# Patient Record
Sex: Female | Born: 1965 | Race: Black or African American | Hispanic: No | Marital: Married | Smoking: Never smoker
Health system: Southern US, Community
[De-identification: ages and names within clinical notes are randomized; demographics above are authoritative.]

## PROBLEM LIST (undated history)

## (undated) DIAGNOSIS — Z9889 Other specified postprocedural states: Secondary | ICD-10-CM

## (undated) DIAGNOSIS — R112 Nausea with vomiting, unspecified: Secondary | ICD-10-CM

## (undated) DIAGNOSIS — M1990 Unspecified osteoarthritis, unspecified site: Secondary | ICD-10-CM

## (undated) DIAGNOSIS — R519 Headache, unspecified: Secondary | ICD-10-CM

## (undated) DIAGNOSIS — F419 Anxiety disorder, unspecified: Secondary | ICD-10-CM

## (undated) DIAGNOSIS — R7303 Prediabetes: Secondary | ICD-10-CM

## (undated) DIAGNOSIS — D759 Disease of blood and blood-forming organs, unspecified: Secondary | ICD-10-CM

## (undated) DIAGNOSIS — I82409 Acute embolism and thrombosis of unspecified deep veins of unspecified lower extremity: Secondary | ICD-10-CM

## (undated) DIAGNOSIS — I2699 Other pulmonary embolism without acute cor pulmonale: Secondary | ICD-10-CM

## (undated) HISTORY — PX: OTHER SURGICAL HISTORY: SHX169

## (undated) HISTORY — PX: IVC FILTER INSERTION: CATH118245

## (undated) HISTORY — PX: CHOLECYSTECTOMY: SHX55

## (undated) HISTORY — PX: WRIST SURGERY: SHX841

---

## 1997-03-24 ENCOUNTER — Inpatient Hospital Stay (HOSPITAL_COMMUNITY): Admission: AD | Admit: 1997-03-24 | Discharge: 1997-03-24 | Payer: Self-pay | Admitting: Obstetrics & Gynecology

## 1997-03-26 ENCOUNTER — Inpatient Hospital Stay (HOSPITAL_COMMUNITY): Admission: AD | Admit: 1997-03-26 | Discharge: 1997-03-28 | Payer: Self-pay | Admitting: Obstetrics and Gynecology

## 1998-12-17 ENCOUNTER — Ambulatory Visit (HOSPITAL_COMMUNITY): Admission: RE | Admit: 1998-12-17 | Discharge: 1998-12-17 | Payer: Self-pay | Admitting: *Deleted

## 1998-12-17 ENCOUNTER — Encounter: Payer: Self-pay | Admitting: *Deleted

## 1999-08-19 ENCOUNTER — Ambulatory Visit (HOSPITAL_COMMUNITY): Admission: RE | Admit: 1999-08-19 | Discharge: 1999-08-19 | Payer: Self-pay | Admitting: *Deleted

## 1999-08-19 ENCOUNTER — Encounter: Payer: Self-pay | Admitting: *Deleted

## 2000-12-12 ENCOUNTER — Other Ambulatory Visit: Admission: RE | Admit: 2000-12-12 | Discharge: 2000-12-12 | Payer: Self-pay | Admitting: Obstetrics and Gynecology

## 2001-12-12 ENCOUNTER — Other Ambulatory Visit: Admission: RE | Admit: 2001-12-12 | Discharge: 2001-12-12 | Payer: Self-pay | Admitting: Obstetrics and Gynecology

## 2002-12-26 ENCOUNTER — Other Ambulatory Visit: Admission: RE | Admit: 2002-12-26 | Discharge: 2002-12-26 | Payer: Self-pay | Admitting: Obstetrics and Gynecology

## 2003-05-17 ENCOUNTER — Ambulatory Visit (HOSPITAL_COMMUNITY): Admission: RE | Admit: 2003-05-17 | Discharge: 2003-05-17 | Payer: Self-pay | Admitting: Obstetrics and Gynecology

## 2003-09-10 ENCOUNTER — Encounter: Admission: RE | Admit: 2003-09-10 | Discharge: 2003-09-10 | Payer: Self-pay | Admitting: Obstetrics and Gynecology

## 2003-10-04 ENCOUNTER — Ambulatory Visit (HOSPITAL_COMMUNITY): Admission: RE | Admit: 2003-10-04 | Discharge: 2003-10-04 | Payer: Self-pay | Admitting: Obstetrics and Gynecology

## 2003-10-15 ENCOUNTER — Inpatient Hospital Stay (HOSPITAL_COMMUNITY): Admission: AD | Admit: 2003-10-15 | Discharge: 2003-10-17 | Payer: Self-pay | Admitting: *Deleted

## 2004-01-07 ENCOUNTER — Other Ambulatory Visit: Admission: RE | Admit: 2004-01-07 | Discharge: 2004-01-07 | Payer: Self-pay | Admitting: Obstetrics and Gynecology

## 2005-02-19 ENCOUNTER — Other Ambulatory Visit: Admission: RE | Admit: 2005-02-19 | Discharge: 2005-02-19 | Payer: Self-pay | Admitting: Obstetrics and Gynecology

## 2006-02-01 HISTORY — PX: CHOLECYSTECTOMY: SHX55

## 2010-01-02 ENCOUNTER — Ambulatory Visit: Payer: Self-pay | Admitting: Obstetrics and Gynecology

## 2010-06-16 NOTE — Assessment & Plan Note (Signed)
Lori Klein, Lori Klein            ACCOUNT NO.:  [id number]   MEDICAL RECORD NO.:  [id number]          PATIENT TYPE:  POB   LOCATION:  [facility] at [place]         FACILITY:  [facility]   PHYSICIAN:  Caren Griffins, CNM       DATE OF BIRTH:  1966/01/21   DATE OF SERVICE:  01/02/2010                                  CLINIC NOTE   REASON FOR VISIT:  Annual GYN exam and Pap.   HISTORY:  This is a 45 year old Caucasian female G2, P2-0-0-2, who is  here for her first visit, wanting a GYN annual exam.  Of note, she plans  to get a complete physical at [facility] later this month.  She has no  problems or concerns and states her last Pap smear was normal done in  2009, and all her Paps have been normal.  She has never had a baseline  mammogram and she is not interested in changing her contraceptive method  which is condoms and rhythm and she is aware of the 80% use  effectiveness rate.  She does not regularly do breast self-exam.  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   ALLERGIES:  None.   MEDICATIONS:  None.   HEALTH CARE MAINTENANCE:  Usual childhood immunizations.  She gets  regular dental exams and care.   MENSTRUAL HISTORY:  LMP December 13, 2009.  Menses 13 x 28 x 6, medium  flow, no dysmenorrhea or metrorrhagia.   CONTRACEPTIVE HISTORY:  She has been on OCPs in the past and does not  want anything other than condoms and rhythm which we discussed, she is  aware of the alternatives.   OB HISTORY:  NSVD x2, ages are 67 and 71, both are healthy.   GYN HISTORY:  Normal Pap.  No STI history.  No endometriosis, fibroids  or ovarian cysts.   PAST MEDICAL HISTORY:  Completely negative.   SURGERIES:  In 2009, she had cholecystectomy, never had a blood  transfusions.   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  Lives with husband and 2 children, in a mutually monogamous  relationship.   Nonsmoker.  Occasional alcohol.  No history of illicit  drug use or abuse.   FAMILY HISTORY:  Mother and maternal grandparents have high blood  pressure.   REVIEW OF SYSTEMS:  Completely negative.  She specifically denies any  dyspareunia, vaginal irritation or discharge, urinary symptoms, or  abdominal pain.   PHYSICAL EXAMINATION:  GENERAL:  WN/WD, in NAD.  VITAL SIGNS:  Pulse 66, BP 111/71, weight 169, height 5 feet 7 inches.  NECK:  Thyroid smooth, nonenlarged.  HEENT:  Normocephalic.  Good dentition.  LUNGS:  CTA bilateral.  HEART:  RRR without murmur noted.  ABDOMEN:  Soft, flat, nontender.  PELVIC:  NEFG.  BUS negative.  Vagina pink, rugated.  Minimal  physiologic discharge.  Cervix clean, multiparous os.  Pap done.  Bimanual, uterus NSSP.  No adnexal tenderness or masses.   ASSESSMENT:  Normal gynecologic exam.   PLAN:  We will schedule a baseline mammogram and call her with results  of the Pap.  Discussed healthy lifestyle, exercise, and suggested a  multivitamin with calcium and vitamin D.  Plan is to return for annual  exams yearly.           ______________________________  Caren Griffins, CNM     DP/MEDQ  D:  01/02/2010  T:  01/02/2010  Job:  [id number]

## 2010-06-19 NOTE — H&P (Signed)
NAME:  Lori Klein, Lori Klein                      ACCOUNT NO.:  [id number]   MEDICAL RECORD NO.:  [id number]                   PATIENT TYPE:  INP   LOCATION:  9198                                 FACILITY:  [facility]   PHYSICIAN:  Janine Limbo, M.D.            DATE OF BIRTH:  1965/08/25   DATE OF ADMISSION:  10/15/2003  DATE OF DISCHARGE:                                HISTORY & PHYSICAL   HISTORY OF PRESENT ILLNESS:  Lori Klein is a 45 year old gravida 2, para 1-  0-0-1 at 39-3/7 weeks, EDD October 20, 2003 by LMP dates, confirmed with  early ultrasound on March 22, 2003.  This patient presents to the office  of [facility] in early active labor.  Her exam is 5-cm dilated, 90% effaced at a  minus 2 station with membranes intact.  She is contracting regularly, every  5 minutes.  The fetal heart rate is in the 150s.  Patient is stable and she  is to be directly admitted to [facility] in labor.  She does not  report any PIH symptoms; no headache, visual changes or epigastric pain.  Her pregnancy has been followed by the C.N.M. service at [facility] and is  remarkable for:  #1 - Advanced maternal age, patient declined amniocentesis  and quad screen; her 18-week ultrasound was within normal.  #2 - Sickle cell  trait.  The patient had had a monthly urine screen and these have all been  within normal limits.  #3 - The patient does have a history of mitral valve  prolapse with mild regurgitation and she does take antibiotics  prophylactically.  #4 - She is group B strep-negative.  This patient entered  prenatal care on March 08, 2003 at approximately 7 weeks' gestation, [facility]  confirmed by early ultrasound at 10 weeks 3 days.  The patient declined any  genetic testing due to advanced maternal age.  She declined amniocentesis.  She had an ultrasound for anatomy at [facility] at 18  weeks which was within normal limits.  Her pregnancy otherwise was  complicated with increased  1-hour glucose challenge; the patient did have 3-  hour GTT performed with 1 elevated value.  She had a fasting blood sugar and  2-hour p.c. on September 06, 2003 at 33-5/7 weeks; fasting blood sugar was 88;  two-hour p.c. was 187.  At that point, the patient underwent nutritional  counseling with [facility] for elevated blood  sugars.  Per notes by [facility] on September 20, 2003 at 35-6/7  weeks' gestation regarding patient's glucose management, she showed 3-hour  GTT with 1 abnormal value, now taking and following diet since September 10, 2003.  The patient's fasting blood sugars have all been within the 80s to  90s, two-hour blood sugars have been mostly less than 120, although past  supper, her blood sugars have been 135 to 150s, therefore glucose  intolerance.  The patient will continue diet and testing, but she may  maintain chair with C.N.M.s.  At 36-5/7 weeks, on September 27, 2003, note  states that all the patient's fasting blood sugars have been less than 90;  three of her 2-hour blood sugars were greater than 120 and those were 131,  122 and 135.  She had an ultrasound for interval growth and size greater  than dates; this was performed on October 04, 2003 at 37 weeks and 5 days  at [facility].  Baby was noted to be in a cephalic  presentation, normal AFI of 15.6 cm, measurements found estimated fetal  weight at 3371, in the 50th to 75th percentile.  On October 04, 2003, at 37-  5/7 weeks, fasting blood sugars 85-95 and 2-hour p.c. blood sugar 84-147,  most between 120-130.  At 38 weeks, when the patient presented to the  office, her fasting blood sugars were between 74 and 76, and her 2-hour  blood sugars between 69 and 142.  She has been slightly size-greater-than-  dates throughout her pregnancy.  She has been normotensive with no  proteinuria.  Monthly Chem-9 for urine screen has been within normal limits.  She has had no  urinary tract infections with this pregnancy.   PERTINENT LABORATORY WORK:  Prenatal lab work on March 22, 2003:  Hemoglobin and hematocrit 13.6 and 39.3, platelets 352,000.  Blood type and  Rh -- B-positive, antibody screen negative.  Sickle cell trait positive.  VDRL nonreactive.  Rubella immune.  Hepatitis B surface antigen negative.  Pap smear normal, November 2004.  GC and Chlamydia were negative with this  pregnancy.  At 28 weeks, 1-hour glucose challenge elevated and described  above.  At 36 weeks, culture of the vaginal tract is negative for group B  strep.   OBSTETRICAL HISTORY:  In 1999, patient had a vacuum-assisted vaginal  delivery; she gave birth to an 8-pound 1-ounce female infant at 82 weeks.  The infant did spend 2 days in the NICU, but does not have any residual  problems.   MEDICAL HISTORY:  Medical history is significant for mitral valve prolapse  with positive echo and mild regurgitation per the patient's report.  Patient  also is noted to have sickle cell trait.   FAMILY HISTORY:  Maternal grandmother with hypertension, patient's sister  with juvenile-onset diabetes, maternal aunt with breast cancer.   GENETIC HISTORY:  Mitral valve prolapse and sickle cell trait in the  patient, otherwise, unremarkable.   SOCIAL HISTORY:  Lori Klein is a married African American female.  She is  45 years old and works in the [employer].  Her husband,  Ray Glacken, is involved and supportive; he is a Designer, industrial/product.  They  do not subscribe to a religious affiliation.   ALLERGIES:  The patient has no known drug allergies.   HABITS:  She denies the use of tobacco, alcohol or illicit drugs.   REVIEW OF SYSTEMS:  Review of systems are as described above.  The patient  is typical of one with a uterine pregnancy at term, in early active labor.  PHYSICAL EXAM:  VITAL SIGNS:  Vital signs stable, afebrile.  The patient's  blood pressure is 110/70.  She  has no proteinuria.  HEENT:  Unremarkable.  HEART:  Regular rate and rhythm.  LUNGS:  Clear.  ABDOMEN:  Abdomen is gravid in its contour.  Uterine fundus is noted to  extend 39 cm above the level  of the pubic symphysis.  Leopold's maneuver  finds the infant to be a longitudinal lie, cephalic presentation and the  estimated fetal weight is 8 pounds.  Doppler:  Fetal heart tones are in the  150s.  PELVIC:  Digital exam of the cervix finds it to be 5-cm dilated, 90% effaced  with the cephalic presenting part at a -2 station and membranes intact.  EXTREMITIES:  Extremities show no pathologic edema.  DTR are 1+ with no  clonus.   ASSESSMENT:  1.  Intrauterine pregnancy at term, in early active labor.  2.  Mitral valve prolapse with mild regurgitation.  3.  Glucose intolerance.     Rica Koyanagi, C.N.M.               Janine Limbo, M.D.    SDM/MEDQ  D:  10/15/2003  T:  10/15/2003  Job:  [id number]

## 2014-06-18 ENCOUNTER — Ambulatory Visit: Payer: Self-pay | Admitting: Obstetrics & Gynecology

## 2015-02-02 HISTORY — PX: OTHER SURGICAL HISTORY: SHX169

## 2015-04-15 ENCOUNTER — Emergency Department (HOSPITAL_COMMUNITY)
Admission: EM | Admit: 2015-04-15 | Discharge: 2015-04-15 | Disposition: A | Discharge status: Home / Self Care | Payer: Federal, State, Local not specified - PPO | Attending: Emergency Medicine | Admitting: Emergency Medicine

## 2015-04-15 ENCOUNTER — Encounter (HOSPITAL_COMMUNITY): Payer: Self-pay

## 2015-04-15 DIAGNOSIS — Y9389 Activity, other specified: Secondary | ICD-10-CM | POA: Insufficient documentation

## 2015-04-15 DIAGNOSIS — M62838 Other muscle spasm: Secondary | ICD-10-CM

## 2015-04-15 DIAGNOSIS — S161XXA Strain of muscle, fascia and tendon at neck level, initial encounter: Secondary | ICD-10-CM | POA: Diagnosis not present

## 2015-04-15 DIAGNOSIS — Y92410 Unspecified street and highway as the place of occurrence of the external cause: Secondary | ICD-10-CM | POA: Diagnosis not present

## 2015-04-15 DIAGNOSIS — T148XXA Other injury of unspecified body region, initial encounter: Secondary | ICD-10-CM

## 2015-04-15 DIAGNOSIS — M542 Cervicalgia: Secondary | ICD-10-CM

## 2015-04-15 DIAGNOSIS — Z79899 Other long term (current) drug therapy: Secondary | ICD-10-CM | POA: Diagnosis not present

## 2015-04-15 DIAGNOSIS — Y998 Other external cause status: Secondary | ICD-10-CM | POA: Diagnosis not present

## 2015-04-15 DIAGNOSIS — S199XXA Unspecified injury of neck, initial encounter: Secondary | ICD-10-CM | POA: Diagnosis present

## 2015-04-15 MED ORDER — NAPROXEN 500 MG PO TABS: 500.0000 mg | ORAL_TABLET | Freq: Two times a day (BID) | ORAL | Status: DC | PRN

## 2015-04-15 MED ORDER — CYCLOBENZAPRINE HCL 10 MG PO TABS: 10.0000 mg | ORAL_TABLET | Freq: Three times a day (TID) | ORAL | Status: DC | PRN
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
## 2015-09-26 ENCOUNTER — Encounter (HOSPITAL_COMMUNITY): Payer: Self-pay | Admitting: Emergency Medicine

## 2015-09-26 ENCOUNTER — Emergency Department (HOSPITAL_COMMUNITY)
Admit: 2015-09-26 | Discharge: 2015-09-26 | Disposition: A | Discharge status: Home / Self Care | Payer: Federal, State, Local not specified - PPO

## 2015-09-26 ENCOUNTER — Emergency Department (HOSPITAL_COMMUNITY): Payer: Federal, State, Local not specified - PPO

## 2015-09-26 ENCOUNTER — Inpatient Hospital Stay (HOSPITAL_COMMUNITY)
Admission: EM | Admit: 2015-09-26 | Discharge: 2015-09-27 | DRG: 299 | Disposition: A | Discharge status: Home / Self Care | Payer: Federal, State, Local not specified - PPO | Attending: Internal Medicine | Admitting: Internal Medicine

## 2015-09-26 DIAGNOSIS — Z79899 Other long term (current) drug therapy: Secondary | ICD-10-CM

## 2015-09-26 DIAGNOSIS — M7989 Other specified soft tissue disorders: Secondary | ICD-10-CM | POA: Diagnosis not present

## 2015-09-26 DIAGNOSIS — I82441 Acute embolism and thrombosis of right tibial vein: Secondary | ICD-10-CM | POA: Diagnosis present

## 2015-09-26 DIAGNOSIS — I82401 Acute embolism and thrombosis of unspecified deep veins of right lower extremity: Secondary | ICD-10-CM | POA: Diagnosis present

## 2015-09-26 DIAGNOSIS — I82431 Acute embolism and thrombosis of right popliteal vein: Principal | ICD-10-CM | POA: Diagnosis present

## 2015-09-26 DIAGNOSIS — I2699 Other pulmonary embolism without acute cor pulmonale: Secondary | ICD-10-CM

## 2015-09-26 DIAGNOSIS — Z793 Long term (current) use of hormonal contraceptives: Secondary | ICD-10-CM

## 2015-09-26 DIAGNOSIS — I82491 Acute embolism and thrombosis of other specified deep vein of right lower extremity: Secondary | ICD-10-CM | POA: Diagnosis present

## 2015-09-26 LAB — CBC WITH DIFFERENTIAL/PLATELET
Basophils Absolute: 0 10*3/uL (ref 0.0–0.1)
Basophils Relative: 0 %
Eosinophils Absolute: 0.2 10*3/uL (ref 0.0–0.7)
Eosinophils Relative: 2 %
HCT: 37.3 % (ref 36.0–46.0)
Hemoglobin: 13.1 g/dL (ref 12.0–15.0)
Lymphocytes Relative: 23 %
Lymphs Abs: 2.4 10*3/uL (ref 0.7–4.0)
MCH: 29.1 pg (ref 26.0–34.0)
MCHC: 35.1 g/dL (ref 30.0–36.0)
MCV: 82.9 fL (ref 78.0–100.0)
Monocytes Absolute: 0.6 10*3/uL (ref 0.1–1.0)
Monocytes Relative: 6 %
Neutro Abs: 7.1 10*3/uL (ref 1.7–7.7)
Neutrophils Relative %: 69 %
Platelets: 366 10*3/uL (ref 150–400)
RBC: 4.5 MIL/uL (ref 3.87–5.11)
RDW: 12.4 % (ref 11.5–15.5)
WBC: 10.3 10*3/uL (ref 4.0–10.5)

## 2015-09-26 LAB — URINALYSIS, ROUTINE W REFLEX MICROSCOPIC
Bilirubin Urine: NEGATIVE
Glucose, UA: NEGATIVE mg/dL
Ketones, ur: NEGATIVE mg/dL
Leukocytes, UA: NEGATIVE
Nitrite: NEGATIVE
Protein, ur: NEGATIVE mg/dL
Specific Gravity, Urine: 1.01 (ref 1.005–1.030)
pH: 6 (ref 5.0–8.0)

## 2015-09-26 LAB — COMPREHENSIVE METABOLIC PANEL
ALT: 19 U/L (ref 14–54)
AST: 23 U/L (ref 15–41)
Albumin: 3.6 g/dL (ref 3.5–5.0)
Alkaline Phosphatase: 38 U/L (ref 38–126)
Anion gap: 4 — ABNORMAL LOW (ref 5–15)
BUN: 14 mg/dL (ref 6–20)
CO2: 27 mmol/L (ref 22–32)
Calcium: 9.1 mg/dL (ref 8.9–10.3)
Chloride: 108 mmol/L (ref 101–111)
Creatinine, Ser: 0.97 mg/dL (ref 0.44–1.00)
GFR calc Af Amer: >60 mL/min (ref >60)
GFR calc non Af Amer: >60 mL/min (ref >60)
Glucose, Bld: 105 mg/dL — ABNORMAL HIGH (ref 65–99)
Potassium: 3.9 mmol/L (ref 3.5–5.1)
Sodium: 139 mmol/L (ref 135–145)
Total Bilirubin: 0.4 mg/dL (ref 0.3–1.2)
Total Protein: 7.2 g/dL (ref 6.5–8.1)

## 2015-09-26 LAB — PROTIME-INR
INR: 1.16
PROTHROMBIN TIME: 14.9 s (ref 11.4–15.2)

## 2015-09-26 LAB — URINE MICROSCOPIC-ADD ON

## 2015-09-26 LAB — PREGNANCY, URINE: Preg Test, Ur: NEGATIVE

## 2015-09-26 LAB — TROPONIN I

## 2015-09-26 LAB — APTT: aPTT: 30 seconds (ref 24–36)

## 2015-09-26 MED ORDER — IOPAMIDOL (ISOVUE-370) INJECTION 76%
100.0000 mL | Freq: Once | INTRAVENOUS | Status: AC | PRN
  Administered 2015-09-26: 100 mL via INTRAVENOUS

## 2015-09-26 MED ORDER — HEPARIN (PORCINE) IN NACL 100-0.45 UNIT/ML-% IJ SOLN
1200.0000 [IU]/h | INTRAMUSCULAR | Status: DC
  Administered 2015-09-26: 1200 [IU]/h via INTRAVENOUS
  Filled 2015-09-26: qty 250

## 2015-09-26 MED ORDER — HEPARIN BOLUS VIA INFUSION
2000.0000 [IU] | Freq: Once | INTRAVENOUS | Status: AC
  Administered 2015-09-26: 2000 [IU] via INTRAVENOUS
  Filled 2015-09-26: qty 2000

## 2015-09-26 NOTE — Progress Notes (Addendum)
Pt states pcp is Yevonne Paxlison [facility] EP[facility] updated

## 2015-09-26 NOTE — ED Provider Notes (Signed)
[facility]-EMERGENCY DEPT Provider Note   CSN: [id number] Arrival date & time: 09/26/15  1556     History   Chief Complaint Chief Complaint  Patient presents with  . Leg Swelling    HPI Lori Klein is a 50 y.o. female.  HPI Patient presents to the emergency department with right lower leg swelling.  She was seen by her primary care doctor who sent her to the emergency department for further evaluation of possible DVT.  The patient states that she does take an oral contraceptive pill.  She states she does not have any chronic medical problems.  Patient states that the swelling is been progressing over the last 3 weeks.  She does have mid calf tenderness.  The patient states that she did not take any medications prior to arrivalThe patient denies chest pain, shortness of breath, headache,blurred vision, neck pain, fever, cough, weakness, numbness, dizziness, anorexia, abdominal pain, nausea, vomiting, diarrhea, rash, back pain, dysuria, hematemesis, bloody stool, near syncope, or syncope. History reviewed. No pertinent past medical history.  There are no active problems to display for this patient.   Past Surgical History:  Procedure Laterality Date  . CHOLECYSTECTOMY    . ulnar surgery      OB History    No data available       Home Medications    Prior to Admission medications   Medication Sig Start Date End Date Taking? Authorizing Provider  Biotin 10 MG CAPS Take 10 mg by mouth daily.   Yes Historical Provider, MD  Multiple Vitamin (MULTIVITAMIN WITH MINERALS) TABS tablet Take 1 tablet by mouth daily.   Yes Historical Provider, MD  [facility] IvRMELLA 1/35 tablet Take 1 tablet by mouth daily. 04/14/15  Yes Historical Provider, MD  pyridOXINE (VITAMIN B-6) 100 MG tablet Take 100 mg by mouth daily.   Yes Historical Provider, MD  vitamin B-12 (CYANOCOBALAMIN) 1000 MCG tablet Take 1,000 mcg by mouth daily.   Yes Historical Provider, MD  cyclobenzaprine (FLEXERIL) 10 MG tablet Take 1  tablet (10 mg total) by mouth 3 (three) times daily as needed for muscle spasms. Patient not taking: Reported on 09/26/2015 04/15/15   Mercedes Camprubi-Soms, PA-C  naproxen (NAPROSYN) 500 MG tablet Take 1 tablet (500 mg total) by mouth 2 (two) times daily as needed for mild pain, moderate pain or headache (TAKE WITH MEALS.). Patient not taking: Reported on 09/26/2015 04/15/15   Mercedes Camprubi-Soms, PA-C    Family History History reviewed. No pertinent family history.  Social History Social History  Substance Use Topics  . Smoking status: Not on file  . Smokeless tobacco: Not on file  . Alcohol use Yes     Comment: occasionally     Allergies   Review of patient's allergies indicates no known allergies.   Review of Systems Review of Systems All other systems negative except as documented in the HPI. All pertinent positives and negatives as reviewed in the HPI.  Physical Exam Updated Vital Signs BP 113/78 (BP Location: Left Arm)   Pulse 73   Temp 98.2 F (36.8 C) (Oral)   Resp 16   LMP 09/09/2015   SpO2 100%   Physical Exam  Constitutional: She is oriented to person, place, and time. She appears well-developed and well-nourished. No distress.  HENT:  Head: Normocephalic and atraumatic.  Mouth/Throat: Oropharynx is clear and moist.  Eyes: Pupils are equal, round, and reactive to light.  Neck: Normal range of motion. Neck supple.  Cardiovascular: Normal rate, regular rhythm and  normal heart sounds.  Exam reveals no gallop and no friction rub.   No murmur heard. Pulmonary/Chest: Effort normal and breath sounds normal. No respiratory distress. She has no wheezes.  Abdominal: Soft. Bowel sounds are normal. She exhibits no distension. There is no tenderness.  Musculoskeletal: She exhibits edema.       Right lower leg: She exhibits tenderness, swelling and edema. She exhibits no bony tenderness, no deformity and no laceration.  Neurological: She is alert and oriented to  person, place, and time. She exhibits normal muscle tone. Coordination normal.  Skin: Skin is warm and dry. No rash noted. No erythema.  Psychiatric: She has a normal mood and affect. Her behavior is normal.  Nursing note and vitals reviewed.    ED Treatments / Results  Labs (all labs ordered are listed, but only abnormal results are displayed) Labs Reviewed  COMPREHENSIVE METABOLIC PANEL - Abnormal; Notable for the following:       Result Value   Glucose, Bld 105 (*)    Anion gap 4 (*)    All other components within normal limits  URINALYSIS, ROUTINE W REFLEX MICROSCOPIC (NOT AT [facility]) - Abnormal; Notable for the following:    Hgb urine dipstick SMALL (*)    All other components within normal limits  URINE MICROSCOPIC-ADD ON - Abnormal; Notable for the following:    Squamous Epithelial / LPF 0-5 (*)    Bacteria, UA RARE (*)    All other components within normal limits  CBC WITH DIFFERENTIAL/PLATELET  PREGNANCY, URINE    EKG  EKG Interpretation None       Radiology Dg Chest 2 View  Result Date: 09/26/2015 CLINICAL DATA:  Bilateral leg swelling off and on for 2 weeks. EXAM: CHEST  2 VIEW COMPARISON:  None. FINDINGS: Heart and mediastinal contours are within normal limits. No focal opacities or effusions. No acute bony abnormality. IMPRESSION: No active cardiopulmonary disease. Electronically Signed   By: Charlett Nose M.D.   On: 09/26/2015 16:57    Procedures Procedures (including critical care time)  Medications Ordered in ED Medications - No data to display   Initial Impression / Assessment and Plan / ED Course  I have reviewed the triage vital signs and the nursing notes.  Pertinent labs & imaging results that were available during my care of the patient were reviewed by me and considered in my medical decision making (see chart for details).  Clinical Course    Patient does have a DVT noted in the right leg.  The patient will have a CT scan to further assess  for any PE.  Patient does have bilateral PEs and will be treating with heparin.  Patient is advised of the results and all questions were answered  Final Clinical Impressions(s) / ED Diagnoses   Final diagnoses:  None    New Prescriptions New Prescriptions   No medications on file     Charlestine Night, PA-C 09/26/15 2043    Charlestine Night, PA-C 09/26/15 2132    Samuel Jester, DO 09/27/15 1547

## 2015-09-26 NOTE — H&P (Signed)
Lori Klein ZOX:[id number] DOB: [birth year] DOA: 09/26/2015     PCP: Christ Kick, PA   Outpatient Specialists: none Patient coming from:    home Lives   With family    Chief Complaint: Leg pain  HPI: Lori Klein is a 50 y.o. female with medical history significant of no significant past medical history    Presented with right intermittent lower leg pain and swelling for the past week she was evaluated for this by her primary care provider and was sent to emergency department for evaluation of DVT. Patient denies any chest pain or shortness of breath has not had any recent right has no history of cancer she is on oral contraceptives denies any fevers or chills no lightheadedness no nausea vomiting no diarrhea no rash no blood in stool no black stools no syncope. No recent travel she did travel in beginning of July but has been symptomatic only for the past one week prior. Reports last mammogram was in November and was normal and no history of cancer no family history of blood clots     IN ER:  Temp (24hrs), Avg:98.2 F (36.8 C), Min:98.2 F (36.8 C), Max:98.2 F (36.8 C)  Heart rate 78 blood pressure 140/91   Doppler showed right femoral vein popliteal vein test chronic veins posterior tibial veins and peroneal veins DVT CT scan showed bilateral pulmonary embolism Following Medications were ordered in ER: Medications  heparin bolus via infusion 2,000 Units (not administered)  heparin ADULT infusion 100 units/mL (25000 units/286mL sodium chloride 0.45%) (not administered)  iopamidol (ISOVUE-370) 76 % injection 100 mL (100 mLs Intravenous Contrast Given 09/26/15 2043)      Hospitalist was called for admission for DVT and PE  Review of Systems:    Pertinent positives include: Right leg swelling and pain  Constitutional:  No weight loss, night sweats, Fevers, chills, fatigue, weight loss  HEENT:  No headaches, Difficulty swallowing,Tooth/dental problems,Sore  throat,  No sneezing, itching, ear ache, nasal congestion, post nasal drip,  Cardio-vascular:  No chest pain, Orthopnea, PND, anasarca, dizziness, palpitations.no Bilateral lower extremity swelling  GI:  No heartburn, indigestion, abdominal pain, nausea, vomiting, diarrhea, change in bowel habits, loss of appetite, melena, blood in stool, hematemesis Resp:  no shortness of breath at rest. No dyspnea on exertion, No excess mucus, no productive cough, No non-productive cough, No coughing up of blood.No change in color of mucus.No wheezing. Skin:  no rash or lesions. No jaundice GU:  no dysuria, change in color of urine, no urgency or frequency. No straining to urinate.  No flank pain.  Musculoskeletal:  No joint pain or no joint swelling. No decreased range of motion. No back pain.  Psych:  No change in mood or affect. No depression or anxiety. No memory loss.  Neuro: no localizing neurological complaints, no tingling, no weakness, no double vision, no gait abnormality, no slurred speech, no confusion  As per HPI otherwise 10 point review of systems negative.   Past Medical History: History reviewed. No pertinent past medical history. Past Surgical History:  Procedure Laterality Date  . CHOLECYSTECTOMY    . ulnar surgery       Social History:  Ambulatory   Independently     reports that she drinks alcohol. Her tobacco and drug histories are not on file.  Allergies:  No Known Allergies     Family History:   Family History  Problem Relation Age of Onset  . Hypertension Mother   .  Hypertension Father   . Diabetes Other     Medications: Prior to Admission medications   Medication Sig Start Date End Date Taking? Authorizing Provider  Biotin 10 MG CAPS Take 10 mg by mouth daily.   Yes Historical Provider, MD  Multiple Vitamin (MULTIVITAMIN WITH MINERALS) TABS tablet Take 1 tablet by mouth daily.   Yes Historical Provider, MD  [facility] Take 1 tablet by mouth  daily. 04/14/15  Yes Historical Provider, MD  pyridOXINE (VITAMIN B-6) 100 MG tablet Take 100 mg by mouth daily.   Yes Historical Provider, MD  vitamin B-12 (CYANOCOBALAMIN) 1000 MCG tablet Take 1,000 mcg by mouth daily.   Yes Historical Provider, MD  cyclobenzaprine (FLEXERIL) 10 MG tablet Take 1 tablet (10 mg total) by mouth 3 (three) times daily as needed for muscle spasms. Patient not taking: Reported on 09/26/2015 04/15/15   Mercedes Camprubi-Soms, PA-C  naproxen (NAPROSYN) 500 MG tablet Take 1 tablet (500 mg total) by mouth 2 (two) times daily as needed for mild pain, moderate pain or headache (TAKE WITH MEALS.). Patient not taking: Reported on 09/26/2015 04/15/15   Allen DerryMercedes Camprubi-Soms, PA-C    Physical Exam: Patient Vitals for the past 24 hrs:  BP Temp Temp src Pulse Resp SpO2 Height Weight  09/26/15 2131 - - - - - - 5\' 7"  (1.702 m) 71.2 kg (157 lb)  09/26/15 1929 113/78 - - 73 16 100 % - -  09/26/15 1611 140/91 98.2 F (36.8 C) Oral 78 16 99 % - -    1. General:  in No Acute distress 2. Psychological: Alert and Oriented 3. Head/ENT:    Dry Mucous Membranes                          Head Non traumatic, neck supple                          Normal   Dentition 4. SKIN: normal  Skin turgor,  Skin clean Dry and intact no rash 5. Heart: Regular rate and rhythm no Murmur, Rub or gallop 6. Lungs:  Clear to auscultation bilaterally, no wheezes or crackles   7. Abdomen: Soft,  non-tender, Non distended 8. Lower extremities: no clubbing, cyanosis, right leg edema   9. Neurologically Grossly intact, moving all 4 extremities equally  10. MSK: Normal range of motion   body mass index is 24.59 kg/m.  Labs on Admission:   Labs on Admission: I have personally reviewed following labs and imaging studies  CBC:  Recent Labs Lab 09/26/15 1708  WBC 10.3  NEUTROABS 7.1  HGB 13.1  HCT 37.3  MCV 82.9  PLT 366   Basic Metabolic Panel:  Recent Labs Lab 09/26/15 1708  NA 139  K 3.9    CL 108  CO2 27  GLUCOSE 105*  BUN 14  CREATININE 0.97  CALCIUM 9.1   GFR: Estimated Creatinine Clearance: 69 mL/min (by C-G formula based on SCr of 0.97 mg/dL). Liver Function Tests:  Recent Labs Lab 09/26/15 1708  AST 23  ALT 19  ALKPHOS 38  BILITOT 0.4  PROT 7.2  ALBUMIN 3.6   No results for input(s): LIPASE, AMYLASE in the last 168 hours. No results for input(s): AMMONIA in the last 168 hours. Coagulation Profile: No results for input(s): INR, PROTIME in the last 168 hours. Cardiac Enzymes: No results for input(s): CKTOTAL, CKMB, CKMBINDEX, TROPONINI in the last 168 hours.  BNP (last 3 results) No results for input(s): PROBNP in the last 8760 hours. HbA1C: No results for input(s): HGBA1C in the last 72 hours. CBG: No results for input(s): GLUCAP in the last 168 hours. Lipid Profile: No results for input(s): CHOL, HDL, LDLCALC, TRIG, CHOLHDL, LDLDIRECT in the last 72 hours. Thyroid Function Tests: No results for input(s): TSH, T4TOTAL, FREET4, T3FREE, THYROIDAB in the last 72 hours. Anemia Panel: No results for input(s): VITAMINB12, FOLATE, FERRITIN, TIBC, IRON, RETICCTPCT in the last 72 hours. Urine analysis:    Component Value Date/Time   COLORURINE YELLOW 09/26/2015 1843   APPEARANCEUR CLEAR 09/26/2015 1843   LABSPEC 1.010 09/26/2015 1843   PHURINE 6.0 09/26/2015 1843   GLUCOSEU NEGATIVE 09/26/2015 1843   HGBUR SMALL (A) 09/26/2015 1843   BILIRUBINUR NEGATIVE 09/26/2015 1843   KETONESUR NEGATIVE 09/26/2015 1843   PROTEINUR NEGATIVE 09/26/2015 1843   NITRITE NEGATIVE 09/26/2015 1843   LEUKOCYTESUR NEGATIVE 09/26/2015 1843   Sepsis Labs: @LABRCNTIP (procalcitonin:4,lacticidven:4) )No results found for this or any previous visit (from the past 240 hour(s)).     UA   no evidence of UTI    No results found for: HGBA1C  Estimated Creatinine Clearance: 69 mL/min (by C-G formula based on SCr of 0.97 mg/dL).  BNP (last 3 results) No results for input(s):  PROBNP in the last 8760 hours.   ECG REPORT ordered  [facility] Weights   09/26/15 2131  Weight: 71.2 kg (157 lb)     Cultures: No results found for: SDES, SPECREQUEST, CULT, REPTSTATUS   Radiological Exams on Admission: Dg Chest 2 View  Result Date: 09/26/2015 CLINICAL DATA:  Bilateral leg swelling off and on for 2 weeks. EXAM: CHEST  2 VIEW COMPARISON:  None. FINDINGS: Heart and mediastinal contours are within normal limits. No focal opacities or effusions. No acute bony abnormality. IMPRESSION: No active cardiopulmonary disease. Electronically Signed   By: Charlett Nose M.D.   On: 09/26/2015 16:57   Ct Angio Chest Pe W And/or Wo Contrast  Result Date: 09/26/2015 CLINICAL DATA:  Short of breath, RIGHT lower extremity swelling. EXAM: CT ANGIOGRAPHY CHEST WITH CONTRAST TECHNIQUE: Multidetector CT imaging of the chest was performed using the standard protocol during bolus administration of intravenous contrast. Multiplanar CT image reconstructions and MIPs were obtained to evaluate the vascular anatomy. CONTRAST:  100 mL Isovue COMPARISON:  None. FINDINGS: Cardiovascular: Tubular filling defects within the proximal and segmental branches of the RIGHT lower lobe pulmonary arteries consistent pulmonary emboli. These emboli are partially occlusive. Smaller tubular filling defects within the LEFT lower lobe pulmonary arteries which are also partially occlusive. Small lingular embolism noted. Small RIGHT upper lobe emboli additionally. The RIGHT ventricular ratio to LEFT ventricular ratio muscle is less than 1 inconsistent with RIGHT heart strain. Mediastinum/Nodes: No axillary or supraclavicular lymphadenopathy. No mediastinal adenopathy. Lungs/Pleura: no pulmonary infarction. Upper Abdomen: Limited view of the liver, kidneys, pancreas are unremarkable. Normal adrenal glands. Musculoskeletal: No aggressive osseous lesion. Review of the MIP images confirms the above findings. IMPRESSION: 1. Bilateral lower  lobe acute pulmonary emboli. The overall clot burden is moderate. Emboli are more occlusive in the RIGHT lower lobe than LEFT lower lobe. 2. No CT evidence of RIGHT ventricular strain. Critical Value/emergent results were called by telephone at the time of interpretation on 09/26/2015 at 9:22 pm to Dr. Charlestine Night , who verbally acknowledged these results. Electronically Signed   By: Genevive Bi M.D.   On: 09/26/2015 21:23    Chart has been reviewed  Assessment/Plan  50 y.o. female with medical history significant of no significant past medical history on oral contraceptives being admitted for DVT complicated by bilateral PV sat evidence of hemodynamic instability  Present on Admission: . Pulmonary embolism (HCC) in a setting of birth control use. Obtain echo and a shave heparin drip switched to appropriate newer anticoagulation agents ordered case manager consult . DVT (deep venous thrombosis) (HCC) - we'll need to discontinue oral contraception and will need to be on anticoagulation for this 3 months     Other plan as per orders.  DVT prophylaxis:  Heparin  Code Status:  FULL CODE   per patient    Family Communication:   Family    at  Bedside  plan of care was discussed with  Husband Lori Klein [phone], Mother Lori Klein [phone]  Disposition Plan:   To home once workup is complete and patient is stable     Consults called: none    Admission status:   Inpatient    Level of care       tele      I have spent a total of on this admission    , 09/27/2015, 12:39 AM    [facility]  Pager [phone]   after 2 AM please page floor coverage PA If 7AM-7PM, please contact the day team taking care of the patient  [email or web address]  Password [email or web address]

## 2015-09-26 NOTE — ED Triage Notes (Signed)
Pt reports intermittent R lower leg swelling for the past week. Pain with flexion only. Was sent here by PCP to rule out DVT. No sob/cp.

## 2015-09-26 NOTE — Progress Notes (Signed)
ANTICOAGULATION CONSULT NOTE - Initial Consult  Pharmacy Consult for IV heparin Indication: PE/DVT  No Known Allergies  Patient Measurements: Height: 5\' 7"  (170.2 cm) Weight: 157 lb (71.2 kg) IBW/kg (Calculated) : 61.6 Heparin Dosing Weight: TBW  Vital Signs: Temp: 98.2 F (36.8 C) (08/25 1611) Temp Source: Oral (08/25 1611) BP: 113/78 (08/25 1929) Pulse Rate: 73 (08/25 1929)  Labs:  Recent Labs  09/26/15 1708  HGB 13.1  HCT 37.3  PLT 366  CREATININE 0.97    Estimated Creatinine Clearance: 69 mL/min (by C-G formula based on SCr of 0.97 mg/dL).   Medical History: History reviewed. No pertinent past medical history.  Assessment: [age] yoF with no PMH but on oral contraceptive presents with RLE swelling; found to have extensive DVT and PE   Baseline INR, aPTT: pending  Prior anticoagulation: none  Significant events:  Today, 09/26/2015:  CBC: wnl  No bleeding or infusion issues per nursing  CrCl: 69 ml/min  Goal of Therapy: Heparin level 0.3-0.7 units/ml Monitor platelets by anticoagulation protocol: Yes  Plan:  Heparin 2000 units IV bolus x 1  Heparin 1200 units/hr IV infusion  Check heparin level 6 hrs after start  Daily CBC, daily heparin level once stable  Monitor for signs of bleeding or thrombosis   Bernadene Personrew , PharmD Pager: [phone] 09/26/2015, 9:45 PM

## 2015-09-26 NOTE — Progress Notes (Signed)
*  PRELIMINARY RESULTS* Vascular Ultrasound Lower extremity venous duplex has been completed.  Preliminary findings:  DVT noted in the Right femoral vein, popliteal vein, gastroc veins, posterior tibial veins, and peroneal veins.  Per protocol, LLE duplex was also performed, with no evidence of DVT. Left baker's cyst noted.   Gave results to student working with Dr. Clarene DukeMcManus.   Farrel DemarkJill Eunice, RDMS, RVT  09/26/2015, 6:08 PM

## 2015-09-27 ENCOUNTER — Encounter (HOSPITAL_COMMUNITY): Payer: Self-pay | Admitting: Internal Medicine

## 2015-09-27 ENCOUNTER — Inpatient Hospital Stay (HOSPITAL_COMMUNITY): Payer: Federal, State, Local not specified - PPO

## 2015-09-27 DIAGNOSIS — I82491 Acute embolism and thrombosis of other specified deep vein of right lower extremity: Secondary | ICD-10-CM | POA: Diagnosis present

## 2015-09-27 DIAGNOSIS — Z793 Long term (current) use of hormonal contraceptives: Secondary | ICD-10-CM | POA: Diagnosis not present

## 2015-09-27 DIAGNOSIS — M7989 Other specified soft tissue disorders: Secondary | ICD-10-CM | POA: Diagnosis present

## 2015-09-27 DIAGNOSIS — I2699 Other pulmonary embolism without acute cor pulmonale: Secondary | ICD-10-CM

## 2015-09-27 DIAGNOSIS — Z79899 Other long term (current) drug therapy: Secondary | ICD-10-CM | POA: Diagnosis not present

## 2015-09-27 DIAGNOSIS — I82401 Acute embolism and thrombosis of unspecified deep veins of right lower extremity: Secondary | ICD-10-CM | POA: Diagnosis not present

## 2015-09-27 DIAGNOSIS — I82441 Acute embolism and thrombosis of right tibial vein: Secondary | ICD-10-CM | POA: Diagnosis present

## 2015-09-27 DIAGNOSIS — I82431 Acute embolism and thrombosis of right popliteal vein: Secondary | ICD-10-CM | POA: Diagnosis present

## 2015-09-27 LAB — CBC
HEMATOCRIT: 37.1 % (ref 36.0–46.0)
HEMOGLOBIN: 12.9 g/dL (ref 12.0–15.0)
MCH: 28.9 pg (ref 26.0–34.0)
MCHC: 34.8 g/dL (ref 30.0–36.0)
MCV: 83.2 fL (ref 78.0–100.0)
Platelets: 373 10*3/uL (ref 150–400)
RBC: 4.46 MIL/uL (ref 3.87–5.11)
RDW: 12.5 % (ref 11.5–15.5)
WBC: 9 10*3/uL (ref 4.0–10.5)

## 2015-09-27 LAB — COMPREHENSIVE METABOLIC PANEL
ALK PHOS: 37 U/L — AB (ref 38–126)
ALT: 18 U/L (ref 14–54)
AST: 21 U/L (ref 15–41)
Albumin: 3.3 g/dL — ABNORMAL LOW (ref 3.5–5.0)
Anion gap: 6 (ref 5–15)
BILIRUBIN TOTAL: 0.6 mg/dL (ref 0.3–1.2)
BUN: 12 mg/dL (ref 6–20)
CALCIUM: 8.7 mg/dL — AB (ref 8.9–10.3)
CO2: 26 mmol/L (ref 22–32)
CREATININE: 0.85 mg/dL (ref 0.44–1.00)
Chloride: 110 mmol/L (ref 101–111)
GFR calc Af Amer: >60 mL/min (ref >60)
GFR calc non Af Amer: >60 mL/min (ref >60)
GLUCOSE: 101 mg/dL — AB (ref 65–99)
Potassium: 3.5 mmol/L (ref 3.5–5.1)
SODIUM: 142 mmol/L (ref 135–145)
TOTAL PROTEIN: 6.7 g/dL (ref 6.5–8.1)

## 2015-09-27 LAB — MAGNESIUM: MAGNESIUM: 2 mg/dL (ref 1.7–2.4)

## 2015-09-27 LAB — HEPARIN LEVEL (UNFRACTIONATED): HEPARIN UNFRACTIONATED: 0.53 [IU]/mL (ref 0.30–0.70)

## 2015-09-27 LAB — TSH: TSH: 3.679 u[IU]/mL (ref 0.350–4.500)

## 2015-09-27 LAB — PHOSPHORUS: Phosphorus: 3.6 mg/dL (ref 2.5–4.6)

## 2015-09-27 MED ORDER — ACETAMINOPHEN 325 MG PO TABS: 650.0000 mg | ORAL_TABLET | Freq: Four times a day (QID) | ORAL | Status: DC | PRN

## 2015-09-27 MED ORDER — APIXABAN 5 MG PO TABS: 5.0000 mg | ORAL_TABLET | Freq: Two times a day (BID) | ORAL | 0 refills | Status: DC

## 2015-09-27 MED ORDER — APIXABAN 5 MG PO TABS
10.0000 mg | ORAL_TABLET | Freq: Two times a day (BID) | ORAL | Status: DC
  Administered 2015-09-27: 10 mg via ORAL
  Filled 2015-09-27: qty 2

## 2015-09-27 MED ORDER — ONDANSETRON HCL 4 MG/2ML IJ SOLN: 4.0000 mg | Freq: Four times a day (QID) | INTRAMUSCULAR | Status: DC | PRN

## 2015-09-27 MED ORDER — ONDANSETRON HCL 4 MG PO TABS: 4.0000 mg | ORAL_TABLET | Freq: Four times a day (QID) | ORAL | Status: DC | PRN

## 2015-09-27 MED ORDER — APIXABAN 5 MG PO TABS: 5.0000 mg | ORAL_TABLET | Freq: Two times a day (BID) | ORAL | Status: DC

## 2015-09-27 MED ORDER — APIXABAN 5 MG PO TABS
10.0000 mg | ORAL_TABLET | Freq: Two times a day (BID) | ORAL | 0 refills | Status: DC
Start: 2015-09-27 — End: 2019-05-31

## 2015-09-27 MED ORDER — SODIUM CHLORIDE 0.9% FLUSH
3.0000 mL | Freq: Two times a day (BID) | INTRAVENOUS | Status: DC
  Administered 2015-09-27 (×2): 3 mL via INTRAVENOUS

## 2015-09-27 MED ORDER — HYDROCODONE-ACETAMINOPHEN 5-325 MG PO TABS: 1.0000 | ORAL_TABLET | ORAL | Status: DC | PRN

## 2015-09-27 MED ORDER — ACETAMINOPHEN 650 MG RE SUPP: 650.0000 mg | Freq: Four times a day (QID) | RECTAL | Status: DC | PRN

## 2015-09-27 NOTE — Care Management Note (Signed)
Case Management Note  Patient Details  Name: Lori Klein MRN: [id number] Date of Birth: [birth year]  Subjective/Objective:                  PE/DVT Action/Plan: Discharge planning  Expected Discharge Date:  09/27/15              Expected Discharge Plan:  Home/Self Care  In-House Referral:     Discharge planning Services  CM Consult  Post Acute Care Choice:    Choice offered to:  Patient  DME Arranged:    DME Agency:     HH Arranged:    HH Agency:     Status of Service:  Completed, signed off  If discussed at [employer] Length of Stay Meetings, dates discussed:    Additional Comments: CM spoke with pt (remotely) and pt states she already has a free 30 day trial card.  Pt verbalized understanding card will pay for today's discharge prescription and give insurance enough for authorization to cover the refills.  No other CM needs were communicated. Yves DillJeffries,  Christine, RN 09/27/2015, 12:51 PM

## 2015-09-27 NOTE — Discharge Summary (Signed)
Physician Discharge Summary  Lori Klein [id number] DOB: Jul 20, 1965 DOA: 09/26/2015  PCP: Christ KickErvin,Alison M, PA  Admit date: 09/26/2015 Discharge date: 09/27/2015  Recommendations for Outpatient Follow-up:  1. Follow up with PCP in 1 week.  2. Patient being discharged on Eliquis for acute RLE DVT and bilateral PE. She should be off oral contraceptives indefinitely.  3. Duration for treatment with anticoagulation is 3 months. Please check doppler RLE after completion of treatment to evaluate residual clots.  Home Health: NO Equipment/Devices: NONE  Discharge Condition:Stable CODE STATUS: Full code Diet recommendation: Regular    Discharge Diagnoses:  Principal Problem:   Acute deep vein thrombosis (DVT) of right lower extremity (HCC)  Active Problems:   Bilateral pulmonary embolism (HCC)  Brief narrative/history of present illness   50 year old female on oral contraceptives for several years presented with right lower leg swelling for the past 2 weeks. Swelling was mainly in her ankle and calf. On some days she had difficulty bending her right knee. She denies any trauma to the leg, pain, tingling or numbness, fever, chills, headache, blurred vision, chest pain, shortness of breath, diaphoresis, palpitations, abdominal pain, nausea or vomiting. Denies bowel or urinary symptoms. In the ED vitals were stable. Blood work done was unremarkable with Doppler of her right lower extremity showing acute DVT in the right femoral, popliteal, posterior tibial and peroneal veins. CT angiogram of the chest done showed bilateral moderate pulmonary emboli without right heart strain. Patient started on IV heparin and admitted to hospitalist service.  Discharge Diagnoses:  Principal Problem:   Acute deep vein thrombosis (DVT) of right lower extremity (HCC) Suspect secondary to oral contraceptives. Patient started on IV heparin. She is hemodynamically stable and does not have any leg pain,  chest pain or shortness of breath. I have started her on Eliquis with duration of treatment for at least 3 months. After completion of treatment she would need a Doppler of her right lower imaging to evaluate for any residual clot burden.  Active Problems:   Bilateral pulmonary embolism (HCC) Asymptomatic. CT chest rules out right heart strain. Does not need 2-D echo.   Patient stable to be discharged home. She is given clear instruction on monitoring any complicating symptoms of her underlying DVT and PE and to return to the ED immediately if she has any symptoms. She was also given options for anticoagulation and shows to be on newer oral anticoagulant. Patient explained both verbally and with written instructions on benefits and side effects of anticoagulation.  She should follow-up with her PCP in 1 week.   CODE STATUS: Full code  Family clinical indication: Friend at bedside  Disposition: Home   Discharge Instructions     Medication List    STOP taking these medications   cyclobenzaprine 10 MG tablet Commonly known as:  FLEXERIL   naproxen 500 MG tablet Commonly known as:  NAPROSYN     TAKE these medications   apixaban 5 MG Tabs tablet Commonly known as:  ELIQUIS Take 2 tablets (10 mg total) by mouth 2 (two) times daily.   apixaban 5 MG Tabs tablet Commonly known as:  ELIQUIS Take 1 tablet (5 mg total) by mouth 2 (two) times daily. Start taking on:  10/04/2015   Biotin 10 MG Caps Take 10 mg by mouth daily.   multivitamin with minerals Tabs tablet Take 1 tablet by mouth daily.   PIRMELLA 1/35 tablet Generic drug:  norethindrone-ethinyl estradiol 1/35 Take 1 tablet by mouth daily.  pyridOXINE 100 MG tablet Commonly known as:  VITAMIN B-6 Take 100 mg by mouth daily.   vitamin B-12 1000 MCG tablet Commonly known as:  CYANOCOBALAMIN Take 1,000 mcg by mouth daily.      Follow-up Information    [place], [place]. Schedule an appointment as soon as  possible for a visit in 1 week(s).   Contact information: [address] [place] [address] [phone]          No Known Allergies    Procedures/Studies: Dg Chest 2 View  Result Date: 09/26/2015 CLINICAL DATA:  Bilateral leg swelling off and on for 2 weeks. EXAM: CHEST  2 VIEW COMPARISON:  None. FINDINGS: Heart and mediastinal contours are within normal limits. No focal opacities or effusions. No acute bony abnormality. IMPRESSION: No active cardiopulmonary disease. Electronically Signed   By: Charlett Nose M.D.   On: 09/26/2015 16:57   Ct Angio Chest Pe W And/or Wo Contrast  Result Date: 09/26/2015 CLINICAL DATA:  Short of breath, RIGHT lower extremity swelling. EXAM: CT ANGIOGRAPHY CHEST WITH CONTRAST TECHNIQUE: Multidetector CT imaging of the chest was performed using the standard protocol during bolus administration of intravenous contrast. Multiplanar CT image reconstructions and MIPs were obtained to evaluate the vascular anatomy. CONTRAST:  100 mL Isovue COMPARISON:  None. FINDINGS: Cardiovascular: Tubular filling defects within the proximal and segmental branches of the RIGHT lower lobe pulmonary arteries consistent pulmonary emboli. These emboli are partially occlusive. Smaller tubular filling defects within the LEFT lower lobe pulmonary arteries which are also partially occlusive. Small lingular embolism noted. Small RIGHT upper lobe emboli additionally. The RIGHT ventricular ratio to LEFT ventricular ratio muscle is less than 1 inconsistent with RIGHT heart strain. Mediastinum/Nodes: No axillary or supraclavicular lymphadenopathy. No mediastinal adenopathy. Lungs/Pleura: no pulmonary infarction. Upper Abdomen: Limited view of the liver, kidneys, pancreas are unremarkable. Normal adrenal glands. Musculoskeletal: No aggressive osseous lesion. Review of the MIP images confirms the above findings. IMPRESSION: 1. Bilateral lower lobe acute pulmonary emboli. The overall  clot burden is moderate. Emboli are more occlusive in the RIGHT lower lobe than LEFT lower lobe. 2. No CT evidence of RIGHT ventricular strain. Critical Value/emergent results were called by telephone at the time of interpretation on 09/26/2015 at 9:22 pm to Dr. Charlestine Night , who verbally acknowledged these results. Electronically Signed   By: Genevive Bi M.D.   On: 09/26/2015 21:23       Subjective: Denies any leg pain, chest pain or shortness of breath.  Discharge Exam: Vitals:   09/27/15 0240 09/27/15 0542  BP: 120/74 115/79  Pulse: 69 86  Resp: 18 18  Temp: 98.5 F (36.9 C) 98.5 F (36.9 C)   Vitals:   09/27/15 0130 09/27/15 0200 09/27/15 0240 09/27/15 0542  BP: 117/78 102/88 120/74 115/79  Pulse: 73 68 69 86  Resp: 18 18 18 18   Temp:   98.5 F (36.9 C) 98.5 F (36.9 C)  TempSrc:   Oral Oral  SpO2: 99% 98% 98% 98%  Weight:   70.7 kg (155 lb 13.8 oz)   Height:   5\' 7"  (1.702 m)     General: Middle aged female not in distress HEENT: No pallor, moist mucosa, supple neck Cardiovascular: RRR, S1/S2 +, no rubs, no gallops Respiratory: CTA bilaterally, no wheezing, no rhonchi Abdominal: Soft, NT, ND, bowel sounds + Extremities: Warm, swelling of  lower right lower, nontender     The results of significant diagnostics from this hospitalization (including imaging,  microbiology, ancillary and laboratory) are listed below for reference.     Microbiology: No results found for this or any previous visit (from the past 240 hour(s)).   Labs: BNP (last 3 results) No results for input(s): BNP in the last 8760 hours. Basic Metabolic Panel:  Recent Labs Lab 09/26/15 1708 09/27/15 0350  NA 139 142  K 3.9 3.5  CL 108 110  CO2 27 26  GLUCOSE 105* 101*  BUN 14 12  CREATININE 0.97 0.85  CALCIUM 9.1 8.7*  MG  --  2.0  PHOS  --  3.6   Liver Function Tests:  Recent Labs Lab 09/26/15 1708 09/27/15 0350  AST 23 21  ALT 19 18  ALKPHOS 38 37*  BILITOT 0.4  0.6  PROT 7.2 6.7  ALBUMIN 3.6 3.3*   No results for input(s): LIPASE, AMYLASE in the last 168 hours. No results for input(s): AMMONIA in the last 168 hours. CBC:  Recent Labs Lab 09/26/15 1708 09/27/15 0350  WBC 10.3 9.0  NEUTROABS 7.1  --   HGB 13.1 12.9  HCT 37.3 37.1  MCV 82.9 83.2  PLT 366 373   Cardiac Enzymes:  Recent Labs Lab 09/26/15 2228  TROPONINI <0.03   BNP: Invalid input(s): POCBNP CBG: No results for input(s): GLUCAP in the last 168 hours. D-Dimer No results for input(s): DDIMER in the last 72 hours. Hgb A1c No results for input(s): HGBA1C in the last 72 hours. Lipid Profile No results for input(s): CHOL, HDL, LDLCALC, TRIG, CHOLHDL, LDLDIRECT in the last 72 hours. Thyroid function studies  Recent Labs  09/27/15 0350  TSH 3.679   Anemia work up No results for input(s): VITAMINB12, FOLATE, FERRITIN, TIBC, IRON, RETICCTPCT in the last 72 hours. Urinalysis    Component Value Date/Time   COLORURINE YELLOW 09/26/2015 1843   APPEARANCEUR CLEAR 09/26/2015 1843   LABSPEC 1.010 09/26/2015 1843   PHURINE 6.0 09/26/2015 1843   GLUCOSEU NEGATIVE 09/26/2015 1843   HGBUR SMALL (A) 09/26/2015 1843   BILIRUBINUR NEGATIVE 09/26/2015 1843   KETONESUR NEGATIVE 09/26/2015 1843   PROTEINUR NEGATIVE 09/26/2015 1843   NITRITE NEGATIVE 09/26/2015 1843   LEUKOCYTESUR NEGATIVE 09/26/2015 1843   Sepsis Labs Invalid input(s): PROCALCITONIN,  WBC,  LACTICIDVEN Microbiology No results found for this or any previous visit (from the past 240 hour(s)).   Time coordinating discharge: < 30 minutes  SIGNED:   Eddie North, MD  [facility] 09/27/2015, 1:09 PM Pager   If 7PM-7AM, please contact night-coverage [email or web address] Password [email or web address]

## 2015-09-27 NOTE — Progress Notes (Signed)
ANTICOAGULATION CONSULT NOTE - Consult  Pharmacy Consult for IV heparin Indication: PE/DVT  No Known Allergies  Patient Measurements: Height: 5\' 7"  (170.2 cm) Weight: 155 lb 13.8 oz (70.7 kg) IBW/kg (Calculated) : 61.6 Heparin Dosing Weight: TBW  Vital Signs: Temp: 98.5 F (36.9 C) (08/26 0240) Temp Source: Oral (08/26 0240) BP: 120/74 (08/26 0240) Pulse Rate: 69 (08/26 0240)  Labs:  Recent Labs  09/26/15 1708 09/26/15 2204 09/26/15 2228 09/27/15 0350  HGB 13.1  --   --  12.9  HCT 37.3  --   --  37.1  PLT 366  --   --  373  APTT  --  30  --   --   LABPROT  --  14.9  --   --   INR  --  1.16  --   --   HEPARINUNFRC  --   --   --  0.53  CREATININE 0.97  --   --  0.85  TROPONINI  --   --  <0.03  --     Estimated Creatinine Clearance: 78.7 mL/min (by C-G formula based on SCr of 0.85 mg/dL).   Medical History: History reviewed. No pertinent past medical history.  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

## 2015-09-27 NOTE — Discharge Instructions (Addendum)
Apixaban oral tablets °What is this medicine? °APIXABAN (a PIX a ban) is an anticoagulant (blood thinner). It is used to lower the chance of stroke in people with a medical condition called atrial fibrillation. It is also used to treat or prevent blood clots in the lungs or in the veins. °This medicine may be used for other purposes; ask your health care provider or pharmacist if you have questions. °What should I tell my health care provider before I take this medicine? °They need to know if you have any of these conditions: °-bleeding disorders °-bleeding in the brain °-blood in your stools (black or tarry stools) or if you have blood in your vomit °-history of stomach bleeding °-kidney disease °-liver disease °-mechanical heart valve °-an unusual or allergic reaction to apixaban, other medicines, foods, dyes, or preservatives °-pregnant or trying to get pregnant °-breast-feeding °How should I use this medicine? °Take this medicine by mouth with a glass of water. Follow the directions on the prescription label. You can take it with or without food. If it upsets your stomach, take it with food. Take your medicine at regular intervals. Do not take it more often than directed. Do not stop taking except on your doctor's advice. Stopping this medicine may increase your risk of a blot clot. Be sure to refill your prescription before you run out of medicine. °Talk to your pediatrician regarding the use of this medicine in children. Special care may be needed. °Overdosage: If you think you have taken too much of this medicine contact a poison control center or emergency room at once. °NOTE: This medicine is only for you. Do not share this medicine with others. °What if I miss a dose? °If you miss a dose, take it as soon as you can. If it is almost time for your next dose, take only that dose. Do not take double or extra doses. °What may interact with this medicine? °This medicine may interact with the following: °-aspirin  and aspirin-like medicines °-certain medicines for fungal infections like ketoconazole and itraconazole °-certain medicines for seizures like carbamazepine and phenytoin °-certain medicines that treat or prevent blood clots like warfarin, enoxaparin, and dalteparin °-clarithromycin °-NSAIDs, medicines for pain and inflammation, like ibuprofen or naproxen °-rifampin °-ritonavir °-St. John's wort °This list may not describe all possible interactions. Give your health care provider a list of all the medicines, herbs, non-prescription drugs, or dietary supplements you use. Also tell them if you smoke, drink alcohol, or use illegal drugs. Some items may interact with your medicine. °What should I watch for while using this medicine? °Notify your doctor or health care professional and seek emergency treatment if you develop breathing problems; changes in vision; chest pain; severe, sudden headache; pain, swelling, warmth in the leg; trouble speaking; sudden numbness or weakness of the face, arm, or leg. These can be signs that your condition has gotten worse. °If you are going to have surgery, tell your doctor or health care professional that you are taking this medicine. °Tell your health care professional that you use this medicine before you have a spinal or epidural procedure. Sometimes people who take this medicine have bleeding problems around the spine when they have a spinal or epidural procedure. This bleeding is very rare. If you have a spinal or epidural procedure while on this medicine, call your health care professional immediately if you have back pain, numbness or tingling (especially in your legs and feet), muscle weakness, paralysis, or loss of bladder or bowel   control. Avoid sports and activities that might cause injury while you are using this medicine. Severe falls or injuries can cause unseen bleeding. Be careful when using sharp tools or knives. Consider using an Neurosurgeon. Take special care  brushing or flossing your teeth. Report any injuries, bruising, or red spots on the skin to your doctor or health care professional. What side effects may I notice from receiving this medicine? Side effects that you should report to your doctor or health care professional as soon as possible: -allergic reactions like skin rash, itching or hives, swelling of the face, lips, or tongue -signs and symptoms of bleeding such as bloody or black, tarry stools; red or dark-brown urine; spitting up blood or brown material that looks like coffee grounds; red spots on the skin; unusual bruising or bleeding from the eye, gums, or nose This list may not describe all possible side effects. Call your doctor for medical advice about side effects. You may report side effects to FDA at 1-800-FDA-1088. Where should I keep my medicine? Keep out of the reach of children. Store at room temperature between 20 and 25 degrees C (68 and 77 degrees F). Throw away any unused medicine after the expiration date. NOTE: This sheet is a summary. It may not cover all possible information. If you have questions about this medicine, talk to your doctor, pharmacist, or health care provider.    2016, Elsevier/[employer]. (2012-09-22 11:59:24)     Pulmonary Embolism A pulmonary embolism (PE) is a sudden blockage or decrease of blood flow in one lung or both lungs. Most blockages come from a blood clot that travels from the legs or the pelvis to the lungs. PE is a dangerous and potentially life-threatening condition if it is not treated right away. CAUSES A pulmonary embolism occurs most commonly when a blood clot travels from one of your veins to your lungs. Rarely, PE is caused by air, fat, amniotic fluid, or part of a tumor traveling through your veins to your lungs. RISK FACTORS A PE is more likely to develop in:  People who smoke.  People who areolder, especially over 44 years of age.  People who are overweight  (obese).  People who sit or lie still for a long time, such as during long-distance travel (over 4 hours), bed rest, hospitalization, or during recovery from certain medical conditions like a stroke.  People who do not engage in much physical activity (sedentary lifestyle).  People who have chronic breathing disorders.  People whohave a personal or family history of blood clots or blood clotting disease.  People whohave peripheral vascular disease (PVD), diabetes, or some types of cancer.  People who haveheart disease, especially if the person had a recent heart attack or has congestive heart failure.  People who have neurological diseases that affect the legs (leg paresis).  People who have had a traumatic injury, such as breaking a hip or leg.  People whohave recently had major or lengthy surgery, especially on the hip, knee, or abdomen.  People who have hada central line placed inside a large vein.  People who takemedicines that contain the hormone estrogen. These include birth control pills and hormone replacement therapy.  Pregnancy or during childbirth or the postpartum period. SIGNS AND SYMPTOMS  The symptoms of a PE usually start suddenly and include:  Shortness of breath while active or at rest.  Coughing or coughing up blood or blood-tinged mucus.  Chest pain that is often worse with deep breaths.  Rapid or irregular heartbeat.  Feeling light-headed or dizzy.  Fainting.  Feelinganxious.  Sweating. There may also be pain and swelling in a leg if that is where the blood clot started. These symptoms may represent a serious problem that is an emergency. Do not wait to see if the symptoms will go away. Get medical help right away. Call your local emergency services (911 in the [place] Do not drive yourself to the hospital. DIAGNOSIS Your health care provider will take a medical history and perform a physical exam. You may also have other tests,  including:  Blood tests to assess the clotting properties of your blood, assess oxygen levels in your blood, and find blood clots.  Imaging tests, such as CT, ultrasound, MRI, X-ray, and other tests to see if you have clots anywhere in your body.  An electrocardiogram (ECG) to look for heart strain from blood clots in the lungs. TREATMENT The main goals of PE treatment are:  To stop a blood clot from growing larger.  To stop new blood clots from forming. The type of treatment that you receive depends on many factors, such as the cause of your PE, your risk for bleeding or developing more clots, and other medical conditions that you have. Sometimes, a combination of treatments is necessary. This condition may be treated with:  Medicines, including newer oral blood thinners (anticoagulants), warfarin, low molecular weight heparins, thrombolytics, or heparins.  Wearing compression stockings or using different types of devices.  Surgery (rare) to remove the blood clot or to place a filter in your abdomen to stop the blood clot from traveling to your lungs. Treatments for a PE are often divided into immediate treatment, long-term treatment (up to 3 months after PE), and extended treatment (more than 3 months after PE). Your treatment may continue for several months. This is called maintenance therapy, and it is used to prevent the forming of new blood clots. You can work with your health care provider to choose the treatment program that is best for you. What are anticoagulants? Anticoagulants are medicines that treat PEs. They can stop current blood clots from growing and stop new clots from forming. They cannot dissolve existing clots. Your body dissolves clots by itself over time. Anticoagulants are given by mouth, by injection, or through an IV tube. What are thrombolytics? Thrombolytics are clot-dissolving medicines that are used to dissolve a PE. They carry a high risk of bleeding, so  they tend to be used only in severe cases or if you have very low blood pressure. HOME CARE INSTRUCTIONS If you are taking a newer oral anticoagulant:  Take the medicine every single day at the same time each day.  Understand what foods and drugs interact with this medicine.  Understand that there are no regular blood tests required when using this medicine.  Understandthe side effects of this medicine, including excessive bruising or bleeding. Ask your health care provider or pharmacist about other possible side effects. If you are taking warfarin:  Understand how to take warfarin and know which foods can affect how warfarin works in [employer] relations account executive.  Understand that it is dangerous to taketoo much or too little warfarin. Too much warfarin increases the risk of bleeding. Too little warfarin continues to allow the risk for blood clots.  Follow your PT and INR blood testing schedule. The PT and INR results allow your health care provider to adjust your dose of warfarin. It is very important that you have your PT and INR  tested as often as told by your health care provider.  Avoid major changes in your diet, or tell your health care provider before you change your diet. Arrange a visit with a registered dietitian to answer your questions. Many foods, especially foods that are high in vitamin K, can interfere with warfarin and affect the PT and INR results. Eat a consistent amount of foods that are high in vitamin K, such as:  Spinach, kale, broccoli, cabbage, collard greens, turnip greens, Brussels sprouts, peas, cauliflower, seaweed, and parsley.  Beef liver and pork liver.  Green tea.  Soybean oil.  Tell your health care provider about any and all medicines, vitamins, and supplements that you take, including aspirin and other over-the-counter anti-inflammatory medicines. Be especially cautious with aspirin and anti-inflammatory medicines. Do not take those before you ask your health care  provider if it is safe to do so. This is important because many medicines can interfere with warfarin and affect the PT and INR results.  Do not start or stop taking any over-the-counter or prescription medicine unless your health care provider or pharmacist tells you to do so. If you take warfarin, you will also need to do these things:  Hold pressure over cuts for longer than usual.  Tell your dentist and other health care providers that you are taking warfarin before you have any procedures in which bleeding may occur.  Avoid alcohol or drink very small amounts. Tell your health care provider if you change your alcohol intake.  Do not use tobacco products, including cigarettes, chewing tobacco, and e-cigarettes. If you need help quitting, ask your health care provider.  Avoid contact sports. General Instructions  Take over-the-counter and prescription medicines only as told by your health care provider. Anticoagulant medicines can have side effects, including easy bruising and difficulty stopping bleeding. If you are prescribed an anticoagulant, you will also need to do these things:  Hold pressure over cuts for longer than usual.  Tell your dentist and other health care providers that you are taking anticoagulants before you have any procedures in which bleeding may occur.  Avoid contact sports.  Wear a medical alert bracelet or carry a medical alert card that says you have had a PE.  Ask your health care provider how soon you can go back to your normal activities. Stay active to prevent new blood clots from forming.  Make sure to exercise while traveling or when you have been sitting or standing for a long period of time. It is very important to exercise. Exercise your legs by walking or by tightening and relaxing your leg muscles often. Take frequent walks.  Wear compression stockings as told by your health care provider to help prevent more blood clots from forming.  Do not  use tobacco products, including cigarettes, chewing tobacco, and e-cigarettes. If you need help quitting, ask your health care provider.  Keep all follow-up appointments with your health care provider. This is important. PREVENTION Take these actions to decrease your risk of developing another PE:  Exercise regularly. For at least 30 minutes every day, engage in:  Activity that involves moving your arms and legs.  Activity that encourages good blood flow through your body by increasing your heart rate.  Exercise your arms and legs every hour during long-distance travel (over 4 hours). Drink plenty of water and avoid drinking alcohol while traveling.  Avoid sitting or lying in bed for long periods of time without moving your legs.  Maintain a weight that  is appropriate for your height. Ask your health care provider what weight is healthy for you.  If you are a woman who is over 24 years of age, avoid unnecessary use of medicines that contain estrogen. These include birth control pills.  Do not smoke, especially if you take estrogen medicines. If you need help quitting, ask your health care provider.  If you are at very high risk for PE, wear compression stockings.  If you recently had a PE, have regularly scheduled ultrasound testing on your legs to check for new blood clots. If you are hospitalized, prevention measures may include:  Early walking after surgery, as soon as your health care provider says that it is safe.  Receiving anticoagulants to prevent blood clots. If you cannot take anticoagulants, other options may be available, such as wearing compression stockings or using different types of devices. SEEK IMMEDIATE MEDICAL CARE IF:  You have new or increased pain, swelling, or redness in an arm or leg.  You have numbness or tingling in an arm or leg.  You have shortness of breath while active or at rest.  You have chest pain.  You have a rapid or irregular  heartbeat.  You feel light-headed or dizzy.  You cough up blood.  You notice blood in your vomit, bowel movement, or urine.  You have a fever. These symptoms may represent a serious problem that is an emergency. Do not wait to see if the symptoms will go away. Get medical help right away. Call your local emergency services (911 in the [place] Do not drive yourself to the hospital.   This information is not intended to replace advice given to you by your health care provider. Make sure you discuss any questions you have with your health care provider.   Document Released: 01/16/2000 Document Revised: 10/09/2014 Document Reviewed: 05/15/2014 Elsevier Interactive Patient Education 2016 [employer]    Deep Vein Thrombosis A deep vein thrombosis (DVT) is a blood clot (thrombus) that usually occurs in a deep, larger vein of the lower leg or the pelvis, or in an upper extremity such as the arm. These are dangerous and can lead to serious and even life-threatening complications if the clot travels to the lungs. A DVT can damage the valves in your leg veins so that instead of flowing upward, the blood pools in the lower leg. This is called post-thrombotic syndrome, and it can result in pain, swelling, discoloration, and sores on the leg. CAUSES A DVT is caused by the formation of a blood clot in your leg, pelvis, or arm. Usually, several things contribute to the formation of blood clots. A clot may develop when:  Your blood flow slows down.  Your vein becomes damaged in some way.  You have a condition that makes your blood clot more easily. RISK FACTORS A DVT is more likely to develop in:  People who are older, especially over 20 years of age.  People who are overweight (obese).  People who sit or lie still for a long time, such as during long-distance travel (over 4 hours), bed rest, hospitalization, or during recovery from certain medical conditions like a stroke.  People who do not  engage in much physical activity (sedentary lifestyle).  People who have chronic breathing disorders.  People who have a personal or family history of blood clots or blood clotting disease.  People who have peripheral vascular disease (PVD), diabetes, or some types of cancer.  People who have heart disease, especially if  the person had a recent heart attack or has congestive heart failure.  People who have neurological diseases that affect the legs (leg paresis).  People who have had a traumatic injury, such as breaking a hip or leg.  People who have recently had major or lengthy surgery, especially on the hip, knee, or abdomen.  People who have had a central line placed inside a large vein.  People who take medicines that contain the hormone estrogen. These include birth control pills and hormone replacement therapy.  Pregnancy or during childbirth or the postpartum period.  Long plane flights (over 8 hours). SIGNS AND SYMPTOMS Symptoms of a DVT can include:   Swelling of your leg or arm, especially if one side is much worse.  Warmth and redness of your leg or arm, especially if one side is much worse.  Pain in your arm or leg. If the clot is in your leg, symptoms may be more noticeable or worse when you stand or walk.  A feeling of pins and needles, if the clot is in the arm. The symptoms of a DVT that has traveled to the lungs (pulmonary embolism, PE) usually start suddenly and include:  Shortness of breath while active or at rest.  Coughing or coughing up blood or blood-tinged mucus.  Chest pain that is often worse with deep breaths.  Rapid or irregular heartbeat.  Feeling light-headed or dizzy.  Fainting.  Feeling anxious.  Sweating. There may also be pain and swelling in a leg if that is where the blood clot started. These symptoms may represent a serious problem that is an emergency. Do not wait to see if the symptoms will go away. Get medical help right  away. Call your local emergency services (911 in the [place] Do not drive yourself to the hospital. DIAGNOSIS Your health care provider will take a medical history and perform a physical exam. You may also have other tests, including:  Blood tests to assess the clotting properties of your blood.  Imaging tests, such as CT, ultrasound, MRI, X-ray, and other tests to see if you have clots anywhere in your body. TREATMENT After a DVT is identified, it can be treated. The type of treatment that you receive depends on many factors, such as the cause of your DVT, your risk for bleeding or developing more clots, and other medical conditions that you have. Sometimes, a combination of treatments is necessary. Treatment options may be combined and include:  Monitoring the blood clot with ultrasound.  Taking medicines by mouth, such as newer blood thinners (anticoagulants), thrombolytics, or warfarin.  Taking anticoagulant medicine by injection or through an IV tube.  Wearing compression stockings or using different types ofdevices.  Surgery (rare) to remove the blood clot or to place a filter in your abdomen to stop the blood clot from traveling to your lungs. Treatments for a DVT are often divided into immediate treatment and long-term treatment (up to 3 months after DVT). You can work with your health care provider to choose the treatment program that is best for you. HOME CARE INSTRUCTIONS If you are taking a newer oral anticoagulant:  Take the medicine every single day at the same time each day.  Understand what foods and drugs interact with this medicine.  Understand that there are no regular blood tests required when using this medicine.  Understand the side effects of this medicine, including excessive bruising or bleeding. Ask your health care provider or pharmacist about other possible side effects. If  you are taking warfarin:  Understand how to take warfarin and know which foods can  affect how warfarin works in [employer] account [employer].  Understand that it is dangerous to take too much or too little warfarin. Too much warfarin increases the risk of bleeding. Too little warfarin continues to allow the risk for blood clots.  Follow your PT and INR blood testing schedule. The PT and INR results allow your health care provider to adjust your dose of warfarin. It is very important that you have your PT and INR tested as often as told by your health care provider.  Avoid major changes in your diet, or tell your health care provider before you change your diet. Arrange a visit with a registered dietitian to answer your questions. Many foods, especially foods that are high in vitamin K, can interfere with warfarin and affect the PT and INR results. Eat a consistent amount of foods that are high in vitamin K, such as:  Spinach, kale, broccoli, cabbage, collard greens, turnip greens, Brussels sprouts, peas, cauliflower, seaweed, and parsley.  Beef liver and pork liver.  Green tea.  Soybean oil.  Tell your health care provider about any and all medicines, vitamins, and supplements that you take, including aspirin and other over-the-counter anti-inflammatory medicines. Be especially cautious with aspirin and anti-inflammatory medicines. Do not take those before you ask your health care provider if it is safe to do so. This is important because many medicines can interfere with warfarin and affect the PT and INR results.  Do not start or stop taking any over-the-counter or prescription medicine unless your health care provider or pharmacist tells you to do so. If you take warfarin, you will also need to do these things:  Hold pressure over cuts for longer than usual.  Tell your dentist and other health care providers that you are taking warfarin before you have any procedures in which bleeding may occur.  Avoid alcohol or drink very small amounts. Tell your health care provider if you change your  alcohol intake.  Do not use tobacco products, including cigarettes, chewing tobacco, and e-cigarettes. If you need help quitting, ask your health care provider.  Avoid contact sports. General Instructions  Take over-the-counter and prescription medicines only as told by your health care provider. Anticoagulant medicines can have side effects, including easy bruising and difficulty stopping bleeding. If you are prescribed an anticoagulant, you will also need to do these things:  Hold pressure over cuts for longer than usual.  Tell your dentist and other health care providers that you are taking anticoagulants before you have any procedures in which bleeding may occur.  Avoid contact sports.  Wear a medical alert bracelet or carry a medical alert card that says you have had a PE.  Ask your health care provider how soon you can go back to your normal activities. Stay active to prevent new blood clots from forming.  Make sure to exercise while traveling or when you have been sitting or standing for a long period of time. It is very important to exercise. Exercise your legs by walking or by tightening and relaxing your leg muscles often. Take frequent walks.  Wear compression stockings as told by your health care provider to help prevent more blood clots from forming.  Do not use tobacco products, including cigarettes, chewing tobacco, and e-cigarettes. If you need help quitting, ask your health care provider.  Keep all follow-up appointments with your health care provider. This is important. PREVENTION Take  these actions to decrease your risk of developing another DVT:  Exercise regularly. For at least 30 minutes every day, engage in:  Activity that involves moving your arms and legs.  Activity that encourages good blood flow through your body by increasing your heart rate.  Exercise your arms and legs every hour during long-distance travel (over 4 hours). Drink plenty of water and  avoid drinking alcohol while traveling.  Avoid sitting or lying in bed for long periods of time without moving your legs.  Maintain a weight that is appropriate for your height. Ask your health care provider what weight is healthy for you.  If you are a woman who is over 28 years of age, avoid unnecessary use of medicines that contain estrogen. These include birth control pills.  Do not smoke, especially if you take estrogen medicines. If you need help quitting, ask your health care provider. If you are hospitalized, prevention measures may include:  Early walking after surgery, as soon as your health care provider says that it is safe.  Receiving anticoagulants to prevent blood clots.If you cannot take anticoagulants, other options may be available, such as wearing compression stockings or using different types of devices. SEEK IMMEDIATE MEDICAL CARE IF:  You have new or increased pain, swelling, or redness in an arm or leg.  You have numbness or tingling in an arm or leg.  You have shortness of breath while active or at rest.  You have chest pain.  You have a rapid or irregular heartbeat.  You feel light-headed or dizzy.  You cough up blood.  You notice blood in your vomit, bowel movement, or urine. These symptoms may represent a serious problem that is an emergency. Do not wait to see if the symptoms will go away. Get medical help right away. Call your local emergency services (911 in the [place] Do not drive yourself to the hospital.   This information is not intended to replace advice given to you by your health care provider. Make sure you discuss any questions you have with your health care provider.   Document Released: 01/18/2005 Document Revised: 10/09/2014 Document Reviewed: 05/15/2014 Elsevier Interactive Patient Education 2016 [employer].  Information on my medicine - ELIQUIS (apixaban)  This medication education was reviewed with me or my healthcare  representative as part of my discharge preparation.  The pharmacist that spoke with me during my hospital stay was:  Berkley Harvey, [facility]  Why was Eliquis prescribed for you? Eliquis was prescribed to treat blood clots that may have been found in the veins of your legs (deep vein thrombosis) or in your lungs (pulmonary embolism) and to reduce the risk of them occurring again.  What do You need to know about Eliquis ? The starting dose is 10 mg (two 5 mg tablets) taken TWICE daily for the FIRST SEVEN (7) DAYS, then on (enter date)  10/04/15  the dose is reduced to ONE 5 mg tablet taken TWICE daily.  Eliquis may be taken with or without food.   Try to take the dose about the same time in the morning and in the evening. If you have difficulty swallowing the tablet whole please discuss with your pharmacist how to take the medication safely.  Take Eliquis exactly as prescribed and DO NOT stop taking Eliquis without talking to the doctor who prescribed the medication.  Stopping may increase your risk of developing a new blood clot.  Refill your prescription before you run out.  After discharge,  you should have regular check-up appointments with your healthcare provider that is prescribing your Eliquis.    What do you do if you miss a dose? If a dose of ELIQUIS is not taken at the scheduled time, take it as soon as possible on the same day and twice-daily administration should be resumed. The dose should not be doubled to make up for a missed dose.  Important Safety Information A possible side effect of Eliquis is bleeding. You should call your healthcare provider right away if you experience any of the following: ? Bleeding from an injury or your nose that does not stop. ? Unusual colored urine (red or dark brown) or unusual colored stools (red or black). ? Unusual bruising for unknown reasons. ? A serious fall or if you hit your head (even if there is no bleeding).  Some medicines  may interact with Eliquis and might increase your risk of bleeding or clotting while on Eliquis. To help avoid this, consult your healthcare provider or pharmacist prior to using any new prescription or non-prescription medications, including herbals, vitamins, non-steroidal anti-inflammatory drugs (NSAIDs) and supplements.  This website has more information on Eliquis (apixaban): http://www.eliquis.com/eliquis/home

## 2015-09-27 NOTE — Progress Notes (Signed)
ANTICOAGULATION CONSULT NOTE - Consult  Pharmacy Consult for IV heparin - transition to apixaban on 8/26 Indication: PE/DVT  No Known Allergies  Patient Measurements: Height: 5\' 7"  (170.2 cm) Weight: 155 lb 13.8 oz (70.7 kg) IBW/kg (Calculated) : 61.6 Heparin Dosing Weight: TBW  Vital Signs: Temp: 98.5 F (36.9 C) (08/26 0542) Temp Source: Oral (08/26 0542) BP: 115/79 (08/26 0542) Pulse Rate: 86 (08/26 0542)  Labs:  Recent Labs  09/26/15 1708 09/26/15 2204 09/26/15 2228 09/27/15 0350  HGB 13.1  --   --  12.9  HCT 37.3  --   --  37.1  PLT 366  --   --  373  APTT  --  30  --   --   LABPROT  --  14.9  --   --   INR  --  1.16  --   --   HEPARINUNFRC  --   --   --  0.53  CREATININE 0.97  --   --  0.85  TROPONINI  --   --  <0.03  --     Estimated Creatinine Clearance: 78.7 mL/min (by C-G formula based on SCr of 0.85 mg/dL).   Medical History: History reviewed. No pertinent past medical history.  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

## 2015-10-20 ENCOUNTER — Emergency Department (HOSPITAL_BASED_OUTPATIENT_CLINIC_OR_DEPARTMENT_OTHER)
Admit: 2015-10-20 | Discharge: 2015-10-20 | Disposition: A | Discharge status: Home / Self Care | Payer: Federal, State, Local not specified - PPO

## 2015-10-20 ENCOUNTER — Inpatient Hospital Stay (HOSPITAL_COMMUNITY): Admit: 2015-10-20 | Payer: Federal, State, Local not specified - PPO

## 2015-10-20 ENCOUNTER — Emergency Department (HOSPITAL_COMMUNITY)
Admission: EM | Admit: 2015-10-20 | Discharge: 2015-10-20 | Disposition: A | Discharge status: Home / Self Care | Payer: Federal, State, Local not specified - PPO | Attending: Emergency Medicine | Admitting: Emergency Medicine

## 2015-10-20 ENCOUNTER — Encounter (HOSPITAL_COMMUNITY): Payer: Self-pay | Admitting: *Deleted

## 2015-10-20 DIAGNOSIS — E876 Hypokalemia: Secondary | ICD-10-CM

## 2015-10-20 DIAGNOSIS — M7989 Other specified soft tissue disorders: Secondary | ICD-10-CM | POA: Diagnosis not present

## 2015-10-20 DIAGNOSIS — R600 Localized edema: Secondary | ICD-10-CM

## 2015-10-20 DIAGNOSIS — M79609 Pain in unspecified limb: Secondary | ICD-10-CM | POA: Diagnosis not present

## 2015-10-20 DIAGNOSIS — Z79899 Other long term (current) drug therapy: Secondary | ICD-10-CM | POA: Insufficient documentation

## 2015-10-20 DIAGNOSIS — I82401 Acute embolism and thrombosis of unspecified deep veins of right lower extremity: Secondary | ICD-10-CM | POA: Diagnosis not present

## 2015-10-20 HISTORY — DX: Acute embolism and thrombosis of unspecified deep veins of unspecified lower extremity: I82.409

## 2015-10-20 HISTORY — DX: Other pulmonary embolism without acute cor pulmonale: I26.99

## 2015-10-20 LAB — CBC
HEMATOCRIT: 43.2 % (ref 36.0–46.0)
Hemoglobin: 14.8 g/dL (ref 12.0–15.0)
MCH: 29.1 pg (ref 26.0–34.0)
MCHC: 34.3 g/dL (ref 30.0–36.0)
MCV: 85 fL (ref 78.0–100.0)
PLATELETS: 331 10*3/uL (ref 150–400)
RBC: 5.08 MIL/uL (ref 3.87–5.11)
RDW: 12.7 % (ref 11.5–15.5)
WBC: 11.8 10*3/uL — ABNORMAL HIGH (ref 4.0–10.5)

## 2015-10-20 LAB — BASIC METABOLIC PANEL
ANION GAP: 9 (ref 5–15)
BUN: 12 mg/dL (ref 6–20)
CALCIUM: 9.7 mg/dL (ref 8.9–10.3)
CO2: 27 mmol/L (ref 22–32)
Chloride: 105 mmol/L (ref 101–111)
Creatinine, Ser: 0.94 mg/dL (ref 0.44–1.00)
GFR calc Af Amer: >60 mL/min (ref >60)
GFR calc non Af Amer: >60 mL/min (ref >60)
GLUCOSE: 81 mg/dL (ref 65–99)
Potassium: 3.2 mmol/L — ABNORMAL LOW (ref 3.5–5.1)
Sodium: 141 mmol/L (ref 135–145)

## 2015-10-20 LAB — CK: Total CK: 90 U/L (ref 38–234)

## 2015-10-20 MED ORDER — POTASSIUM CHLORIDE CRYS ER 20 MEQ PO TBCR
40.0000 meq | EXTENDED_RELEASE_TABLET | Freq: Once | ORAL | Status: AC
  Administered 2015-10-20: 40 meq via ORAL
  Filled 2015-10-20: qty 2

## 2015-10-20 NOTE — ED Triage Notes (Addendum)
Patient was dx with a DVT and PE on 8/25 and has been taking Eliquis ever since.  Patient had a DVT in the right leg and bilateral PE.  On Saturday past, patient noticed renewed swelling in her right posterior thigh.  She denies SOB, but states she is anxious about the swelling in her leg.  Patient saw her PCP, Dr. Alysia PennaErvin this morning, and was directed here to a Venous Doppler study.

## 2015-10-20 NOTE — ED Notes (Signed)
RN placing ultasound IV.

## 2015-10-20 NOTE — ED Notes (Signed)
RN to draw labs and get iv. Patient request ultrasound stick.

## 2015-10-20 NOTE — ED Notes (Signed)
Ultrasound VE at bedside

## 2015-10-20 NOTE — ED Notes (Signed)
Urine obtained at bedside

## 2015-10-20 NOTE — Progress Notes (Addendum)
VASCULAR LAB PRELIMINARY  PRELIMINARY  PRELIMINARY  PRELIMINARY  Bilateral lower extremity venous duplex completed.    Preliminary report:  Positive for DVT from the mid to proximal posterior tibial vein, proximal peroneal, popliteal, and distal to mid femoral. No evidence of superficial thrombosis or Baker's cyst. Essentially unchanged from previous study of 8/17. Left - No evidence of DVT or superficial thrombosis. Positive for a Baker's cyst in the popliteal fossa. No changes from previous study.  , , RVS 10/20/2015, 5:46 PM

## 2015-10-20 NOTE — ED Notes (Signed)
Bed: WA08 Expected date:  Expected time:  Means of arrival:  [facility]

## 2015-10-20 NOTE — ED Provider Notes (Signed)
[facility]-EMERGENCY DEPT Provider Note   CSN: [id number] Arrival date & time: 10/20/15  1256     History   Chief Complaint Chief Complaint  Patient presents with  . Leg Swelling    HPI Lori Klein is a 50 y.o. female.  HPI 50 yo AAF with PMHx of provoked DVT/PE, recently diagnosed on 8/26, here with RLE swelling. Pt seen by PCP today nad sent for U/S. As mentioned, she was just hospitalized for DVT/PE thought 2/2 OCP use. She was placed on Eliquis and taken off OCPs. Has been recovering well. However, over past 2-3 days she has noticed increased swelling of her right leg. Denies any missed doses of eliquis, Swelling worsens throughout day and improves with elevation. She denies any significant pain but does have intermittent aching, cramping throughout leg. No numbness or tingling. No trauma. Presented to PCP who sent here for evaluation.  Past Medical History:  Diagnosis Date  . DVT (deep venous thrombosis) (HCC)   . Pulmonary embolism [facility](HCC)     Patient Active Problem List   Diagnosis Date Noted  . Bilateral pulmonary embolism (HCC) 09/27/2015  . Acute deep vein thrombosis (DVT) of right lower extremity (HCC) 09/27/2015    Past Surgical History:  Procedure Laterality Date  . CHOLECYSTECTOMY    . ulnar surgery      OB History    No data available       Home Medications    Prior to Admission medications   Medication Sig Start Date End Date Taking? Authorizing Provider  apixaban (ELIQUIS) 5 MG TABS tablet Take 1 tablet (5 mg total) by mouth 2 (two) times daily. 10/04/15 12/27/15 Yes Nishant Dhungel, MD  Multiple Vitamin (MULTIVITAMIN WITH MINERALS) TABS tablet Take 1 tablet by mouth daily.   Yes Historical Provider, MD  apixaban (ELIQUIS) 5 MG TABS tablet Take 2 tablets (10 mg total) by mouth 2 (two) times daily. Patient not taking: Reported on 10/20/2015 09/27/15 10/04/15  Theda BelfastNishant Dhungel, MD  Norethindrone-Eth Estradiol (PIRMELLA 1/35 PO) Take 1 tablet by mouth daily.     Historical Provider, MD    Family History Family History  Problem Relation Age of Onset  . Hypertension Mother   . Hypertension Father   . Diabetes Other     Social History Social History  Substance Use Topics  . Smoking status: Never Smoker  . Smokeless tobacco: Never Used  . Alcohol use Yes     Comment: occasionally     Allergies   Review of patient's allergies indicates no known allergies.   Review of Systems Review of Systems  Constitutional: Negative for chills and fever.  HENT: Negative for congestion, rhinorrhea and sore throat.   Eyes: Negative for visual disturbance.  Respiratory: Negative for cough, shortness of breath and wheezing.   Cardiovascular: Positive for leg swelling. Negative for chest pain.  Gastrointestinal: Negative for abdominal pain, diarrhea, nausea and vomiting.  Genitourinary: Negative for dysuria, flank pain, vaginal bleeding and vaginal discharge.  Musculoskeletal: Negative for neck pain.  Skin: Negative for rash.  Allergic/Immunologic: Negative for immunocompromised state.  Neurological: Negative for syncope and headaches.  Hematological: Does not bruise/bleed easily.  All other systems reviewed and are negative.    Physical Exam Updated Vital Signs BP 119/81 (BP Location: Left Arm)   Pulse 61   Temp 97.6 F (36.4 C) (Oral)   Resp 16   Ht 5\' 7"  (1.702 m)   Wt 154 lb (69.9 kg)   LMP 09/29/2015   SpO2 99%  BMI 24.12 kg/m   Physical Exam  Constitutional: She is oriented to person, place, and time. She appears well-developed and well-nourished. No distress.  HENT:  Head: Normocephalic and atraumatic.  Eyes: Conjunctivae are normal.  Neck: Neck supple.  Cardiovascular: Normal rate, regular rhythm and normal heart sounds.  Exam reveals no friction rub.   No murmur heard. Pulmonary/Chest: Effort normal and breath sounds normal. No respiratory distress. She has no wheezes. She has no rales.  Abdominal: She exhibits no  distension.  Musculoskeletal: She exhibits no edema.  Neurological: She is alert and oriented to person, place, and time. She exhibits normal muscle tone.  Skin: Skin is warm. Capillary refill takes less than 2 seconds.  Psychiatric: She has a normal mood and affect.  Nursing note and vitals reviewed.   LOWER EXTREMITY EXAM: BILATERAL  INSPECTION & PALPATION: No appreciable swelling on inspection or palpation. Minimal calf TTP on right, no palpable cords. No redness or erythema. No drainage. No bruising.  SENSORY: sensation is intact to light touch in:  Superficial peroneal nerve distribution (over dorsum of foot) Deep peroneal nerve distribution (over first dorsal web space) Sural nerve distribution (over lateral aspect 5th metatarsal) Saphenous nerve distribution (over medial instep)  MOTOR:  + Motor EHL (great toe dorsiflexion) + FHL (great toe plantar flexion)  + TA (ankle dorsiflexion)  + GSC (ankle plantar flexion)  VASCULAR: 2+ dorsalis pedis and posterior tibialis pulses Capillary refill < 2 sec, toes warm and well-perfused  COMPARTMENTS: Soft, warm, well-perfused No pain with passive extension No parethesias   ED Treatments / Results  Labs (all labs ordered are listed, but only abnormal results are displayed) Labs Reviewed  BASIC METABOLIC PANEL - Abnormal; Notable for the following:       Result Value   Potassium 3.2 (*)    All other components within normal limits  CBC - Abnormal; Notable for the following:    WBC 11.8 (*)    All other components within normal limits  CK    EKG  EKG Interpretation None       Radiology No results found.  Procedures Procedures (including critical care time)  Medications Ordered in ED Medications  potassium chloride SA (K-DUR,KLOR-CON) CR tablet 40 mEq (40 mEq Oral Given 10/20/15 1909)     Initial Impression / Assessment and Plan / ED Course  I have reviewed the triage vital signs and the nursing  notes.  Pertinent labs & imaging results that were available during my care of the patient were reviewed by me and considered in my medical decision making (see chart for details).  Clinical Course    50 yo AAF with PMHx of DVT/PE on Eliquis here with subjective RLE swelling. This is the same leg as recently diagnosed DVT. Swelling improves with elevation. On arrival, VSS and WNL. Suspect ongoing edema 2/2 large clot burden, with possible post-DVT venous insufficiency. No appreciable swelling on my exam. No signs of phlegmasia. Distal NV is fully intact. Compartments are soft. Will obtain repeat U/S, screening labs. Otherwise, pt denies any CP, SOB, is not tachycardic, is not hypoxic, with normal WOB - do not suspect PE.  U/S shows overall unchanged to improved clot burden. Labs show mild hypokalemia but are o/w unremarkable. CK normal - no signs of myositis. Will d/c with continued Eliquis, strict return precautions.  Final Clinical Impressions(s) / ED Diagnoses   Final diagnoses:  Edema of right lower extremity  DVT (deep venous thrombosis), right  Hypokalemia  New Prescriptions Discharge Medication List as of 10/20/2015  6:16 PM       Shaune Pollack, MD 10/21/15 [phone]

## 2017-06-21 ENCOUNTER — Emergency Department (HOSPITAL_BASED_OUTPATIENT_CLINIC_OR_DEPARTMENT_OTHER)
Admit: 2017-06-21 | Discharge: 2017-06-21 | Disposition: A | Discharge status: Home / Self Care | Payer: Federal, State, Local not specified - PPO | Attending: Emergency Medicine | Admitting: Emergency Medicine

## 2017-06-21 ENCOUNTER — Encounter (HOSPITAL_COMMUNITY): Payer: Self-pay | Admitting: Nurse Practitioner

## 2017-06-21 ENCOUNTER — Emergency Department (HOSPITAL_COMMUNITY)
Admission: EM | Admit: 2017-06-21 | Discharge: 2017-06-21 | Disposition: A | Discharge status: Home / Self Care | Payer: Federal, State, Local not specified - PPO | Attending: Emergency Medicine | Admitting: Emergency Medicine

## 2017-06-21 ENCOUNTER — Other Ambulatory Visit: Payer: Self-pay

## 2017-06-21 DIAGNOSIS — R2241 Localized swelling, mass and lump, right lower limb: Secondary | ICD-10-CM | POA: Diagnosis not present

## 2017-06-21 DIAGNOSIS — M7989 Other specified soft tissue disorders: Secondary | ICD-10-CM | POA: Diagnosis not present

## 2017-06-21 DIAGNOSIS — Z7901 Long term (current) use of anticoagulants: Secondary | ICD-10-CM | POA: Insufficient documentation

## 2017-06-21 NOTE — Progress Notes (Signed)
Right lower extremity venous duplex has been completed. Negative for DVT. Results were given to Dr. Effie Shy.  06/21/17 7:52 PM Olen Cordial RVT

## 2017-06-21 NOTE — ED Triage Notes (Signed)
Patient had a blood clot and PE in 2017. Patient took blood thinners for about 6 months after clot resolved. Patient states yesterday she did a lot of walking and now the same leg right is swollen and feels tight. The leg is not red or painful but it is more tight the same way it felt as the other clot was forming last time. Patient has 3 pitting edema to the right leg.

## 2017-06-21 NOTE — Discharge Instructions (Addendum)
The testing today did not indicate that you had a blood clot in the right leg.  It is important to watch for continued swelling and pain, and get a repeat ultrasound if that happens.  In the meantime elevate both legs above your heart is much as you can during the day and night to decrease swelling.  Also consider getting compression stockings to wear to help with the swelling.  For pain, you can use Tylenol or Motrin.  Return here, if needed, for problems.

## 2017-06-21 NOTE — ED Provider Notes (Signed)
[facility]-EMERGENCY DEPT Provider Note   CSN: [id number] Arrival date & time: 06/21/17  1756     History   Chief Complaint No chief complaint on file.   HPI Lori Klein is a 52 y.o. female.  HPI  She presents for evaluation of right leg pain with swelling, typical of prior symptoms when she had a blood clot in the right leg.  Her discomfort reminds her of when she had a DVT in 2017 that was treated with anticoagulants for about 6 months.  No other DVTs or PEs.  She denies fever, chills, nausea, vomiting, cough, shortness of breath or chest pain.  She has not been having trouble walking.  Today she was uncomfortable at work so decided to come here for evaluation.  There are no other known modifying factors.   Past Medical History:  Diagnosis Date  . DVT (deep venous thrombosis) (HCC)   . Pulmonary embolism [facility])     Patient Active Problem List   Diagnosis Date Noted  . Bilateral pulmonary embolism (HCC) 09/27/2015  . Acute deep vein thrombosis (DVT) of right lower extremity (HCC) 09/27/2015    Past Surgical History:  Procedure Laterality Date  . CHOLECYSTECTOMY    . ulnar surgery       OB History   None      Home Medications    Prior to Admission medications   Medication Sig Start Date End Date Taking? Authorizing Provider  apixaban (ELIQUIS) 5 MG TABS tablet Take 2 tablets (10 mg total) by mouth 2 (two) times daily. Patient not taking: Reported on 10/20/2015 09/27/15 10/04/15  Dhungel, Theda Belfast, MD  apixaban (ELIQUIS) 5 MG TABS tablet Take 1 tablet (5 mg total) by mouth 2 (two) times daily. 10/04/15 12/27/15  Dhungel, Theda Belfast, MD  Multiple Vitamin (MULTIVITAMIN WITH MINERALS) TABS tablet Take 1 tablet by mouth daily.    [provider]  Norethindrone-Eth Estradiol (PIRMELLA 1/35 PO) Take 1 tablet by mouth daily.    [provider]    Family History Family History  Problem Relation Age of Onset  . Hypertension Mother     . Hypertension Father   . Diabetes Other     Social History Social History   Tobacco Use  . Smoking status: Never Smoker  . Smokeless tobacco: Never Used  Substance Use Topics  . Alcohol use: Yes    Comment: occasionally  . Drug use: No     Allergies   Patient has no known allergies.   Review of Systems Review of Systems  All other systems reviewed and are negative.    Physical Exam Updated Vital Signs BP 123/81 (BP Location: Left Arm)   Pulse 82   Temp 98.8 F (37.1 C) (Oral)   Resp 18   Ht  (1.676 m)   Wt 77.1 kg (170 lb)   SpO2 100%   BMI 27.44 kg/m   Physical Exam  Constitutional: She is oriented to person, place, and time. She appears well-developed and well-nourished.  HENT:  Head: Normocephalic and atraumatic.  Eyes: Pupils are equal, round, and reactive to light. Conjunctivae and EOM are normal.  Neck: Normal range of motion and phonation normal. Neck supple.  Cardiovascular: Normal rate.  Pulmonary/Chest: Effort normal.  Musculoskeletal: Normal range of motion.  Right lower leg swelling, as compared to right.  Mild tenderness right knee, right anterior shin and right calf.  Neurovascular intact distally in the right foot.  Negative straight leg raising and negative Homans.  Neurological: She is alert and oriented to person, place, and time. She exhibits normal muscle tone.  Skin: Skin is warm and dry. No rash noted. No erythema.  Psychiatric: She has a normal mood and affect. Her behavior is normal. Judgment and thought content normal.  Nursing note and vitals reviewed.    ED Treatments / Results  Labs (all labs ordered are listed, but only abnormal results are displayed) Labs Reviewed - No data to display  EKG None  Radiology No results found.  Procedures Procedures (including critical care time)  Medications Ordered in ED Medications - No data to display   Initial Impression / Assessment and Plan / ED Course  I have  reviewed the triage vital signs and the nursing notes.  Pertinent labs & imaging results that were available during my care of the patient were reviewed by me and considered in my medical decision making (see chart for details).      Patient Vitals for the past 24 hrs:  BP Temp Temp src Pulse Resp SpO2 Height Weight  06/21/17 1835 - - - - - -  (1.676 m) 77.1 kg (170 lb)  06/21/17 1825 123/81 98.8 F (37.1 C) Oral 82 18 100 % - -    8:11 PM Reevaluation with update and discussion. After initial assessment and treatment, an updated evaluation reveals no change in clinical status, findings discussed with patient and family members, all questions answered. Mancel Bale   Medical Decision Making: Nonspecific right lower leg pain with swelling, history of DVT in this leg.  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  At this time there is no ultrasound imaging evidence for DVT.  There is no sign of arterial insufficiency.  Doubt right knee or hip arthritis.  No indication for further evaluation or treatment in the ED or hospital setting at this time  CRITICAL CARE-no Performed by: Mancel Bale   Nursing Notes Reviewed/ Care Coordinated Applicable Imaging Reviewed Interpretation of Laboratory Data incorporated into ED treatment  The patient appears reasonably screened and/or stabilized for discharge and I doubt any other medical condition or other [facility] requiring further screening, evaluation, or treatment in the ED at this time prior to discharge.  Plan: Home Medications-continue usual medications use OTC analgesia of choice; Home Treatments-rest, elevation, consider compression stockings; return here if the recommended treatment, does not improve the symptoms; Recommended follow up-PCP follow-up 1 week for checkup and further evaluation if still symptomatic.  She may require vascular surgery follow-up.    Final Clinical Impressions(s) / ED Diagnoses   Final diagnoses:   Right leg swelling    ED Discharge Orders    None       Mancel Bale, MD 06/21/17 2012

## 2019-05-31 ENCOUNTER — Emergency Department (HOSPITAL_BASED_OUTPATIENT_CLINIC_OR_DEPARTMENT_OTHER)
Admission: EM | Admit: 2019-05-31 | Discharge: 2019-05-31 | Disposition: A | Discharge status: Home / Self Care | Payer: Federal, State, Local not specified - PPO | Attending: Emergency Medicine | Admitting: Emergency Medicine

## 2019-05-31 ENCOUNTER — Other Ambulatory Visit: Payer: Self-pay

## 2019-05-31 ENCOUNTER — Emergency Department (HOSPITAL_BASED_OUTPATIENT_CLINIC_OR_DEPARTMENT_OTHER): Payer: Federal, State, Local not specified - PPO

## 2019-05-31 ENCOUNTER — Encounter (HOSPITAL_BASED_OUTPATIENT_CLINIC_OR_DEPARTMENT_OTHER): Payer: Self-pay | Admitting: Emergency Medicine

## 2019-05-31 DIAGNOSIS — R519 Headache, unspecified: Secondary | ICD-10-CM | POA: Insufficient documentation

## 2019-05-31 DIAGNOSIS — Z9049 Acquired absence of other specified parts of digestive tract: Secondary | ICD-10-CM | POA: Insufficient documentation

## 2019-05-31 DIAGNOSIS — Z20822 Contact with and (suspected) exposure to covid-19: Secondary | ICD-10-CM | POA: Insufficient documentation

## 2019-05-31 LAB — CBC WITH DIFFERENTIAL/PLATELET
Abs Immature Granulocytes: 0.13 10*3/uL — ABNORMAL HIGH (ref 0.00–0.07)
Basophils Absolute: 0.1 10*3/uL (ref 0.0–0.1)
Basophils Relative: 0 %
Eosinophils Absolute: 0 10*3/uL (ref 0.0–0.5)
Eosinophils Relative: 0 %
HCT: 39 % (ref 36.0–46.0)
Hemoglobin: 13.6 g/dL (ref 12.0–15.0)
Immature Granulocytes: 1 %
Lymphocytes Relative: 3 %
Lymphs Abs: 0.6 10*3/uL — ABNORMAL LOW (ref 0.7–4.0)
MCH: 29.1 pg (ref 26.0–34.0)
MCHC: 34.9 g/dL (ref 30.0–36.0)
MCV: 83.3 fL (ref 80.0–100.0)
Monocytes Absolute: 0.9 10*3/uL (ref 0.1–1.0)
Monocytes Relative: 4 %
Neutro Abs: 19.1 10*3/uL — ABNORMAL HIGH (ref 1.7–7.7)
Neutrophils Relative %: 92 %
Platelets: 294 10*3/uL (ref 150–400)
RBC: 4.68 MIL/uL (ref 3.87–5.11)
RDW: 13.3 % (ref 11.5–15.5)
WBC: 20.8 10*3/uL — ABNORMAL HIGH (ref 4.0–10.5)
nRBC: 0 % (ref 0.0–0.2)

## 2019-05-31 LAB — BASIC METABOLIC PANEL
Anion gap: 10 (ref 5–15)
BUN: 8 mg/dL (ref 6–20)
CO2: 24 mmol/L (ref 22–32)
Calcium: 9 mg/dL (ref 8.9–10.3)
Chloride: 105 mmol/L (ref 98–111)
Creatinine, Ser: 0.67 mg/dL (ref 0.44–1.00)
GFR calc Af Amer: >60 mL/min (ref >60)
GFR calc non Af Amer: >60 mL/min (ref >60)
Glucose, Bld: 140 mg/dL — ABNORMAL HIGH (ref 70–99)
Potassium: 3.3 mmol/L — ABNORMAL LOW (ref 3.5–5.1)
Sodium: 139 mmol/L (ref 135–145)

## 2019-05-31 LAB — RESPIRATORY PANEL BY RT PCR (FLU A&B, COVID)
Influenza A by PCR: NEGATIVE
Influenza B by PCR: NEGATIVE
SARS Coronavirus 2 by RT PCR: NEGATIVE

## 2019-05-31 MED ORDER — METOCLOPRAMIDE HCL 5 MG/ML IJ SOLN
10.0000 mg | Freq: Once | INTRAMUSCULAR | Status: AC
  Administered 2019-05-31: 10 mg via INTRAVENOUS
  Filled 2019-05-31: qty 2

## 2019-05-31 MED ORDER — IOHEXOL 350 MG/ML SOLN
100.0000 mL | Freq: Once | INTRAVENOUS | Status: AC | PRN
  Administered 2019-05-31: 75 mL via INTRAVENOUS

## 2019-05-31 MED ORDER — DEXAMETHASONE SODIUM PHOSPHATE 10 MG/ML IJ SOLN
10.0000 mg | Freq: Once | INTRAMUSCULAR | Status: AC
  Administered 2019-05-31: 10 mg via INTRAVENOUS
  Filled 2019-05-31: qty 1

## 2019-05-31 MED ORDER — AMOXICILLIN-POT CLAVULANATE 875-125 MG PO TABS: 1.0000 | ORAL_TABLET | Freq: Two times a day (BID) | ORAL | 0 refills | Status: AC

## 2019-05-31 MED ORDER — AMOXICILLIN-POT CLAVULANATE 875-125 MG PO TABS
1.0000 | ORAL_TABLET | Freq: Once | ORAL | Status: AC
  Administered 2019-05-31: 1 via ORAL
  Filled 2019-05-31: qty 1

## 2019-05-31 MED ORDER — KETOROLAC TROMETHAMINE 15 MG/ML IJ SOLN
15.0000 mg | Freq: Once | INTRAMUSCULAR | Status: AC
  Administered 2019-05-31: 15 mg via INTRAVENOUS
  Filled 2019-05-31: qty 1

## 2019-05-31 MED ORDER — IBUPROFEN 600 MG PO TABS: 600.0000 mg | ORAL_TABLET | Freq: Four times a day (QID) | ORAL | 0 refills | Status: DC | PRN

## 2019-05-31 MED ORDER — DIPHENHYDRAMINE HCL 25 MG PO TABS: 25.0000 mg | ORAL_TABLET | Freq: Three times a day (TID) | ORAL | 0 refills | Status: DC | PRN

## 2019-05-31 MED ORDER — DIPHENHYDRAMINE HCL 50 MG/ML IJ SOLN
25.0000 mg | Freq: Once | INTRAMUSCULAR | Status: AC
  Administered 2019-05-31: 25 mg via INTRAVENOUS
  Filled 2019-05-31: qty 1

## 2019-05-31 MED FILL — BANOPHEN 25 MG CAPSULE: 25 | 33 days supply | Qty: 100 | Fill #0

## 2019-05-31 MED FILL — IBUPROFEN 600 MG TABLET: 600 | 7 days supply | Qty: 30 | Fill #0

## 2019-05-31 MED FILL — AMOX-CLAV 875-125 MG TABLET: 875-125 | 9 days supply | Qty: 18 | Fill #0

## 2019-05-31 NOTE — Discharge Instructions (Signed)
We discussed your workup in the ER today.  Your CT scan did not show signs of brain bleed or blood clots in your head.  It is possible you have a sinus infection, and so we started treatment with an antibiotic called Augmentin.  We also gave you some IV medications which you said substantially helped your headache.  We discussed your elevated white blood cell count.  I explained to you the possibility of a meningitis, or a brain fluid infection.  Although you clinically did not appear to have clear signs of meningitis, it is possibly early in its course.  I offered you an observation admission with a spinal tap to rule this out, versus the option of monitoring your symptoms at home.  You preferred to go home, and told me you would return to the ER immediately if worrisome symptoms develop.  Specifically, you should come back to the ER if you have worsening of your headache, fevers (temperature over 100.53F), neck stiffness (difficulty moving or pain in your neck), vision changes, severe vomiting, or confusion.  These may be signs of a serious infection that needs IV antibiotics.  For pain at home, you can take motrin 600 mg every 6 hours, and benadryl 25 mg before bedtime (to help you sleep).    You will also finish a course of augmentin for a possible sinusitis.  Finally, you should be seen in your doctor's office in 2-3 days if possible for a follow up visit.  Please bring your lab tests and CT report with you, as well as this paper.

## 2019-05-31 NOTE — ED Triage Notes (Signed)
Headache with nausea x 2 days.

## 2019-05-31 NOTE — ED Provider Notes (Signed)
[facility] EMERGENCY DEPARTMENT Provider Note   CSN: [id number] Arrival date & time: 05/31/19  0730     History Chief Complaint  Patient presents with  . Headache    Lori Klein is a 54 y.o. female with past medical history of migraines, DVT and pulmonary embolism, formally on Eliquis but taken off of it 1 year ago, presenting to the emergency department with headache.  She reports gradual onset of headache approximately 3 days ago.  She denies any falls or trauma preceding this.  She describes the headache as initially frontally located but now involving her entire head.  She states it hurts when her face presses against a pillow, like the entire frontal upper half of her face is throbbing. She describes some lightheadedness, nausea, occasional blurred vision.  She says it feels like the migraines she used to get years ago but much more intense.  She has not had a migraine in many years and is not on any medications for migraines. She denies numbness or weakness in her extremities.  She denies neck stiffness or rash.  Her husband at bedside denies that she has had confusion at home.  She denies to me that she is taking any kind of birth control or estrogen containing hormones currently.  She did receive both doses of her Covid vaccine, her second dose being about 2 weeks ago.  She denies any cough, sore throat, congestion or respiratory symptoms.  No leg swelling or tenderness.  HPI     Past Medical History:  Diagnosis Date  . DVT (deep venous thrombosis) ([place])   . Pulmonary embolism [facility])     Patient Active Problem List   Diagnosis Date Noted  . Bilateral pulmonary embolism ([place]) 09/27/2015  . Acute deep vein thrombosis (DVT) of right lower extremity ([place]) 09/27/2015    Past Surgical History:  Procedure Laterality Date  . CHOLECYSTECTOMY    . ulnar surgery       OB History   No obstetric history on file.     Family History  Problem Relation Age of  Onset  . Hypertension Mother   . Hypertension Father   . Diabetes Other     Social History   Tobacco Use  . Smoking status: Never Smoker  . Smokeless tobacco: Never Used  Substance Use Topics  . Alcohol use: Yes    Comment: occasionally  . Drug use: No    Home Medications Prior to Admission medications   Medication Sig Start Date End Date Taking? Authorizing Provider  amoxicillin-clavulanate (AUGMENTIN) 875-125 MG tablet Take 1 tablet by mouth every 12 (twelve) hours for 9 days. 05/31/19 06/09/19  Wyvonnia Dusky, MD  diclofenac (VOLTAREN) 75 MG EC tablet Take 75 mg by mouth 2 (two) times daily as needed. 05/28/19   [provider]  diphenhydrAMINE (BENADRYL) 25 MG tablet Take 1 tablet (25 mg total) by mouth every 8 (eight) hours as needed for up to 15 doses for sleep. 05/31/19   Wyvonnia Dusky, MD  ibuprofen (ADVIL) 600 MG tablet Take 1 tablet (600 mg total) by mouth every 6 (six) hours as needed for up to 30 doses for mild pain or moderate pain. 05/31/19   Wyvonnia Dusky, MD  Multiple Vitamin (MULTIVITAMIN WITH MINERALS) TABS tablet Take 1 tablet by mouth daily.    [provider]    Allergies    Patient has no known allergies.  Review of Systems   Review of Systems  Constitutional: Positive  for fatigue. Negative for chills.  HENT: Positive for sinus pressure and sinus pain. Negative for sore throat.   Eyes: Positive for pain and visual disturbance. Negative for redness.  Respiratory: Negative for cough and shortness of breath.   Cardiovascular: Negative for chest pain and palpitations.  Gastrointestinal: Positive for nausea. Negative for abdominal pain and vomiting.  Genitourinary: Negative for dysuria and hematuria.  Musculoskeletal: Negative for arthralgias, myalgias, neck pain and neck stiffness.  Skin: Negative for color change and rash.  Neurological: Positive for dizziness, light-headedness and headaches. Negative for seizures, syncope, speech  difficulty and numbness.  Psychiatric/Behavioral: Negative for agitation and confusion.  All other systems reviewed and are negative.   Physical Exam Updated Vital Signs BP 110/64   Pulse 86   Temp 98.2 F (36.8 C) (Oral)   Resp 16   Ht 5\' 7"  (1.702 m)   Wt 67.1 kg   SpO2 99%   BMI 23.18 kg/m   Physical Exam Vitals and nursing note reviewed.  Constitutional:      General: She is not in acute distress.    Appearance: She is well-developed.  HENT:     Head: Normocephalic and atraumatic.  Eyes:     Extraocular Movements: Extraocular movements intact.     Right eye: Normal extraocular motion and no nystagmus.     Left eye: Normal extraocular motion and no nystagmus.     Conjunctiva/sclera: Conjunctivae normal.     Pupils: Pupils are equal, round, and reactive to light.     Comments: Peripheral visual fields intact  Cardiovascular:     Rate and Rhythm: Normal rate and regular rhythm.     Heart sounds: Normal heart sounds.  Pulmonary:     Effort: Pulmonary effort is normal. No respiratory distress.     Breath sounds: Normal breath sounds.  Abdominal:     Palpations: Abdomen is soft.     Tenderness: There is no abdominal tenderness.  Musculoskeletal:     Cervical back: Normal range of motion and neck supple. No rigidity.  Skin:    General: Skin is warm and dry.  Neurological:     Mental Status: She is alert.     GCS: GCS eye subscore is 4. GCS verbal subscore is 5. GCS motor subscore is 6.     Cranial Nerves: No cranial nerve deficit or facial asymmetry.  Psychiatric:        Mood and Affect: Mood normal.        Behavior: Behavior normal.     ED Results / Procedures / Treatments   Labs (all labs ordered are listed, but only abnormal results are displayed) Labs Reviewed  BASIC METABOLIC PANEL - Abnormal; Notable for the following components:      Result Value   Potassium 3.3 (*)    Glucose, Bld 140 (*)    All other components within normal limits  CBC WITH  DIFFERENTIAL/PLATELET - Abnormal; Notable for the following components:   WBC 20.8 (*)    Neutro Abs 19.1 (*)    Lymphs Abs 0.6 (*)    Abs Immature Granulocytes 0.13 (*)    All other components within normal limits  RESPIRATORY PANEL BY RT PCR (FLU A&B, COVID)    EKG None  Radiology CT VENOGRAM HEAD  Result Date: 05/31/2019 CLINICAL DATA:  Frontal headache. Blurred vision. Previous venous thrombotic event. Not currently anticoagulated. EXAM: CT VENOGRAM HEAD CONTRAST:  8mL OMNIPAQUE IOHEXOL 350 MG/ML SOLN COMPARISON:  None. FINDINGS: Superior sagittal sinus is patent.  Deep veins are patent. Transverse and sigmoid sinuses are patent. The right is congenitally diminutive. Jugular veins show flow. Brain parenchyma appears normal. No evidence of focal lesion. No cause of headache identified. No hydrocephalus or extra-axial collection. Visualized sinuses are clear. IMPRESSION: Normal study. No evidence of intracranial venous thrombosis. No evidence of brain pathology. Visualized sinuses are clear. Electronically Signed   By: Paulina Fusi M.D.   On: 05/31/2019 10:36    Procedures Procedures (including critical care time)  Medications Ordered in ED Medications  ketorolac (TORADOL) 15 MG/ML injection 15 mg (15 mg Intravenous Given 05/31/19 0853)  metoCLOPramide (REGLAN) injection 10 mg (10 mg Intravenous Given 05/31/19 0900)  dexamethasone (DECADRON) injection 10 mg (10 mg Intravenous Given 05/31/19 0856)  diphenhydrAMINE (BENADRYL) injection 25 mg (25 mg Intravenous Given 05/31/19 0850)  iohexol (OMNIPAQUE) 350 MG/ML injection 100 mL (75 mLs Intravenous Contrast Given 05/31/19 1007)  amoxicillin-clavulanate (AUGMENTIN) 875-125 MG per tablet 1 tablet (1 tablet Oral Given 05/31/19 1203)    ED Course  I have reviewed the triage vital signs and the nursing notes.  Pertinent labs & imaging results that were available during my care of the patient were reviewed by me and considered in my medical  decision making (see chart for details).  54 year old female with a history of DVT and PE, off of anticoagulation for the past year, presented to emergency department with headache.  This occurred about 3 days ago and has been persistent and worsening.  She does have a remote history of migraines and says this feels similar to her migraines but more intense.  This involves an extensive number of treatment options, and is a complaint that carries with it a high risk of complications and morbidity.  The differential diagnosis includes cerebral venous thrombosis vs. Migraine vs. Sinus infection vs other  Less likely SAH with no acute onset of headache, no hx of aneurysm Less likely meningitis with no fever or neck stiffness  Less likely COVID with no other somatic complaints and she received both vaccine doses  I ordered, reviewed, and interpreted labs, which included BMP, CBC I ordered medication IV steroids, benadryl, toradol and reglan for possible migraine/headache I ordered imaging studies which included CT venogram   Clinical Course as of May 30 1741  Thu May 31, 2019  1039 IMPRESSION: Normal study. No evidence of intracranial venous thrombosis. No evidence of brain pathology. Visualized sinuses are clear.   [MT]  1121 I discussed with the patient the results of her CT scan as well as her blood tests.  The CT scan is reassuring and does not show a venous thrombosis. We discussed her elevated white blood cell count.  I explained to her this does raise concern for possible infection, although there are other causes of leukocytosis.  She does not appear to have any of the other signs or symptoms or stigmata of bacterial meningitis at this time, and her vitals have been stable since arrival.  She also reports that she feels dramatically better after her medications.  I offered her the option of an observation admission with a diagnostic lumbar puncture vs. close monitoring at home (with her  husband who lives with her), and immediate return to the ER if her headaches worsens, or she develops confusion, fevers, neck stiffness.  She would prefer to go home.  Given her clinical exam, I think this is a reasonable option at this time.  She does feel strongly she may have a sinus infection as her headache  and that the pain is frontally located near her sinuses.  We can initiate treatment with augmentin at this time.  She can f/u with her PCP in 2-3 days for re-evaluation.   [MT]    Clinical Course User Index [MT] Terald Sleeper, MD    Final Clinical Impression(s) / ED Diagnoses Final diagnoses:  Nonintractable headache, unspecified chronicity pattern, unspecified headache type    Rx / DC Orders ED Discharge Orders         Ordered    amoxicillin-clavulanate (AUGMENTIN) 875-125 MG tablet  Every 12 hours     05/31/19 1130    ibuprofen (ADVIL) 600 MG tablet  Every 6 hours PRN     05/31/19 1130    diphenhydrAMINE (BENADRYL) 25 MG tablet  Every 8 hours PRN     05/31/19 1130           Terald Sleeper, MD 05/31/19 1743

## 2021-01-16 IMAGING — CT CT VENOGRAM HEAD
3 of 9 series · 16 of 47 positions shown · IV contrast (omnipaque)
Comparison: None.

CLINICAL DATA: Frontal headache. Blurred vision. Previous venous
thrombotic event. Not currently anticoagulated.

EXAM:
CT VENOGRAM HEAD
CONTRAST:  75mL OMNIPAQUE IOHEXOL 350 MG/ML SOLN

[Series 5: cor head wo · coronal · 0.32mm/px · 1 of 66 slices shown]
[im 33/66  brain]
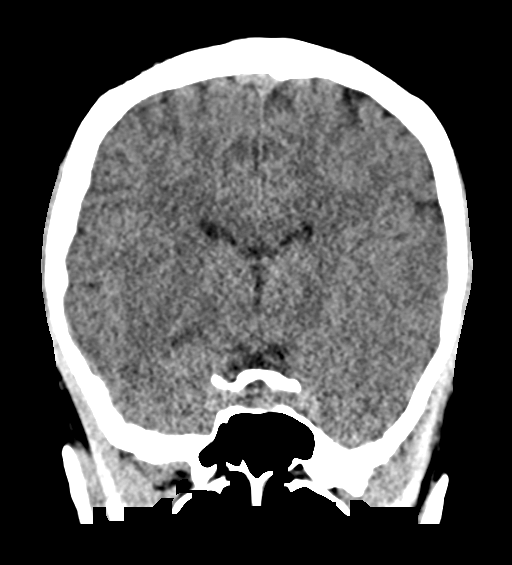

[Series 6: sag head wo · sagittal · 0.33mm/px · 1 of 51 slices shown]
[im 26/51  brain]
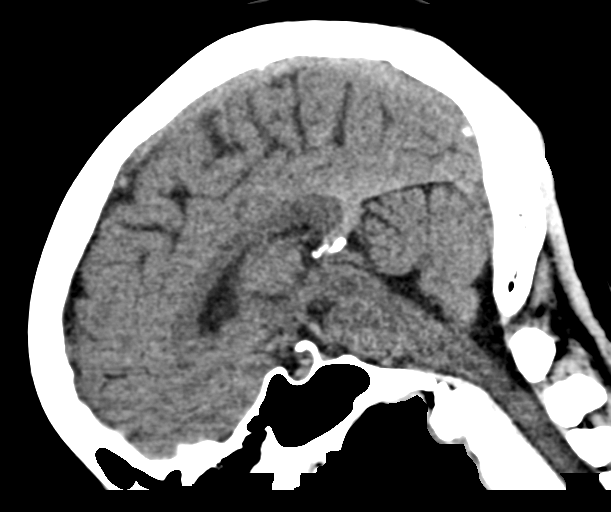

[Series 9: ax thin · axial · 0.33mm/px · z∈[+1177,+1333]mm · 14 of 118 slices shown]
[im 7/118  brain]
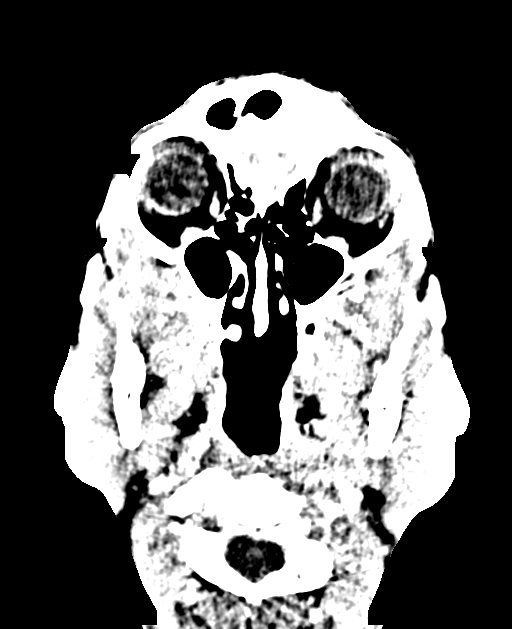
[im 14/118  bone]
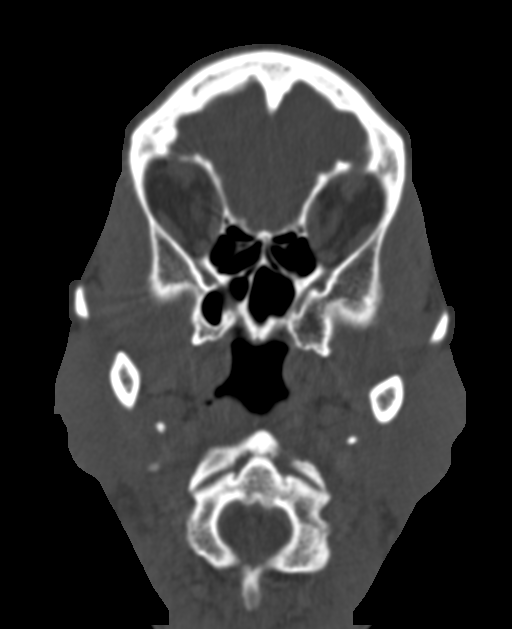
[im 27/118  brain]
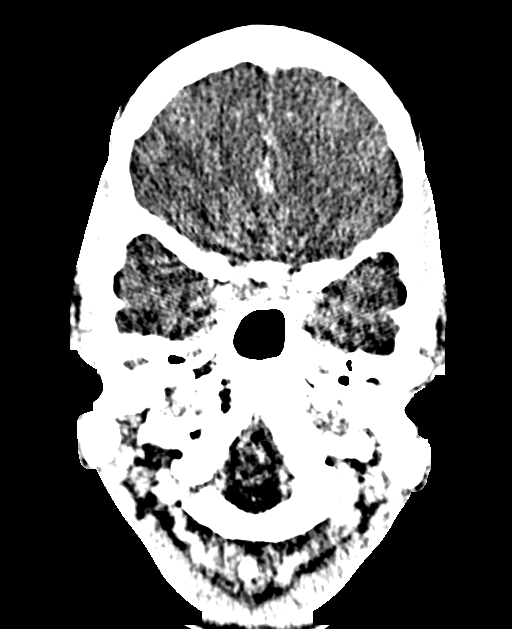
[im 33/118  bone]
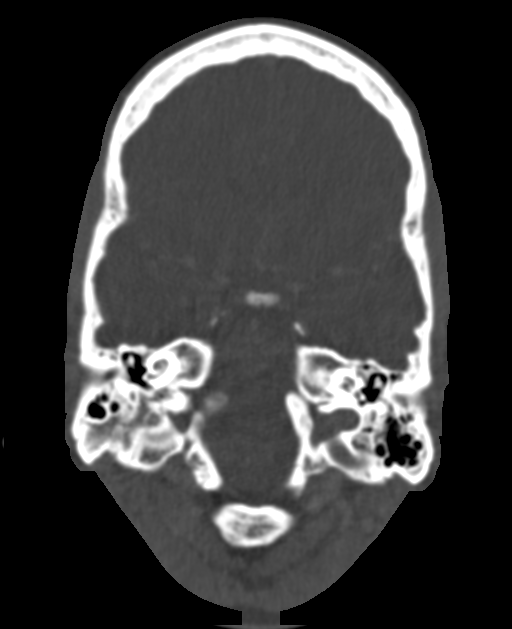
[im 40/118  brain]
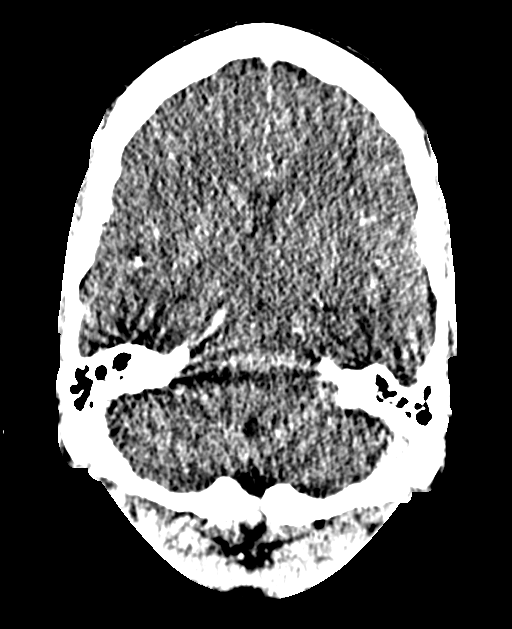
[im 46/118  bone]
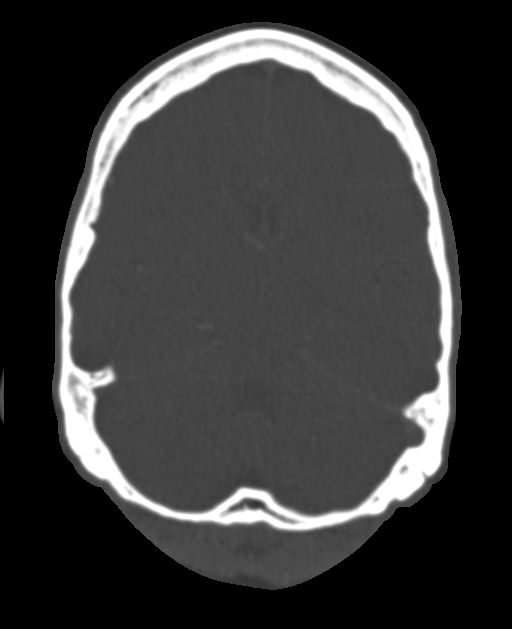
[im 53/118  brain]
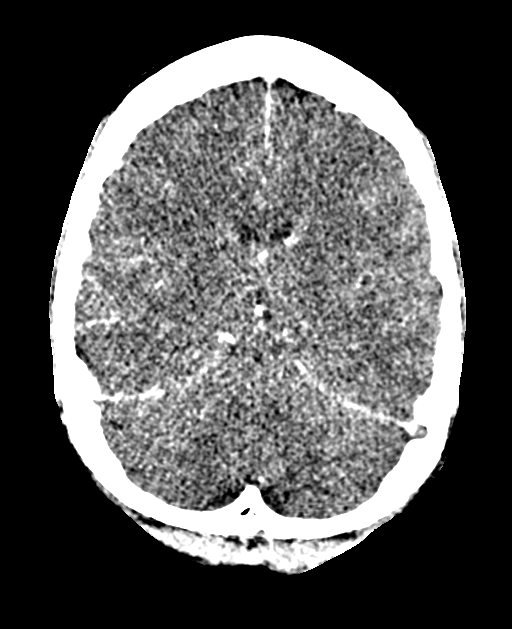
[im 66/118  bone]
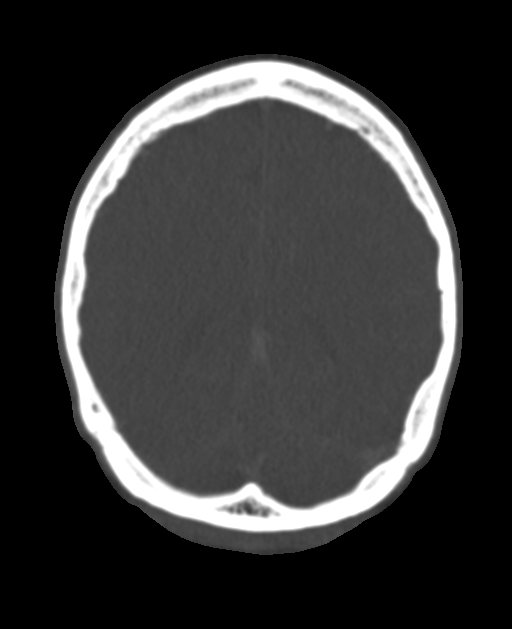
[im 72/118  brain]
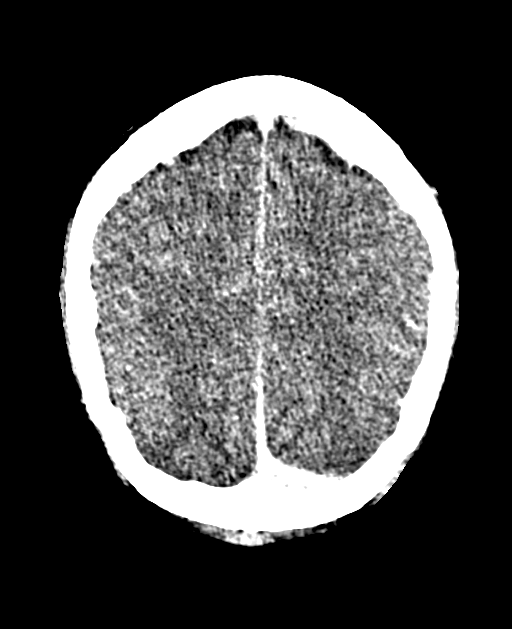
[im 79/118  bone]
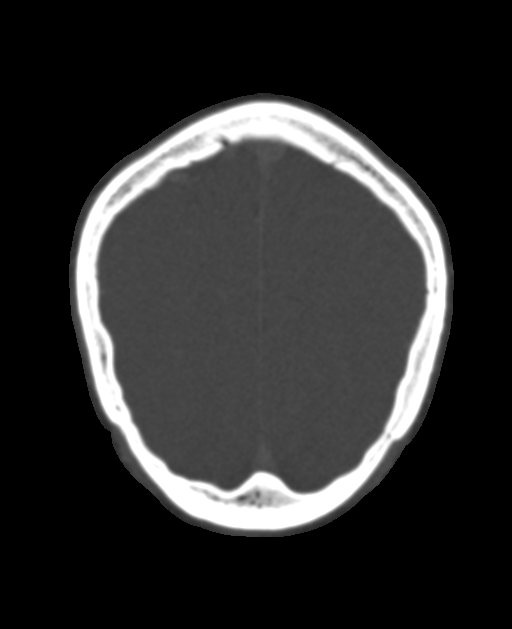
[im 85/118  brain]
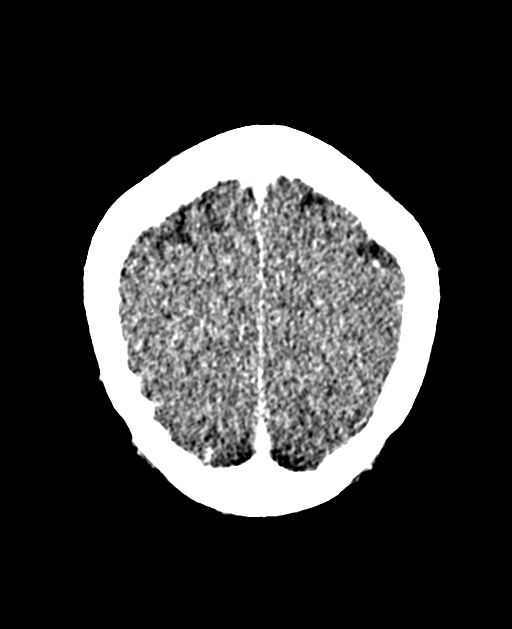
[im 92/118  bone]
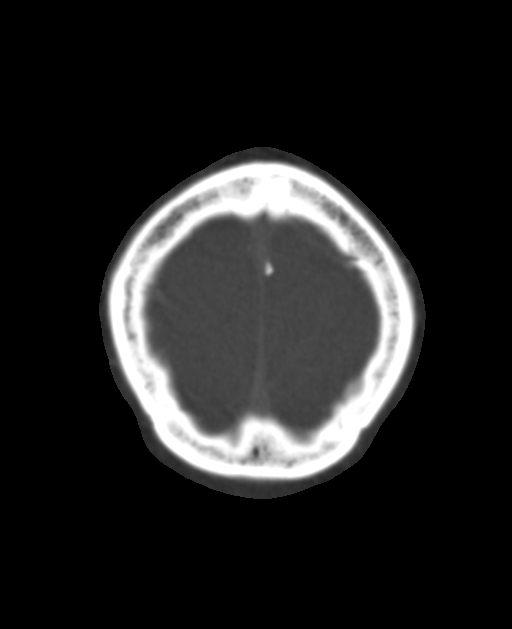
[im 105/118  brain]
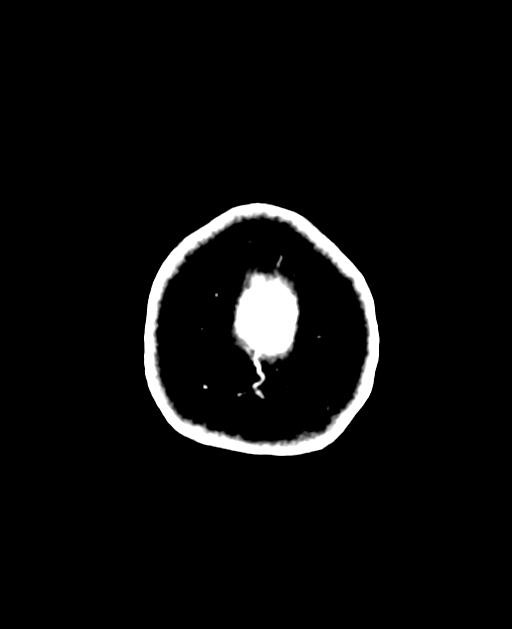
[im 111/118  bone]
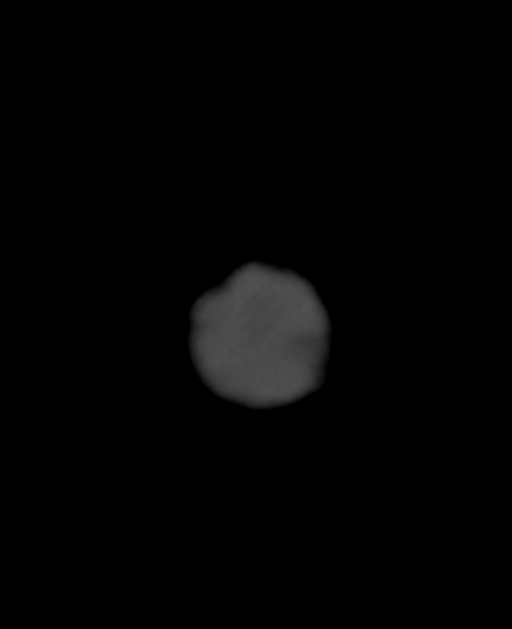

[16 of 47 positions shown; findings below may reference images not displayed]

FINDINGS: Superior sagittal sinus is patent. Deep veins are patent. Transverse
and sigmoid sinuses are patent. The right is congenitally
diminutive. Jugular veins show flow.

Brain parenchyma appears normal. No evidence of focal lesion. No
cause of headache identified. No hydrocephalus or extra-axial
collection. Visualized sinuses are clear.
IMPRESSION: Normal study. No evidence of intracranial venous thrombosis. No
evidence of brain pathology. Visualized sinuses are clear.

## 2022-03-03 ENCOUNTER — Telehealth: Payer: Self-pay

## 2022-03-03 NOTE — Telephone Encounter (Addendum)
Spoke with Marianna Fuss at [facility]/Dr. Lyla Glassing regarding request for IVC filter insertion on 04/12/22 with Dr. Donzetta Matters, prior to TKA surgery on 04/21/22 for history of DVT. Will contact patient to schedule procedure.

## 2022-03-05 ENCOUNTER — Other Ambulatory Visit: Payer: Self-pay

## 2022-03-05 DIAGNOSIS — Z86718 Personal history of other venous thrombosis and embolism: Secondary | ICD-10-CM

## 2022-04-02 NOTE — Progress Notes (Signed)
Sent message, via epic in basket, requesting orders in epic from surgeon.  

## 2022-04-05 ENCOUNTER — Ambulatory Visit: Payer: Self-pay | Admitting: Student

## 2022-04-08 NOTE — Progress Notes (Addendum)
COVID Vaccine received:  []  No [x]  Yes Date of any COVID positive Test in last 34 days:None  PCP - Truman Hayward, PA-C   at [facility]  [phone]  Fax) [phone]    Medical Clearance: 03-01-22 note in [facility] - none Vascular Surgeon- Dr. Servando Snare   Chest x-ray -  EKG -  done at [facility] Stress Test -  ECHO -  Cardiac Cath -   PCR screen: [x]  Ordered & Completed                      []   No Order but Needs PROFEND                      []   N/A for this surgery  Surgery Plan:  [x]  Ambulatory                            []  Outpatient in bed                            []  Admit  Anesthesia:    []  General  [x]  Spinal                           []   Choice []   MAC  Pacemaker / ICD device [x]  No []  Yes        Device order form faxed [x]  No    []   Yes      Faxed to:  Spinal Cord Stimulator:[x]  No []  Yes      (Remind patient to bring remote DOS) Other Implants:   History of Sleep Apnea? [x]  No []  Yes   CPAP used?- [x]  No []  Yes    Does the patient monitor blood sugar? []  No []  Yes  []  N/A   Diet only  Patient has: [x]  Pre-DM   []  DM1  []   DM2 Does patient have a [employer] or Dexacom? [x]  No []  Yes   Fasting Blood Sugar Ranges-  Checks Blood Sugar 0  times a day  Blood Thinner / Instructions: none Aspirin Instructions:  none  ERAS Protocol Ordered: []  No  [x]  Yes PRE-SURGERY []  ENSURE  [x]  G2  Patient is to be NPO after: 07:00 am  Comments: Dr. Donzetta Matters to put in IVC filter on 04-12-2022. Patient is currently not on any anticoagulation.   Activity level: Patient is able climb a flight of stairs without difficulty; [x]  No CP  [x]  No SOB, but would have leg pain. Patient can perform ADLs without assistance.   Anesthesia review: Hx DVT/ Bilateral PEs, Pre-DM, GAD, Migraine.   Patient denies shortness of breath, fever, cough and chest pain at PAT appointment.  Patient verbalized understanding and agreement to the Pre-Surgical Instructions that were  given to them at this PAT appointment. Patient was also educated of the need to review these PAT instructions again prior to her surgery.I reviewed the appropriate phone numbers to call if they have any and questions or concerns.

## 2022-04-08 NOTE — Patient Instructions (Addendum)
SURGICAL WAITING ROOM VISITATION Patients having surgery or a procedure may have no more than 2 support people in the waiting area - these visitors may rotate in the visitor waiting room.   Due to an increase in RSV and influenza rates and associated hospitalizations, children ages 35 and under may not visit patients in [place]. If the patient needs to stay at the hospital during part of their recovery, the visitor guidelines for inpatient rooms apply.  PRE-OP VISITATION  Pre-op nurse will coordinate an appropriate time for 1 support person to accompany the patient in pre-op.  This support person may not rotate.  This visitor will be contacted when the time is appropriate for the visitor to come back in the pre-op area.  Please refer to the [facility] website for the visitor guidelines for Inpatients (after your surgery is over and you are in a regular room).  You are not required to quarantine at this time prior to your surgery. However, you must do this: Hand Hygiene often Do NOT share personal items Notify your provider if you are in close contact with someone who has COVID or you develop fever 100.4 or greater, new onset of sneezing, cough, sore throat, shortness of breath or body aches.  If you test positive for Covid or have been in contact with anyone that has tested positive in the last 10 days please notify you surgeon.    Your procedure is scheduled on:  Wednesday   April 21, 2022  Report to [facility] Main Entrance: [address] entrance where the [employer] is available.   Report to admitting at: 07:30    AM  +++++Call this number if you have any questions or problems the morning of surgery [phone]  Do not eat food after Midnight the night prior to your surgery/procedure.  After Midnight you may have the following liquids until  07:00 AM DAY OF SURGERY  Clear Liquid Diet Water Black Coffee (sugar ok, NO MILK/CREAM OR CREAMERS)  Tea (sugar ok,  NO MILK/CREAM OR CREAMERS) regular and decaf                             Plain Jell-O  with no fruit (NO RED)                                           Fruit ices (not with fruit pulp, NO RED)                                     Popsicles (NO RED)                                                                  Juice: apple, WHITE grape, WHITE cranberry Sports drinks like Gatorade or Powerade (NO RED)                    The day of surgery:  Drink ONE (1) Pre-Surgery G2 at   07:00 AM the morning of surgery. Drink in one  sitting. Do not sip.  This drink was given to you during your hospital pre-op appointment visit. Nothing else to drink after completing the Pre-Surgery  G2 : No candy, chewing gum or throat lozenges.    FOLLOW ANY ADDITIONAL PRE OP INSTRUCTIONS YOU RECEIVED FROM YOUR SURGEON'S OFFICE!!!   Oral Hygiene is also important to reduce your risk of infection.        Remember - BRUSH YOUR TEETH THE MORNING OF SURGERY WITH YOUR REGULAR TOOTHPASTE  Do NOT smoke after Midnight the night before surgery.  Take ONLY these medicines the morning of surgery with A SIP OF WATER: escitalopram (Lexapro),  Buspirone (Buspar)                    You may not have any metal on your body including hair pins, jewelry, and body piercing  Do not wear make-up, lotions, powders, perfumes, or deodorant  Do not wear nail polish including gel and S&S, artificial / acrylic nails, or any other type of covering on natural nails including finger and toenails. If you have artificial nails, gel coating, etc., that needs to be removed by a nail salon, Please have this removed prior to surgery. Not doing so may mean that your surgery could be cancelled or delayed if the Surgeon or anesthesia staff feels like they are unable to monitor you safely.   Do not shave 48 hours prior to surgery to avoid nicks in your skin which may contribute to postoperative infections.   Contacts, Hearing Aids, dentures or  bridgework may not be worn into surgery.   Patients discharged on the day of surgery will not be allowed to drive home.  Someone NEEDS to stay with you for the first 24 hours after anesthesia.  Do not bring your home medications to the hospital. The Pharmacy will dispense medications listed on your medication list to you during your admission in the Hospital.  Special Instructions: Bring a copy of your healthcare power of attorney and living will documents the day of surgery, if you wish to have them scanned into your [facility] Records- EPIC  Please read over the following fact sheets you were given: IF YOU HAVE QUESTIONS ABOUT YOUR [place], [place] [phone].   [facility] - Preparing for Surgery Before surgery, you can play an important role.  Because skin is not sterile, your skin needs to be as free of germs as possible.  You can reduce the number of germs on your skin by washing with CHG (chlorahexidine gluconate) soap before surgery.  CHG is an antiseptic cleaner which kills germs and bonds with the skin to continue killing germs even after washing. Please DO NOT use if you have an allergy to CHG or antibacterial soaps.  If your skin becomes reddened/irritated stop using the CHG and inform your nurse when you arrive at Short Stay. Do not shave (including legs and underarms) for at least 48 hours prior to the first CHG shower.  You may shave your face/neck.  Please follow these instructions carefully:  1.  Shower with CHG Soap the night before surgery and the  morning of surgery.  2.  If you choose to wash your hair, wash your hair first as usual with your normal  shampoo.  3.  After you shampoo, rinse your hair and body thoroughly to remove the shampoo.  4.  Use CHG as you would any other liquid soap.  You can apply chg directly to the skin and wash.  Gently with a scrungie or clean washcloth.  5.  Apply the CHG Soap to your body ONLY  FROM THE NECK DOWN.   Do not use on face/ open                           Wound or open sores. Avoid contact with eyes, ears mouth and genitals (private parts).                       Wash face,  Genitals (private parts) with your normal soap.             6.  Wash thoroughly, paying special attention to the area where your  surgery  will be performed.  7.  Thoroughly rinse your body with warm water from the neck down.  8.  DO NOT shower/wash with your normal soap after using and rinsing off the CHG Soap.            9.  Pat yourself dry with a clean towel.            10.  Wear clean pajamas.            11.  Place clean sheets on your bed the night of your first shower and do not  sleep with pets.  ON THE DAY OF SURGERY : Do not apply any lotions/deodorants the morning of surgery.  Please wear clean clothes to the hospital/surgery center.    FAILURE TO FOLLOW THESE INSTRUCTIONS MAY RESULT IN THE CANCELLATION OF YOUR SURGERY  PATIENT SIGNATURE_________________________________  NURSE SIGNATURE__________________________________  ________________________________________________________________________        Adam Phenix    An incentive spirometer is a tool that can help keep your lungs clear and active. This tool measures how well you are filling your lungs with each breath. Taking long deep breaths may help reverse or decrease the chance of developing breathing (pulmonary) problems (especially infection) following: A long period of time when you are unable to move or be active. BEFORE THE PROCEDURE  If the spirometer includes an indicator to show your best effort, your nurse or respiratory therapist will set it to a desired goal. If possible, sit up straight or lean slightly forward. Try not to slouch. Hold the incentive spirometer in an upright position. INSTRUCTIONS FOR USE  Sit on the edge of your bed if possible, or sit up as far as you can in bed or on a chair. Hold the  incentive spirometer in an upright position. Breathe out normally. Place the mouthpiece in your mouth and seal your lips tightly around it. Breathe in slowly and as deeply as possible, raising the piston or the ball toward the top of the column. Hold your breath for 3-5 seconds or for as long as possible. Allow the piston or ball to fall to the bottom of the column. Remove the mouthpiece from your mouth and breathe out normally. Rest for a few seconds and repeat Steps 1 through 7 at least 10 times every 1-2 hours when you are awake. Take your time and take a few normal breaths between deep breaths. The spirometer may include an indicator to show your best effort. Use the indicator as a goal to work toward during each repetition. After each set of 10 deep breaths, practice coughing to  be sure your lungs are clear. If you have an incision (the cut made at the time of surgery), support your incision when coughing by placing a pillow or rolled up towels firmly against it. Once you are able to get out of bed, walk around indoors and cough well. You may stop using the incentive spirometer when instructed by your caregiver.  RISKS AND COMPLICATIONS Take your time so you do not get dizzy or light-headed. If you are in pain, you may need to take or ask for pain medication before doing incentive spirometry. It is harder to take a deep breath if you are having pain. AFTER USE Rest and breathe slowly and easily. It can be helpful to keep track of a log of your progress. Your caregiver can provide you with a simple table to help with this. If you are using the spirometer at home, follow these instructions: [place] IF:  You are having difficultly using the spirometer. You have trouble using the spirometer as often as instructed. Your pain medication is not giving enough relief while using the spirometer. You develop fever of 100.5 F (38.1 C) or higher.                                                                                                     SEEK IMMEDIATE MEDICAL CARE IF:  You cough up bloody sputum that had not been present before. You develop fever of 102 F (38.9 C) or greater. You develop worsening pain at or near the incision site. MAKE SURE YOU:  Understand these instructions. Will watch your condition. Will get help right away if you are not doing well or get worse. Document Released: 05/31/2006 Document Revised: 04/12/2011 Document Reviewed: 08/01/2006 [facility] Patient Information 2014 [place], [place].

## 2022-04-09 ENCOUNTER — Other Ambulatory Visit (HOSPITAL_COMMUNITY): Payer: Federal, State, Local not specified - PPO

## 2022-04-09 ENCOUNTER — Encounter (HOSPITAL_COMMUNITY)
Admission: RE | Admit: 2022-04-09 | Discharge: 2022-04-09 | Disposition: A | Discharge status: Home / Self Care | Payer: Federal, State, Local not specified - PPO | Source: Ambulatory Visit | Attending: Orthopedic Surgery | Admitting: Orthopedic Surgery

## 2022-04-09 ENCOUNTER — Other Ambulatory Visit: Payer: Self-pay

## 2022-04-09 ENCOUNTER — Encounter (HOSPITAL_COMMUNITY): Payer: Self-pay

## 2022-04-09 VITALS — BP 142/77 | HR 64 | Temp 98.4°F | Resp 14 | Ht 67.0 in | Wt 164.0 lb

## 2022-04-09 DIAGNOSIS — Z86711 Personal history of pulmonary embolism: Secondary | ICD-10-CM | POA: Diagnosis not present

## 2022-04-09 DIAGNOSIS — R7303 Prediabetes: Secondary | ICD-10-CM

## 2022-04-09 DIAGNOSIS — Z01818 Encounter for other preprocedural examination: Secondary | ICD-10-CM

## 2022-04-09 DIAGNOSIS — M1712 Unilateral primary osteoarthritis, left knee: Secondary | ICD-10-CM | POA: Diagnosis not present

## 2022-04-09 DIAGNOSIS — Z409 Encounter for prophylactic surgery, unspecified: Secondary | ICD-10-CM | POA: Diagnosis present

## 2022-04-09 DIAGNOSIS — Z86718 Personal history of other venous thrombosis and embolism: Secondary | ICD-10-CM | POA: Diagnosis not present

## 2022-04-09 HISTORY — DX: Other specified postprocedural states: Z98.890

## 2022-04-09 HISTORY — DX: Disease of blood and blood-forming organs, unspecified: D75.9

## 2022-04-09 HISTORY — DX: Nausea with vomiting, unspecified: R11.2

## 2022-04-09 HISTORY — DX: Unspecified osteoarthritis, unspecified site: M19.90

## 2022-04-09 HISTORY — DX: Headache, unspecified: R51.9

## 2022-04-09 HISTORY — DX: Anxiety disorder, unspecified: F41.9

## 2022-04-09 LAB — BASIC METABOLIC PANEL
Anion gap: 10 (ref 5–15)
BUN: 18 mg/dL (ref 6–20)
CO2: 26 mmol/L (ref 22–32)
Calcium: 9.1 mg/dL (ref 8.9–10.3)
Chloride: 104 mmol/L (ref 98–111)
Creatinine, Ser: 0.82 mg/dL (ref 0.44–1.00)
GFR, Estimated: >60 mL/min (ref >60)
Glucose, Bld: 104 mg/dL — ABNORMAL HIGH (ref 70–99)
Potassium: 3.7 mmol/L (ref 3.5–5.1)
Sodium: 140 mmol/L (ref 135–145)

## 2022-04-09 LAB — CBC
HCT: 40.7 % (ref 36.0–46.0)
Hemoglobin: 13.4 g/dL (ref 12.0–15.0)
MCH: 28.2 pg (ref 26.0–34.0)
MCHC: 32.9 g/dL (ref 30.0–36.0)
MCV: 85.5 fL (ref 80.0–100.0)
Platelets: 290 10*3/uL (ref 150–400)
RBC: 4.76 MIL/uL (ref 3.87–5.11)
RDW: 13 % (ref 11.5–15.5)
WBC: 8.6 10*3/uL (ref 4.0–10.5)
nRBC: 0 % (ref 0.0–0.2)

## 2022-04-09 LAB — SURGICAL PCR SCREEN
MRSA, PCR: NEGATIVE
Staphylococcus aureus: NEGATIVE

## 2022-04-09 LAB — GLUCOSE, CAPILLARY: Glucose-Capillary: 111 mg/dL — ABNORMAL HIGH (ref 70–99)

## 2022-04-12 ENCOUNTER — Ambulatory Visit (HOSPITAL_COMMUNITY)
Admission: RE | Disposition: A | Discharge status: Home / Self Care | Payer: Self-pay | Source: Home / Self Care | Attending: Vascular Surgery

## 2022-04-12 ENCOUNTER — Ambulatory Visit (HOSPITAL_COMMUNITY)
Admission: RE | Admit: 2022-04-12 | Discharge: 2022-04-12 | Disposition: A | Discharge status: Home / Self Care | Payer: Federal, State, Local not specified - PPO | Attending: Vascular Surgery | Admitting: Vascular Surgery

## 2022-04-12 DIAGNOSIS — Z409 Encounter for prophylactic surgery, unspecified: Secondary | ICD-10-CM | POA: Diagnosis not present

## 2022-04-12 DIAGNOSIS — Z86718 Personal history of other venous thrombosis and embolism: Secondary | ICD-10-CM | POA: Diagnosis not present

## 2022-04-12 DIAGNOSIS — R7303 Prediabetes: Secondary | ICD-10-CM | POA: Insufficient documentation

## 2022-04-12 DIAGNOSIS — M25562 Pain in left knee: Secondary | ICD-10-CM

## 2022-04-12 DIAGNOSIS — M1712 Unilateral primary osteoarthritis, left knee: Secondary | ICD-10-CM | POA: Insufficient documentation

## 2022-04-12 DIAGNOSIS — Z86711 Personal history of pulmonary embolism: Secondary | ICD-10-CM | POA: Diagnosis not present

## 2022-04-12 HISTORY — PX: IVC FILTER INSERTION: CATH118245

## 2022-04-12 HISTORY — PX: IVC VENOGRAPHY: CATH118301

## 2022-04-12 SURGERY — IVC FILTER INSERTION
Anesthesia: LOCAL

## 2022-04-12 MED ORDER — SODIUM CHLORIDE 0.9 % IV SOLN: INTRAVENOUS | Status: DC

## 2022-04-12 MED ORDER — MIDAZOLAM HCL 5 MG/5ML IJ SOLN
INTRAMUSCULAR | Status: AC
  Filled 2022-04-12: qty 5

## 2022-04-12 MED ORDER — SODIUM CHLORIDE 0.9% FLUSH: 3.0000 mL | INTRAVENOUS | Status: DC | PRN

## 2022-04-12 MED ORDER — IODIXANOL 320 MG/ML IV SOLN
INTRAVENOUS | Status: DC | PRN
  Administered 2022-04-12: 10 mL via INTRAVENOUS

## 2022-04-12 MED ORDER — MIDAZOLAM HCL 2 MG/2ML IJ SOLN
INTRAMUSCULAR | Status: DC | PRN
  Administered 2022-04-12: 1 mg via INTRAVENOUS

## 2022-04-12 MED ORDER — HYDRALAZINE HCL 20 MG/ML IJ SOLN: 5.0000 mg | INTRAMUSCULAR | Status: DC | PRN

## 2022-04-12 MED ORDER — SODIUM CHLORIDE 0.9 % IV SOLN: 250.0000 mL | INTRAVENOUS | Status: DC | PRN

## 2022-04-12 MED ORDER — FENTANYL CITRATE (PF) 100 MCG/2ML IJ SOLN
INTRAMUSCULAR | Status: AC
  Filled 2022-04-12: qty 2

## 2022-04-12 MED ORDER — SODIUM CHLORIDE 0.9% FLUSH: 3.0000 mL | Freq: Two times a day (BID) | INTRAVENOUS | Status: DC

## 2022-04-12 MED ORDER — LIDOCAINE HCL (PF) 1 % IJ SOLN
INTRAMUSCULAR | Status: DC | PRN
  Administered 2022-04-12: 10 mL

## 2022-04-12 MED ORDER — FENTANYL CITRATE (PF) 100 MCG/2ML IJ SOLN
INTRAMUSCULAR | Status: DC | PRN
  Administered 2022-04-12: 50 ug via INTRAVENOUS

## 2022-04-12 MED ORDER — LIDOCAINE HCL (PF) 1 % IJ SOLN
INTRAMUSCULAR | Status: AC
  Filled 2022-04-12: qty 30

## 2022-04-12 MED ORDER — HEPARIN (PORCINE) IN NACL 1000-0.9 UT/500ML-% IV SOLN
INTRAVENOUS | Status: DC | PRN
  Administered 2022-04-12: 500 mL

## 2022-04-12 MED ORDER — LABETALOL HCL 5 MG/ML IV SOLN: 10.0000 mg | INTRAVENOUS | Status: DC | PRN

## 2022-04-12 SURGICAL SUPPLY — 11 items
COVER DOME SNAP 22 D (MISCELLANEOUS) IMPLANT
FILTER VC CELECT-FEMORAL (Filter) IMPLANT
KIT MICROPUNCTURE NIT STIFF (SHEATH) IMPLANT
KIT PV (KITS) ×1 IMPLANT
PROTECTION STATION PRESSURIZED (MISCELLANEOUS) ×1
SHEATH PROBE COVER 6X72 (BAG) IMPLANT
STATION PROTECTION PRESSURIZED (MISCELLANEOUS) IMPLANT
SYR MEDRAD MARK 7 150ML (SYRINGE) ×1 IMPLANT
TRANSDUCER W/STOPCOCK (MISCELLANEOUS) ×1 IMPLANT
TRAY PV CATH (CUSTOM PROCEDURE TRAY) ×1 IMPLANT
WIRE BENTSON .035X145CM (WIRE) IMPLANT

## 2022-04-12 NOTE — Op Note (Signed)
    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 Donzetta Matters, MD Procedure Performed: 1.  Ultrasound-guided cannulation left common femoral vein 2.  Central venogram 3.  Placement of Cook select IVC filter in the infrarenal position 4.  Moderate sedation with fentanyl and Versed for 11 minutes  Indications: 57 year old female with a history of DVT and PE.  She is now indicated for total knee arthroplasty on the left followed by likely the right we are going to place a filter to protect from PE perioperatively.  Findings: Right common femoral vein.  Sclerotic possibly from previous DVT.  Left common femoral vein was diminutive although patent.  The IVC measured less than 20 mm filter was placed in infrarenal position with midline.  Plan will be to have patient follow-up after both of her knees are in place which should be later this year at which time we can consider filter removal.   Procedure:  The patient was identified in the holding area and taken to room 2.  The patient was then placed supine on the table and prepped and draped in the usual sterile fashion.  A time out was called.  Ultrasound was used to evaluate the first right common femoral vein which was very diminutive and possibly had some scar tissue.  We then evaluated the left common femoral vein which was also diminutive but was easily compressible.  The area was anesthetized 1% lidocaine and at the same time fentanyl and Versed was administered and her vital signs were monitored throughout the case by bedside nursing.  We then placed a micropuncture needle followed by wire and sheath in the left common femoral vein and the wire passed centrally quite easily.  A Bentson wire was placed and the introducer sheath was then placed under fluoroscopic  guidance.  Central venogram was performed.  We marked the renal veins placed the filter to the level of deployed.  Sheath was removed and pressure was held until hemostasis obtained.  Patient tolerated procedure without any complication.  Contrast: 10cc    C. Donzetta Matters, MD [facility] Office: [phone] Pager: [phone]

## 2022-04-12 NOTE — H&P (Signed)
H+P   History of Present Illness: This is a 57 y.o. female history of DVT and PE in the past.  Now planned for total knee arthroplasty on the left.  She does not take blood thinners longer.  Never had a filter before.  Also has plans for a right total knee arthroplasty in the future.  Past Medical History:  Diagnosis Date   Anxiety    Arthritis    Blood dyscrasia    has sickle cell trait   DVT (deep venous thrombosis) (HCC)    Headache    migraine   PONV (postoperative nausea and vomiting)    Pulmonary embolism [facility])     Past Surgical History:  Procedure Laterality Date   CHOLECYSTECTOMY  2008   laparoscopic   ulnar surgery Left 2017   released compressed nerve   WRIST SURGERY Right    nerve de fragmentation    No Known Allergies  Prior to Admission medications   Medication Sig Start Date End Date Taking? Authorizing Provider  ascorbic acid (VITAMIN C) 500 MG tablet Take 500 mg by mouth daily.   Yes [provider]  atorvastatin (LIPITOR) 40 MG tablet Take 40 mg by mouth daily.   Yes [provider]  Biotin 1000 MCG tablet Take 1,000 mcg by mouth daily.   Yes [provider]  busPIRone (BUSPAR) 10 MG tablet Take 20 mg by mouth 2 (two) times daily.   Yes [provider]  diclofenac (VOLTAREN) 75 MG EC tablet Take 75 mg by mouth 2 (two) times daily. 05/28/19  Yes [provider]  escitalopram (LEXAPRO) 20 MG tablet Take 20 mg by mouth daily. 08/29/21  Yes [provider]  vitamin B-12 (CYANOCOBALAMIN) 500 MCG tablet Take 500 mcg by mouth daily.   Yes [provider]  vitamin E 180 MG (400 UNITS) capsule Take 400 Units by mouth daily.   Yes [provider]  SUMAtriptan (IMITREX) 100 MG tablet Take 100 mg by mouth every 2 (two) hours as needed for migraine. 12/29/20   [provider]    Social History   Socioeconomic History   Marital status: Married    Spouse name: Not on file   Number of  children: Not on file   Years of education: Not on file   Highest education level: Not on file  Occupational History   Not on file  Tobacco Use   Smoking status: Never   Smokeless tobacco: Never  Vaping Use   Vaping Use: Never used  Substance and Sexual Activity   Alcohol use: Yes    Comment: occasionally   Drug use: No   Sexual activity: Yes  Other Topics Concern   Not on file  Social History Narrative   Not on file   Social Determinants of Health   Financial Resource Strain: Not on file  Food Insecurity: Not on file  Transportation Needs: Not on file  Physical Activity: Not on file  Stress: Not on file  Social Connections: Not on file  Intimate Partner Violence: Not on file     Family History  Problem Relation Age of Onset   Hypertension Mother    Hypertension Father    Diabetes Other     ROS: No complaints today   Physical Examination  Vitals:   04/12/22 0602  BP: 121/71  Pulse: 70  Resp: 16  Temp: 98.2 F (36.8 C)  SpO2: 96%   Body mass index is 24.28 kg/m.  Awake alert oriented Nonlabored  respirations   CBC    Component Value Date/Time   WBC 8.6 04/09/2022 1333   RBC 4.76 04/09/2022 1333   HGB 13.4 04/09/2022 1333   HCT 40.7 04/09/2022 1333   PLT 290 04/09/2022 1333   MCV 85.5 04/09/2022 1333   MCH 28.2 04/09/2022 1333   MCHC 32.9 04/09/2022 1333   RDW 13.0 04/09/2022 1333   LYMPHSABS 0.6 (L) 05/31/2019 0945   MONOABS 0.9 05/31/2019 0945   EOSABS 0.0 05/31/2019 0945   BASOSABS 0.1 05/31/2019 0945    BMET    Component Value Date/Time   NA 140 04/09/2022 1333   K 3.7 04/09/2022 1333   CL 104 04/09/2022 1333   CO2 26 04/09/2022 1333   GLUCOSE 104 (H) 04/09/2022 1333   BUN 18 04/09/2022 1333   CREATININE 0.82 04/09/2022 1333   CALCIUM 9.1 04/09/2022 1333   GFRNONAA >60 04/09/2022 1333   GFRAA >60 05/31/2019 0945    COAGS: Lab Results  Component Value Date   INR 1.16 09/26/2015     Non-Invasive Vascular Imaging:   No  studies  ASSESSMENT/PLAN: This is a 57 y.o. female planned for left total knee arthroplasty in the near future with history of PE not currently anticoagulated.  She will need subsequent right total knee arthroplasty in the near future.  Plan will be for IVC filter placement today and I discussed with her having her follow-up later this year to discuss filter removal unless she has not had the second total knee or has not recovered from either of them at which time we would leave until she has fully recovered and all of her orthopedic procedures are completed.  She demonstrates good understanding and her husband is in the waiting lobby today.   C. Donzetta Matters, MD [facility] Office: [phone] Pager: [phone]

## 2022-04-12 NOTE — Progress Notes (Signed)
Pt ambulated to and from bathroom to void with no signs of oozing from groin site  

## 2022-04-13 ENCOUNTER — Encounter (HOSPITAL_COMMUNITY): Payer: Self-pay | Admitting: Vascular Surgery

## 2022-04-13 ENCOUNTER — Encounter (HOSPITAL_COMMUNITY): Payer: Self-pay | Admitting: Physician Assistant

## 2022-04-13 MED FILL — Midazolam HCl Inj 5 MG/5ML (Base Equivalent): INTRAMUSCULAR | Qty: 1 | Status: AC

## 2022-04-15 ENCOUNTER — Ambulatory Visit: Payer: Self-pay | Admitting: Student

## 2022-04-15 NOTE — H&P (View-Only) (Signed)
TOTAL KNEE ADMISSION H&P  Patient is being admitted for left total knee arthroplasty.  Subjective:  Chief Complaint:left knee pain.  HPI: Lori Klein, 56 y.o. female, has a history of pain and functional disability in the left knee due to arthritis and has failed non-surgical conservative treatments for greater than 12 weeks to includeNSAID's and/or analgesics, corticosteriod injections, flexibility and strengthening excercises, use of assistive devices, and activity modification.  Onset of symptoms was gradual, starting 10 years ago with rapidlly worsening course since that time. The patient noted no past surgery on the left knee(s).  Patient currently rates pain in the left knee(s) at 10 out of 10 with activity. Patient has night pain, worsening of pain with activity and weight bearing, pain that interferes with activities of daily living, pain with passive range of motion, crepitus, and joint swelling.  Patient has evidence of subchondral cysts, subchondral sclerosis, periarticular osteophytes, and joint space narrowing by imaging studies. There is no active infection.  Patient Active Problem List   Diagnosis Date Noted   Bilateral pulmonary embolism (HCC) 09/27/2015   Acute deep vein thrombosis (DVT) of right lower extremity (HCC) 09/27/2015   Past Medical History:  Diagnosis Date   Anxiety    Arthritis    Blood dyscrasia    has sickle cell trait   DVT (deep venous thrombosis) (HCC)    Headache    migraine   PONV (postoperative nausea and vomiting)    Pulmonary embolism (HCC)     Past Surgical History:  Procedure Laterality Date   CHOLECYSTECTOMY  2008   laparoscopic   IVC FILTER INSERTION N/A 04/12/2022   Procedure: IVC FILTER INSERTION;  Surgeon: Cain, Brandon Christopher, MD;  Location: [facility];  Service: Cardiovascular;  Laterality: N/A;   IVC VENOGRAPHY N/A 04/12/2022   Procedure: IVC Venography;  Surgeon: Cain, Brandon Christopher, MD;  Location: [facility]  INVASIVE CV [facility];  Service: Cardiovascular;  Laterality: N/A;   ulnar surgery Left 2017   released compressed nerve   WRIST SURGERY Right    nerve de fragmentation    Current Outpatient Medications  Medication Sig Dispense Refill Last Dose   ascorbic acid (VITAMIN C) 500 MG tablet Take 500 mg by mouth daily.      atorvastatin (LIPITOR) 40 MG tablet Take 40 mg by mouth daily.      Biotin 1000 MCG tablet Take 1,000 mcg by mouth daily.      busPIRone (BUSPAR) 10 MG tablet Take 20 mg by mouth 2 (two) times daily.      diclofenac (VOLTAREN) 75 MG EC tablet Take 75 mg by mouth 2 (two) times daily.      escitalopram (LEXAPRO) 20 MG tablet Take 20 mg by mouth daily.      SUMAtriptan (IMITREX) 100 MG tablet Take 100 mg by mouth every 2 (two) hours as needed for migraine.      vitamin B-12 (CYANOCOBALAMIN) 500 MCG tablet Take 500 mcg by mouth daily.      vitamin E 180 MG (400 UNITS) capsule Take 400 Units by mouth daily.      No current facility-administered medications for this visit.   No Known Allergies  Social History   Tobacco Use   Smoking status: Never   Smokeless tobacco: Never  Substance Use Topics   Alcohol use: Yes    Comment: occasionally    Family History  Problem Relation Age of Onset   Hypertension Mother    Hypertension Father    Diabetes Other        Review of Systems  Musculoskeletal:  Positive for arthralgias, gait problem and joint swelling.  All other systems reviewed and are negative.   Objective:  Physical Exam Constitutional:      Appearance: Normal appearance.  HENT:     Head: Normocephalic and atraumatic.     Mouth/Throat:     Mouth: Mucous membranes are moist.     Pharynx: Oropharynx is clear.  Eyes:     Conjunctiva/sclera: Conjunctivae normal.  Cardiovascular:     Rate and Rhythm: Normal rate and regular rhythm.     Pulses: Normal pulses.     Heart sounds: Normal heart sounds.  Pulmonary:     Effort: Pulmonary effort is normal.     Breath  sounds: Normal breath sounds.  Abdominal:     General: Abdomen is flat.     Palpations: Abdomen is soft.  Genitourinary:    Comments: deferred Musculoskeletal:     Cervical back: Normal range of motion and neck supple.     Comments: Examination of bilateral knees reveals no skin wounds or lesions. She is got varus deformities. She has some swelling, trace effusion. No warmth or erythema. Tenderness to palpation medial joint line, lateral joint line, peripatellar retinacular tissues with positive grind side. Crepitation with range of motion. Range of motion 3 to 115 degrees. Painless range of motion of the hips.  Distally, she is neurovascular intact.   Skin:    General: Skin is warm and dry.     Capillary Refill: Capillary refill takes less than 2 seconds.  Neurological:     General: No focal deficit present.     Mental Status: She is alert and oriented to person, place, and time.  Psychiatric:        Mood and Affect: Mood normal.        Behavior: Behavior normal.        Thought Content: Thought content normal.        Judgment: Judgment normal.     Vital signs in last 24 hours: @VSRANGES@  Labs:   Estimated body mass index is 24.28 kg/m as calculated from the following:   Height as of 04/12/22: 5' 7" (1.702 m).   Weight as of 04/12/22: 70.3 kg.   Imaging Review Plain radiographs demonstrate severe degenerative joint disease of the left knee(s). The overall alignment issignificant varus. The bone quality appears to be adequate for age and reported activity level.      Assessment/Plan:  End stage arthritis, left knee   The patient history, physical examination, clinical judgment of the provider and imaging studies are consistent with end stage degenerative joint disease of the left knee(s) and total knee arthroplasty is deemed medically necessary. The treatment options including medical management, injection therapy arthroscopy and arthroplasty were discussed at length.  The risks and benefits of total knee arthroplasty were presented and reviewed. The risks due to aseptic loosening, infection, stiffness, patella tracking problems, thromboembolic complications and other imponderables were discussed. The patient acknowledged the explanation, agreed to proceed with the plan and consent was signed. Patient is being admitted for inpatient treatment for surgery, pain control, PT, OT, prophylactic antibiotics, VTE prophylaxis, progressive ambulation and ADL's and discharge planning. The patient is planning to be discharged home with OPPT.  Therapy Plans: outpatient therapy. PT eval today. [facility] printed copy given to patient today.  Disposition: Home with Husband Planned DVT Prophylaxis: Eliquis 2.5mg BID DME needed: walker. Iceman ice machine today.    PCP: Cleared.  TXA: IV Allergies: NDKA.  Anesthesia Concerns: Easily nauseated.  BMI: 26.0 Last HgbA1c: 5.7 Other: - History of DVT and PE.  - IVC filter placement with Dr. Cain 04/12/22.  - Oxycodone, zofran, methocarbamol, continue diclofenac.  - 04/09/22: Hgb 13.4, Cr. 0.82, K+ 3.7   Patient's anticipated LOS is less than 2 midnights, meeting these requirements: - Younger than 65 - Lives within 1 hour of care - Has a competent adult at home to recover with post-op recover - NO history of  - Chronic pain requiring opiods  - Diabetes  - Coronary Artery Disease  - Heart failure  - Heart attack  - Stroke  - DVT/VTE  - Cardiac arrhythmia  - Respiratory Failure/COPD  - Renal failure  - Anemia  - Advanced Liver disease   

## 2022-04-15 NOTE — H&P (Signed)
TOTAL KNEE ADMISSION H&P  Patient is being admitted for left total knee arthroplasty.  Subjective:  Chief Complaint:left knee pain.  HPI: Lori Klein, 57 y.o. female, has a history of pain and functional disability in the left knee due to arthritis and has failed non-surgical conservative treatments for greater than 12 weeks to includeNSAID's and/or analgesics, corticosteriod injections, flexibility and strengthening excercises, use of assistive devices, and activity modification.  Onset of symptoms was gradual, starting 10 years ago with rapidlly worsening course since that time. The patient noted no past surgery on the left knee(s).  Patient currently rates pain in the left knee(s) at 10 out of 10 with activity. Patient has night pain, worsening of pain with activity and weight bearing, pain that interferes with activities of daily living, pain with passive range of motion, crepitus, and joint swelling.  Patient has evidence of subchondral cysts, subchondral sclerosis, periarticular osteophytes, and joint space narrowing by imaging studies. There is no active infection.  Patient Active Problem List   Diagnosis Date Noted   Bilateral pulmonary embolism ([place]) 09/27/2015   Acute deep vein thrombosis (DVT) of right lower extremity ([place]) 09/27/2015   Past Medical History:  Diagnosis Date   Anxiety    Arthritis    Blood dyscrasia    has sickle cell trait   DVT (deep venous thrombosis) (HCC)    Headache    migraine   PONV (postoperative nausea and vomiting)    Pulmonary embolism [facility])     Past Surgical History:  Procedure Laterality Date   CHOLECYSTECTOMY  2008   laparoscopic   IVC FILTER INSERTION N/A 04/12/2022   Procedure: IVC FILTER INSERTION;  Surgeon: Waynetta Sandy, MD;  Location: [place] CV [facility];  Service: Cardiovascular;  Laterality: N/A;   IVC VENOGRAPHY N/A 04/12/2022   Procedure: IVC Venography;  Surgeon: Waynetta Sandy, MD;  Location: [place] CV [facility];  Service: Cardiovascular;  Laterality: N/A;   ulnar surgery Left 2017   released compressed nerve   WRIST SURGERY Right    nerve de fragmentation    Current Outpatient Medications  Medication Sig Dispense Refill Last Dose   ascorbic acid (VITAMIN C) 500 MG tablet Take 500 mg by mouth daily.      atorvastatin (LIPITOR) 40 MG tablet Take 40 mg by mouth daily.      Biotin 1000 MCG tablet Take 1,000 mcg by mouth daily.      busPIRone (BUSPAR) 10 MG tablet Take 20 mg by mouth 2 (two) times daily.      diclofenac (VOLTAREN) 75 MG EC tablet Take 75 mg by mouth 2 (two) times daily.      escitalopram (LEXAPRO) 20 MG tablet Take 20 mg by mouth daily.      SUMAtriptan (IMITREX) 100 MG tablet Take 100 mg by mouth every 2 (two) hours as needed for migraine.      vitamin B-12 (CYANOCOBALAMIN) 500 MCG tablet Take 500 mcg by mouth daily.      vitamin E 180 MG (400 UNITS) capsule Take 400 Units by mouth daily.      No current facility-administered medications for this visit.   No Known Allergies  Social History   Tobacco Use   Smoking status: Never   Smokeless tobacco: Never  Substance Use Topics   Alcohol use: Yes    Comment: occasionally    Family History  Problem Relation Age of Onset   Hypertension Mother    Hypertension Father    Diabetes Other  Review of Systems  Musculoskeletal:  Positive for arthralgias, gait problem and joint swelling.  All other systems reviewed and are negative.   Objective:  Physical Exam Constitutional:      Appearance: Normal appearance.  HENT:     Head: Normocephalic and atraumatic.     Mouth/Throat:     Mouth: Mucous membranes are moist.     Pharynx: Oropharynx is clear.  Eyes:     Conjunctiva/sclera: Conjunctivae normal.  Cardiovascular:     Rate and Rhythm: Normal rate and regular rhythm.     Pulses: Normal pulses.     Heart sounds: Normal heart sounds.  Pulmonary:     Effort: Pulmonary effort is normal.     Breath  sounds: Normal breath sounds.  Abdominal:     General: Abdomen is flat.     Palpations: Abdomen is soft.  Genitourinary:    Comments: deferred Musculoskeletal:     Cervical back: Normal range of motion and neck supple.     Comments: Examination of bilateral knees reveals no skin wounds or lesions. She is got varus deformities. She has some swelling, trace effusion. No warmth or erythema. Tenderness to palpation medial joint line, lateral joint line, peripatellar retinacular tissues with positive grind side. Crepitation with range of motion. Range of motion 3 to 115 degrees. Painless range of motion of the hips.  Distally, she is neurovascular intact.   Skin:    General: Skin is warm and dry.     Capillary Refill: Capillary refill takes less than 2 seconds.  Neurological:     General: No focal deficit present.     Mental Status: She is alert and oriented to person, place, and time.  Psychiatric:        Mood and Affect: Mood normal.        Behavior: Behavior normal.        Thought Content: Thought content normal.        Judgment: Judgment normal.     Vital signs in last 24 hours: '@VSRANGES'$ @  Labs:   Estimated body mass index is 24.28 kg/m as calculated from the following:   Height as of 04/12/22: '5\' 7"'$  (1.702 m).   Weight as of 04/12/22: 70.3 kg.   Imaging Review Plain radiographs demonstrate severe degenerative joint disease of the left knee(s). The overall alignment issignificant varus. The bone quality appears to be adequate for age and reported activity level.      Assessment/Plan:  End stage arthritis, left knee   The patient history, physical examination, clinical judgment of the provider and imaging studies are consistent with end stage degenerative joint disease of the left knee(s) and total knee arthroplasty is deemed medically necessary. The treatment options including medical management, injection therapy arthroscopy and arthroplasty were discussed at length.  The risks and benefits of total knee arthroplasty were presented and reviewed. The risks due to aseptic loosening, infection, stiffness, patella tracking problems, thromboembolic complications and other imponderables were discussed. The patient acknowledged the explanation, agreed to proceed with the plan and consent was signed. Patient is being admitted for inpatient treatment for surgery, pain control, PT, OT, prophylactic antibiotics, VTE prophylaxis, progressive ambulation and ADL's and discharge planning. The patient is planning to be discharged home with OPPT.  Therapy Plans: outpatient therapy. PT eval today. [facility] printed copy given to patient today.  Disposition: Home with Husband Planned DVT Prophylaxis: Eliquis 2.'5mg'$  BID DME needed: walker. Iceman ice machine today.  PCP: Cleared.  TXA: IV Allergies: NDKA.  Anesthesia Concerns: Easily nauseated.  BMI: 26.0 Last HgbA1c: 5.7 Other: - History of DVT and PE.  - IVC filter placement with Dr. Donzetta Matters 04/12/22.  - Oxycodone, zofran, methocarbamol, continue diclofenac.  - 04/09/22: Hgb 13.4, Cr. 0.82, K+ 3.7   Patient's anticipated LOS is less than 2 midnights, meeting these requirements: - Younger than 83 - Lives within 1 hour of care - Has a competent adult at home to recover with post-op recover - NO history of  - Chronic pain requiring opiods  - Diabetes  - Coronary Artery Disease  - Heart failure  - Heart attack  - Stroke  - DVT/VTE  - Cardiac arrhythmia  - Respiratory Failure/COPD  - Renal failure  - Anemia  - Advanced Liver disease

## 2022-04-21 ENCOUNTER — Ambulatory Visit (HOSPITAL_COMMUNITY): Payer: Federal, State, Local not specified - PPO | Admitting: Certified Registered"

## 2022-04-21 ENCOUNTER — Encounter (HOSPITAL_COMMUNITY): Payer: Self-pay | Admitting: Orthopedic Surgery

## 2022-04-21 ENCOUNTER — Encounter (HOSPITAL_COMMUNITY)
Admission: RE | Disposition: A | Discharge status: Home / Self Care | Payer: Self-pay | Source: Home / Self Care | Attending: Orthopedic Surgery

## 2022-04-21 ENCOUNTER — Ambulatory Visit (HOSPITAL_COMMUNITY): Payer: Federal, State, Local not specified - PPO

## 2022-04-21 ENCOUNTER — Other Ambulatory Visit (HOSPITAL_COMMUNITY): Payer: Self-pay

## 2022-04-21 ENCOUNTER — Other Ambulatory Visit: Payer: Self-pay

## 2022-04-21 ENCOUNTER — Ambulatory Visit (HOSPITAL_COMMUNITY)
Admission: RE | Admit: 2022-04-21 | Discharge: 2022-04-21 | Disposition: A | Discharge status: Home / Self Care | Payer: Federal, State, Local not specified - PPO | Attending: Orthopedic Surgery | Admitting: Orthopedic Surgery

## 2022-04-21 DIAGNOSIS — R7303 Prediabetes: Secondary | ICD-10-CM

## 2022-04-21 DIAGNOSIS — M1712 Unilateral primary osteoarthritis, left knee: Secondary | ICD-10-CM | POA: Insufficient documentation

## 2022-04-21 HISTORY — PX: KNEE ARTHROPLASTY: SHX992

## 2022-04-21 SURGERY — ARTHROPLASTY, KNEE, TOTAL, USING IMAGELESS COMPUTER-ASSISTED NAVIGATION
Anesthesia: Monitor Anesthesia Care | Site: Knee | Laterality: Left

## 2022-04-21 MED ORDER — METHOCARBAMOL 500 MG PO TABS
500.0000 mg | ORAL_TABLET | Freq: Four times a day (QID) | ORAL | 0 refills | Status: DC | PRN
  Filled 2022-04-21: qty 30, 8d supply, fill #0

## 2022-04-21 MED ORDER — ACETAMINOPHEN 500 MG PO TABS
1000.0000 mg | ORAL_TABLET | Freq: Once | ORAL | Status: AC
  Administered 2022-04-21: 1000 mg via ORAL
  Filled 2022-04-21: qty 2

## 2022-04-21 MED ORDER — DEXAMETHASONE SODIUM PHOSPHATE 10 MG/ML IJ SOLN
INTRAMUSCULAR | Status: AC
  Filled 2022-04-21: qty 1

## 2022-04-21 MED ORDER — GLYCOPYRROLATE 0.2 MG/ML IJ SOLN
INTRAMUSCULAR | Status: AC
  Filled 2022-04-21: qty 1

## 2022-04-21 MED ORDER — ISOPROPYL ALCOHOL 70 % SOLN
Status: DC | PRN
  Administered 2022-04-21: 1 via TOPICAL

## 2022-04-21 MED ORDER — DEXAMETHASONE SODIUM PHOSPHATE 4 MG/ML IJ SOLN
INTRAMUSCULAR | Status: DC | PRN
  Administered 2022-04-21: 10 mg via INTRAVENOUS

## 2022-04-21 MED ORDER — ONDANSETRON HCL 4 MG/2ML IJ SOLN
INTRAMUSCULAR | Status: AC
  Filled 2022-04-21: qty 2

## 2022-04-21 MED ORDER — EPHEDRINE SULFATE (PRESSORS) 50 MG/ML IJ SOLN
INTRAMUSCULAR | Status: DC | PRN
  Administered 2022-04-21: 15 mg via INTRAVENOUS
  Administered 2022-04-21 (×2): 10 mg via INTRAVENOUS
  Administered 2022-04-21: 15 mg via INTRAVENOUS

## 2022-04-21 MED ORDER — METHOCARBAMOL 500 MG PO TABS: 500.0000 mg | ORAL_TABLET | Freq: Four times a day (QID) | ORAL | Status: DC | PRN

## 2022-04-21 MED ORDER — PHENYLEPHRINE 80 MCG/ML (10ML) SYRINGE FOR IV PUSH (FOR BLOOD PRESSURE SUPPORT)
PREFILLED_SYRINGE | INTRAVENOUS | Status: AC
  Filled 2022-04-21: qty 10

## 2022-04-21 MED ORDER — DOCUSATE SODIUM 100 MG PO CAPS
100.0000 mg | ORAL_CAPSULE | Freq: Two times a day (BID) | ORAL | 1 refills | Status: AC
  Filled 2022-04-21: qty 100, 50d supply, fill #0

## 2022-04-21 MED ORDER — LACTATED RINGERS IV SOLN: INTRAVENOUS | Status: DC | PRN

## 2022-04-21 MED ORDER — SODIUM CHLORIDE 0.9 % IR SOLN
Status: DC | PRN
  Administered 2022-04-21: 3000 mL

## 2022-04-21 MED ORDER — FENTANYL CITRATE PF 50 MCG/ML IJ SOSY
PREFILLED_SYRINGE | INTRAMUSCULAR | Status: AC
  Filled 2022-04-21: qty 2

## 2022-04-21 MED ORDER — LACTATED RINGERS IV SOLN: INTRAVENOUS | Status: DC

## 2022-04-21 MED ORDER — ACETAMINOPHEN 500 MG PO TABS
ORAL_TABLET | ORAL | Status: AC
  Filled 2022-04-21: qty 1

## 2022-04-21 MED ORDER — ALBUMIN HUMAN 5 % IV SOLN
INTRAVENOUS | Status: AC
  Filled 2022-04-21: qty 250

## 2022-04-21 MED ORDER — LACTATED RINGERS IV SOLN
INTRAVENOUS | Status: DC
  Administered 2022-04-21: 1000 mL via INTRAVENOUS

## 2022-04-21 MED ORDER — POLYETHYLENE GLYCOL 3350 17 G PO PACK
17.0000 g | PACK | Freq: Every day | ORAL | 0 refills | Status: DC | PRN
  Filled 2022-04-21: qty 14, 14d supply, fill #0

## 2022-04-21 MED ORDER — LIDOCAINE HCL (PF) 2 % IJ SOLN
INTRAMUSCULAR | Status: AC
  Filled 2022-04-21: qty 5

## 2022-04-21 MED ORDER — ROPIVACAINE HCL 5 MG/ML IJ SOLN
INTRAMUSCULAR | Status: DC | PRN
  Administered 2022-04-21: 20 mL via PERINEURAL

## 2022-04-21 MED ORDER — POVIDONE-IODINE 10 % EX SWAB
2.0000 | Freq: Once | CUTANEOUS | Status: AC
  Administered 2022-04-21: 2 via TOPICAL

## 2022-04-21 MED ORDER — PROPOFOL 500 MG/50ML IV EMUL
INTRAVENOUS | Status: DC | PRN
  Administered 2022-04-21: 30 mg via INTRAVENOUS
  Administered 2022-04-21: 75 ug/kg/min via INTRAVENOUS
  Administered 2022-04-21: 30 mg via INTRAVENOUS

## 2022-04-21 MED ORDER — MIDAZOLAM HCL 2 MG/2ML IJ SOLN
INTRAMUSCULAR | Status: AC
  Filled 2022-04-21: qty 2

## 2022-04-21 MED ORDER — FENTANYL CITRATE (PF) 100 MCG/2ML IJ SOLN
INTRAMUSCULAR | Status: DC | PRN
  Administered 2022-04-21 (×2): 25 ug via INTRAVENOUS
  Administered 2022-04-21: 50 ug via INTRAVENOUS

## 2022-04-21 MED ORDER — LACTATED RINGERS IV BOLUS: 250.0000 mL | Freq: Once | INTRAVENOUS | Status: DC

## 2022-04-21 MED ORDER — CEFAZOLIN SODIUM-DEXTROSE 2-4 GM/100ML-% IV SOLN: 2.0000 g | Freq: Four times a day (QID) | INTRAVENOUS | Status: DC

## 2022-04-21 MED ORDER — PROPOFOL 1000 MG/100ML IV EMUL
INTRAVENOUS | Status: AC
  Filled 2022-04-21: qty 200

## 2022-04-21 MED ORDER — SODIUM CHLORIDE (PF) 0.9 % IJ SOLN
INTRAMUSCULAR | Status: DC | PRN
  Administered 2022-04-21: 30 mL

## 2022-04-21 MED ORDER — LACTATED RINGERS IV BOLUS
500.0000 mL | Freq: Once | INTRAVENOUS | Status: AC
  Administered 2022-04-21: 500 mL via INTRAVENOUS

## 2022-04-21 MED ORDER — APIXABAN 2.5 MG PO TABS
2.5000 mg | ORAL_TABLET | Freq: Two times a day (BID) | ORAL | 0 refills | Status: DC
  Filled 2022-04-21: qty 60, 30d supply, fill #0

## 2022-04-21 MED ORDER — STERILE WATER FOR IRRIGATION IR SOLN
Status: DC | PRN
  Administered 2022-04-21: 2000 mL

## 2022-04-21 MED ORDER — FENTANYL CITRATE (PF) 100 MCG/2ML IJ SOLN
INTRAMUSCULAR | Status: AC
  Filled 2022-04-21: qty 2

## 2022-04-21 MED ORDER — CEFAZOLIN SODIUM-DEXTROSE 2-4 GM/100ML-% IV SOLN
2.0000 g | INTRAVENOUS | Status: AC
  Administered 2022-04-21: 2 g via INTRAVENOUS
  Filled 2022-04-21: qty 100

## 2022-04-21 MED ORDER — EPHEDRINE 5 MG/ML INJ
INTRAVENOUS | Status: AC
  Filled 2022-04-21: qty 5

## 2022-04-21 MED ORDER — OXYCODONE HCL 5 MG PO TABS
ORAL_TABLET | ORAL | Status: AC
  Filled 2022-04-21: qty 1

## 2022-04-21 MED ORDER — ACETAMINOPHEN 500 MG PO TABS
1000.0000 mg | ORAL_TABLET | Freq: Three times a day (TID) | ORAL | 0 refills | Status: AC
  Filled 2022-04-21: qty 180, 30d supply, fill #0
  Filled 2022-05-19: qty 100, 17d supply, fill #0

## 2022-04-21 MED ORDER — MIDAZOLAM HCL 2 MG/2ML IJ SOLN
2.0000 mg | Freq: Once | INTRAMUSCULAR | Status: AC
  Administered 2022-04-21: 2 mg via INTRAVENOUS

## 2022-04-21 MED ORDER — KETOROLAC TROMETHAMINE 30 MG/ML IJ SOLN
INTRAMUSCULAR | Status: DC | PRN
  Administered 2022-04-21: 30 mg

## 2022-04-21 MED ORDER — TRANEXAMIC ACID-NACL 1000-0.7 MG/100ML-% IV SOLN
1000.0000 mg | INTRAVENOUS | Status: DC
  Filled 2022-04-21: qty 100

## 2022-04-21 MED ORDER — ONDANSETRON HCL 4 MG PO TABS
4.0000 mg | ORAL_TABLET | Freq: Three times a day (TID) | ORAL | 0 refills | Status: DC | PRN
  Filled 2022-04-21: qty 20, 7d supply, fill #0

## 2022-04-21 MED ORDER — OXYCODONE HCL 5 MG/5ML PO SOLN: 5.0000 mg | Freq: Once | ORAL | Status: AC | PRN

## 2022-04-21 MED ORDER — BUPIVACAINE IN DEXTROSE 0.75-8.25 % IT SOLN
INTRATHECAL | Status: DC | PRN
  Administered 2022-04-21: 2 mL via INTRATHECAL

## 2022-04-21 MED ORDER — SODIUM CHLORIDE 0.9% IV SOLUTION
INTRAVENOUS | Status: DC | PRN
  Administered 2022-04-21: 1000 mL

## 2022-04-21 MED ORDER — FENTANYL CITRATE PF 50 MCG/ML IJ SOSY
100.0000 ug | PREFILLED_SYRINGE | Freq: Once | INTRAMUSCULAR | Status: AC
  Administered 2022-04-21: 100 ug via INTRAVENOUS

## 2022-04-21 MED ORDER — CHLORHEXIDINE GLUCONATE 0.12 % MT SOLN
15.0000 mL | Freq: Once | OROMUCOSAL | Status: AC
  Administered 2022-04-21: 15 mL via OROMUCOSAL

## 2022-04-21 MED ORDER — ALBUMIN HUMAN 5 % IV SOLN: INTRAVENOUS | Status: DC | PRN

## 2022-04-21 MED ORDER — PROMETHAZINE HCL 25 MG/ML IJ SOLN: 6.2500 mg | INTRAMUSCULAR | Status: DC | PRN

## 2022-04-21 MED ORDER — PROPOFOL 1000 MG/100ML IV EMUL
INTRAVENOUS | Status: AC
  Filled 2022-04-21: qty 100

## 2022-04-21 MED ORDER — BUPIVACAINE-EPINEPHRINE (PF) 0.25% -1:200000 IJ SOLN
INTRAMUSCULAR | Status: AC
  Filled 2022-04-21: qty 30

## 2022-04-21 MED ORDER — PHENYLEPHRINE HCL-NACL 20-0.9 MG/250ML-% IV SOLN
INTRAVENOUS | Status: DC | PRN
  Administered 2022-04-21: 25 ug/min via INTRAVENOUS

## 2022-04-21 MED ORDER — OXYCODONE HCL 5 MG PO TABS
5.0000 mg | ORAL_TABLET | Freq: Once | ORAL | Status: AC | PRN
  Administered 2022-04-21: 5 mg via ORAL

## 2022-04-21 MED ORDER — KETOROLAC TROMETHAMINE 30 MG/ML IJ SOLN
INTRAMUSCULAR | Status: AC
  Filled 2022-04-21: qty 1

## 2022-04-21 MED ORDER — OXYCODONE HCL 5 MG PO TABS
5.0000 mg | ORAL_TABLET | ORAL | 0 refills | Status: AC | PRN
  Filled 2022-04-21: qty 28, 7d supply, fill #0

## 2022-04-21 MED ORDER — HYDROMORPHONE HCL 1 MG/ML IJ SOLN: 0.2500 mg | INTRAMUSCULAR | Status: DC | PRN

## 2022-04-21 MED ORDER — SENNA 8.6 MG PO TABS: 2.0000 | ORAL_TABLET | Freq: Every day | ORAL | 1 refills | Status: AC

## 2022-04-21 MED ORDER — ORAL CARE MOUTH RINSE: 15.0000 mL | Freq: Once | OROMUCOSAL | Status: AC

## 2022-04-21 MED ORDER — SODIUM CHLORIDE (PF) 0.9 % IJ SOLN
INTRAMUSCULAR | Status: AC
  Filled 2022-04-21: qty 30

## 2022-04-21 MED ORDER — PHENYLEPHRINE HCL-NACL 20-0.9 MG/250ML-% IV SOLN
INTRAVENOUS | Status: AC
  Filled 2022-04-21: qty 250

## 2022-04-21 MED ORDER — BUPIVACAINE-EPINEPHRINE 0.25% -1:200000 IJ SOLN
INTRAMUSCULAR | Status: DC | PRN
  Administered 2022-04-21: 30 mL

## 2022-04-21 MED ORDER — ONDANSETRON HCL 4 MG/2ML IJ SOLN
INTRAMUSCULAR | Status: DC | PRN
  Administered 2022-04-21: 4 mg via INTRAVENOUS

## 2022-04-21 MED ORDER — METHOCARBAMOL 500 MG IVPB - SIMPLE MED: 500.0000 mg | Freq: Four times a day (QID) | INTRAVENOUS | Status: DC | PRN

## 2022-04-21 SURGICAL SUPPLY — 71 items
ADH SKN CLS APL DERMABOND .7 (GAUZE/BANDAGES/DRESSINGS) ×2
APL PRP STRL LF DISP 70% ISPRP (MISCELLANEOUS) ×2
BAG COUNTER SPONGE SURGICOUNT (BAG) IMPLANT
BAG SPEC THK2 15X12 ZIP CLS (MISCELLANEOUS)
BAG SPNG CNTER NS LX DISP (BAG)
BAG ZIPLOCK 12X15 (MISCELLANEOUS) IMPLANT
BATTERY INSTRU NAVIGATION (MISCELLANEOUS) ×3 IMPLANT
BLADE SAW RECIPROCATING 77.5 (BLADE) ×1 IMPLANT
BNDG CMPR 5X62 HK CLSR LF (GAUZE/BANDAGES/DRESSINGS) ×1
BNDG CMPR MED 15X6 ELC VLCR LF (GAUZE/BANDAGES/DRESSINGS) ×1
BNDG ELASTIC 4X5.8 VLCR STR LF (GAUZE/BANDAGES/DRESSINGS) ×2 IMPLANT
BNDG ELASTIC 6INX 5YD STR LF (GAUZE/BANDAGES/DRESSINGS) ×1 IMPLANT
BNDG ELASTIC 6X15 VLCR STRL LF (GAUZE/BANDAGES/DRESSINGS) IMPLANT
BTRY SRG DRVR LF (MISCELLANEOUS) ×3
CHLORAPREP W/TINT 26 (MISCELLANEOUS) ×2 IMPLANT
COMP FEM PS KNEE NRW 7 LT (Joint) ×1 IMPLANT
COMPONENT FEM PS KNEE NRW 7 LT (Joint) IMPLANT
COVER SURGICAL LIGHT HANDLE (MISCELLANEOUS) ×1 IMPLANT
DERMABOND ADVANCED .7 DNX12 (GAUZE/BANDAGES/DRESSINGS) ×2 IMPLANT
DRAPE SHEET LG 3/4 BI-LAMINATE (DRAPES) ×3 IMPLANT
DRAPE U-SHAPE 47X51 STRL (DRAPES) ×1 IMPLANT
DRSG AQUACEL AG ADV 3.5X10 (GAUZE/BANDAGES/DRESSINGS) ×1 IMPLANT
ELECT BLADE TIP CTD 4 INCH (ELECTRODE) ×1 IMPLANT
ELECT REM PT RETURN 15FT ADLT (MISCELLANEOUS) ×1 IMPLANT
GAUZE SPONGE 4X4 12PLY STRL (GAUZE/BANDAGES/DRESSINGS) ×1 IMPLANT
GLOVE BIO SURGEON STRL SZ7 (GLOVE) ×1 IMPLANT
GLOVE BIO SURGEON STRL SZ8.5 (GLOVE) ×2 IMPLANT
GLOVE BIOGEL PI IND STRL 7.5 (GLOVE) ×1 IMPLANT
GLOVE BIOGEL PI IND STRL 8.5 (GLOVE) ×2 IMPLANT
GOWN SPEC L3 XXLG W/TWL (GOWN DISPOSABLE) ×1 IMPLANT
GOWN STRL REUS W/ TWL XL LVL3 (GOWN DISPOSABLE) ×2 IMPLANT
GOWN STRL REUS W/TWL XL LVL3 (GOWN DISPOSABLE) ×1
HANDPIECE INTERPULSE COAX TIP (DISPOSABLE) ×1
HDLS TROCR DRIL PIN KNEE 75 (PIN) ×1
HOLDER FOLEY CATH W/STRAP (MISCELLANEOUS) ×2 IMPLANT
HOOD PEEL AWAY T7 (MISCELLANEOUS) ×3 IMPLANT
IMPL PATELLA METAL SZ32X10 (Joint) IMPLANT
KIT TURNOVER KIT A (KITS) IMPLANT
LINER TIB PS CD/3-9 10 LT (Liner) IMPLANT
MARKER SKIN DUAL TIP RULER LAB (MISCELLANEOUS) ×2 IMPLANT
NDL SAFETY ECLIP 18X1.5 (MISCELLANEOUS) ×1 IMPLANT
NDL SPNL 18GX3.5 QUINCKE PK (NEEDLE) ×1 IMPLANT
NEEDLE SPNL 18GX3.5 QUINCKE PK (NEEDLE) ×1 IMPLANT
NS IRRIG 1000ML POUR BTL (IV SOLUTION) ×1 IMPLANT
PACK TOTAL KNEE CUSTOM (KITS) ×1 IMPLANT
PADDING CAST COTTON 6X4 STRL (CAST SUPPLIES) ×1 IMPLANT
PADDING CAST SYNTHETIC 6X4 NS (CAST SUPPLIES) IMPLANT
PIN DRILL HDLS TROCAR 75 4PK (PIN) IMPLANT
PROTECTOR NERVE ULNAR (MISCELLANEOUS) ×1 IMPLANT
SAW OSC TIP CART 19.5X105X1.3 (SAW) ×1 IMPLANT
SCREW FEMALE HEX FIX 25X2.5 (ORTHOPEDIC DISPOSABLE SUPPLIES) IMPLANT
SEALER BIPOLAR AQUA 6.0 (INSTRUMENTS) ×1 IMPLANT
SET HNDPC FAN SPRY TIP SCT (DISPOSABLE) ×1 IMPLANT
SET PAD KNEE POSITIONER (MISCELLANEOUS) ×1 IMPLANT
SOLUTION PRONTOSAN WOUND 350ML (IRRIGATION / IRRIGATOR) IMPLANT
SPIKE FLUID TRANSFER (MISCELLANEOUS) ×2 IMPLANT
STEM TIB PS KNEE D 0D LT (Stem) IMPLANT
SUT MNCRL AB 3-0 PS2 18 (SUTURE) ×2 IMPLANT
SUT MNCRL AB 4-0 PS2 18 (SUTURE) IMPLANT
SUT MON AB 2-0 CT1 36 (SUTURE) ×1 IMPLANT
SUT STRATAFIX PDO 1 14 VIOLET (SUTURE) ×1
SUT STRATFX PDO 1 14 VIOLET (SUTURE) ×1
SUT VIC AB 1 CTX 36 (SUTURE) ×2
SUT VIC AB 1 CTX36XBRD ANBCTR (SUTURE) ×2 IMPLANT
SUT VIC AB 2-0 CT1 27 (SUTURE) ×1
SUT VIC AB 2-0 CT1 TAPERPNT 27 (SUTURE) ×1 IMPLANT
SUTURE STRATFX PDO 1 14 VIOLET (SUTURE) ×1 IMPLANT
SYR 3ML LL SCALE MARK (SYRINGE) ×1 IMPLANT
TRAY FOLEY MTR SLVR 16FR STAT (SET/KITS/TRAYS/PACK) IMPLANT
TUBE SUCTION HIGH CAP CLEAR NV (SUCTIONS) ×1 IMPLANT
WATER STERILE IRR 1000ML POUR (IV SOLUTION) ×2 IMPLANT

## 2022-04-21 NOTE — Anesthesia Preprocedure Evaluation (Signed)
Anesthesia Evaluation  Patient identified by MRN, date of birth, ID band Patient awake    Reviewed: Allergy & Precautions, H&P , NPO status , Patient's Chart, lab work & pertinent test results  History of Anesthesia Complications (+) PONV and history of anesthetic complications  Airway Mallampati: II  TM Distance: >3 FB Neck ROM: Full    Dental no notable dental hx.    Pulmonary neg pulmonary ROS   Pulmonary exam normal breath sounds clear to auscultation       Cardiovascular negative cardio ROS Normal cardiovascular exam Rhythm:Regular Rate:Normal     Neuro/Psych  Headaches  Anxiety      negative psych ROS   GI/Hepatic negative GI ROS, Neg liver ROS,,,  Endo/Other  negative endocrine ROS    Renal/GU negative Renal ROS  negative genitourinary   Musculoskeletal  (+) Arthritis , Osteoarthritis,    Abdominal   Peds negative pediatric ROS (+)  Hematology negative hematology ROS (+)   Anesthesia Other Findings   Reproductive/Obstetrics negative OB ROS                             Anesthesia Physical Anesthesia Plan  ASA: 2  Anesthesia Plan: MAC, Regional and Spinal   Post-op Pain Management: Regional block* and Tylenol PO (pre-op)*   Induction: Intravenous  PONV Risk Score and Plan: 3 and Ondansetron, Dexamethasone, Midazolam and Treatment may vary due to age or medical condition  Airway Management Planned: Simple Face Mask  Additional Equipment:   Intra-op Plan:   Post-operative Plan:   Informed Consent: I have reviewed the patients History and Physical, chart, labs and discussed the procedure including the risks, benefits and alternatives for the proposed anesthesia with the patient or authorized representative who has indicated his/her understanding and acceptance.     Dental advisory given  Plan Discussed with: CRNA  Anesthesia Plan Comments:        Anesthesia  Quick Evaluation

## 2022-04-21 NOTE — Anesthesia Procedure Notes (Signed)
Spinal  Patient location during procedure: OR Start time: 04/21/2022 11:20 AM Reason for block: surgical anesthesia Staffing Performed: resident/CRNA  Resident/CRNA: Timoteo Expose, CRNA Performed by: Timoteo Expose, CRNA Authorized by: Timoteo Expose, CRNA   Preanesthetic Checklist Completed: patient identified, IV checked, site marked, risks and benefits discussed, surgical consent, monitors and equipment checked, pre-op evaluation and timeout performed Spinal Block Patient position: sitting Prep: DuraPrep Patient monitoring: heart rate, cardiac monitor, continuous pulse ox and blood pressure Approach: midline Location: L3-4 Injection technique: single-shot Needle Needle type: Pencan  Needle gauge: 24 G Needle length: 10 cm Assessment Sensory level: T10 Events: CSF return

## 2022-04-21 NOTE — Interval H&P Note (Signed)
History and Physical Interval Note:  04/21/2022 7:35 AM  Lori Klein  has presented today for surgery, with the diagnosis of Left knee osteoarthritis.  The various methods of treatment have been discussed with the patient and family. After consideration of risks, benefits and other options for treatment, the patient has consented to  Procedure(s): COMPUTER ASSISTED TOTAL KNEE ARTHROPLASTY (Left) as a surgical intervention.  The patient's history has been reviewed, patient examined, no change in status, stable for surgery.  I have reviewed the patient's chart and labs.  Questions were answered to the patient's satisfaction.     Hilton Cork 

## 2022-04-21 NOTE — Op Note (Signed)
OPERATIVE REPORT  SURGEON: Rod Can, MD   ASSISTANT: Larene Pickett, PA-C  PREOPERATIVE DIAGNOSIS: Primary Left knee arthritis.   POSTOPERATIVE DIAGNOSIS: Primary Left knee arthritis.   PROCEDURE: Computer assisted Left total knee arthroplasty.   IMPLANTS: Zimmer Persona PPS Cementless CR femur, size 7 Narrow. Persona 0 degree Spiked Keel OsseoTi Tibia, size D. Vivacit-E polyethelyene insert, size 10 mm, CR. TM standard patella, size 32 mm.  ANESTHESIA:  MAC, Regional, and Spinal  TOURNIQUET TIME: Not utilized.   ESTIMATED BLOOD LOSS:-150 mL    ANTIBIOTICS: 2 g Ancef.  DRAINS: None.  COMPLICATIONS: None   CONDITION: PACU - hemodynamically stable.   BRIEF CLINICAL NOTE: Lori Klein is a 57 y.o. female with a long-standing history of Left knee arthritis. After failing conservative management, the patient was indicated for total knee arthroplasty. The risks, benefits, and alternatives to the procedure were explained, and the patient elected to proceed.  PROCEDURE IN DETAIL: Adductor canal block was obtained in the pre-op holding area. Once inside the operative room, spinal anesthesia was obtained, and a foley catheter was inserted. The patient was then positioned and the lower extremity was prepped and draped in the normal sterile surgical fashion.  A time-out was called verifying side and site of surgery. The patient received IV antibiotics within 60 minutes of beginning the procedure. A tourniquet was not utilized.   An anterior approach to the knee was performed utilizing a midvastus arthrotomy. A medial release was performed and the patellar fat pad was excised. Stryker imageless navigation was used to cut the distal femur perpendicular to the mechanical axis. A freehand patellar resection was performed, and the patella was sized an prepared with a lug hole.  Nagivation was used to make a neutral proximal tibia resection, taking 9 mm of bone from the less affected  lateral side with 3 degrees of slope. The menisci were excised. A spacer block was placed, and the alignment and balance in extension were confirmed.   The distal femur was sized using the 3-degree external rotation guide referencing the posterior femoral cortex. The appropriate 4-in-1 cutting block was pinned into place. Rotation was checked using Whiteside's line, the epicondylar axis, and then confirmed with a spacer block in flexion. The remaining femoral cuts were performed, taking care to protect the MCL.  The tibia was sized and the trial tray was pinned into place. The remaining trail components were inserted. The knee was stable to varus and valgus stress through a full range of motion. The patella tracked centrally, and the PCL was well balanced. The trial components were removed, and the proximal tibial surface was prepared. Final components were impacted into place. The knee was tested for a final time and found to be well balanced.   The wound was copiously irrigated with Prontosan solution and normal saline using pulse lavage.  Marcaine solution was injected into the periarticular soft tissue.  The wound was closed in layers using #1 Vicryl and Stratafix for the fascia, 2-0 Vicryl for the subcutaneous fat, 2-0 Monocryl for the deep dermal layer, 3-0 running Monocryl subcuticular Stitch, and 4-0 Monocryl stay sutures at both ends of the wound. Dermabond was applied to the skin.  Once the glue was fully dried, an Aquacell Ag and compressive dressing were applied.  The patient was transported to the recovery room in stable condition.  Sponge, needle, and instrument counts were correct at the end of the case x2.  The patient tolerated the procedure well and there were no  known complications.  Please note that a surgical assistant was a medical necessity for this procedure in order to perform it in a safe and expeditious manner. Surgical assistant was necessary to retract the ligaments and vital  neurovascular structures to prevent injury to them and also necessary for proper positioning of the limb to allow for anatomic placement of the prosthesis.

## 2022-04-21 NOTE — Transfer of Care (Signed)
Immediate Anesthesia Transfer of Care Note  Patient: Lori Klein  Procedure(s) Performed: COMPUTER ASSISTED TOTAL KNEE ARTHROPLASTY (Left: Knee)  Patient Location: PACU  Anesthesia Type:MAC  Level of Consciousness: awake and alert   Airway & Oxygen Therapy: Patient Spontanous Breathing and Patient connected to nasal cannula oxygen  Post-op Assessment: Report given to RN and Post -op Vital signs reviewed and stable  Post vital signs: Reviewed and stable  Last Vitals:  Vitals Value Taken Time  BP 98/48 04/21/22 1304  Temp    Pulse 73 04/21/22 1307  Resp 12 04/21/22 1307  SpO2 99 % 04/21/22 1307  Vitals shown include unvalidated device data.  Last Pain:  Vitals:   04/21/22 0955  TempSrc:   PainSc: 0-No pain         Complications: No notable events documented.

## 2022-04-21 NOTE — Anesthesia Procedure Notes (Signed)
Anesthesia Regional Block: Adductor canal block   Pre-Anesthetic Checklist: , timeout performed,  Correct Patient, Correct Site, Correct Laterality,  Correct Procedure, Correct Position, site marked,  Risks and benefits discussed,  Surgical consent,  Pre-op evaluation,  At surgeon's request and post-op pain management  Laterality: Left  Prep: chloraprep       Needles:  Injection technique: Single-shot  Needle Type: Stimiplex     Needle Length: 9cm  Needle Gauge: 21     Additional Needles:   Procedures:,,,, ultrasound used (permanent image in chart),,    Narrative:  Start time: 04/21/2022 9:51 AM End time: 04/21/2022 9:56 AM Injection made incrementally with aspirations every 5 mL.  Performed by: Personally  Anesthesiologist: Lynda Rainwater, MD

## 2022-04-21 NOTE — Evaluation (Signed)
Physical Therapy Evaluation Patient Details Name: Lori Klein MRN: [id number] DOB: [birth year] Today's Date: 04/21/2022  History of Present Illness  57 yo female presents to therapy s/p L TKA on 04/21/2022 due to failure of conservative measures. Pt has PMH including but not limited to: DVT, PE, blood dyscrasia, IVC filter (3/112024) and R wrist sx.  Clinical Impression    Lori Klein is a 57 y.o. female POD 0 s/p L TKA. Patient reports IND with mobility at baseline. Patient is now limited by functional impairments (see PT problem list below) and requires min guard and cues for transfers and gait with RW. Patient was able to ambulate 45 and 30 feet with RW and min guard and cues for safe walker management. Patient educated on safe sequencing for stair mobility, car transfers, pain management, use of iceman machine and fall risk prevention with pt and family members demonstrating verbal understanding  for safe guarding position for people assisting with mobility. Patient instructed in exercises to facilitate ROM and circulation with HO provided and reviewed. Patient will benefit from continued skilled PT interventions to address impairments and progress towards PLOF. Patient has met mobility goals at adequate level for discharge home with family support and OPPT; will continue to follow if pt continues acute stay to progress towards Mod I goals.      Recommendations for follow up therapy are one component of a multi-disciplinary discharge planning process, led by the attending physician.  Recommendations may be updated based on patient status, additional functional criteria and insurance authorization.  Follow Up Recommendations Outpatient PT      Assistance Recommended at Discharge Intermittent Supervision/Assistance  Patient can return home with the following  A little help with walking and/or transfers;A little help with bathing/dressing/bathroom;Assistance with  cooking/housework;Assist for transportation;Help with stairs or ramp for entrance    Equipment Recommendations Rolling walker (2 wheels) (provided and adjusted in home setting, pt reports iceman machine in home setting)  Recommendations for Other Services       Functional Status Assessment Patient has had a recent decline in their functional status and demonstrates the ability to make significant improvements in function in a reasonable and predictable amount of time.     Precautions / Restrictions Precautions Precautions: Knee;Fall Restrictions Weight Bearing Restrictions: No LLE Weight Bearing: Weight bearing as tolerated      Mobility  Bed Mobility Overal bed mobility: Needs Assistance Bed Mobility: Supine to Sit     Supine to sit: Min guard     General bed mobility comments: cues for technique    Transfers Overall transfer level: Needs assistance Equipment used: Rolling walker (2 wheels) Transfers: Sit to/from Stand Sit to Stand: Min guard           General transfer comment: cues for proper UE, LLE and AD placement bed, recliner and commode    Ambulation/Gait Ambulation/Gait assistance: Min guard Gait Distance (Feet): 45 Feet Assistive device: Rolling walker (2 wheels) Gait Pattern/deviations: Step-to pattern, Trunk flexed Gait velocity: decreased     General Gait Details: close proximity to [employer] Stairs: Yes Stairs assistance: Min guard Stair Management: Two rails Number of Stairs: 2 (6 inch) General stair comments: PT able to educate pt and husband on safety wtih step navigaiton  Wheelchair Mobility    Modified Rankin (Stroke Patients Only)       Balance Overall balance assessment: Needs assistance Sitting-balance support: No upper extremity supported, Feet unsupported Sitting balance-Leahy Scale: Fair     Standing balance  support: No upper extremity supported (for static standing, B UE support at [facility] for dynamic) Standing  balance-Leahy Scale: Fair                               Pertinent Vitals/Pain Pain Assessment Pain Assessment: 0-10 Pain Score: 3  Pain Location: L LE Pain Descriptors / Indicators: Constant, Aching, Operative site guarding Pain Intervention(s): Limited activity within patient's tolerance, Monitored during session, Premedicated before session, Repositioned, Ice applied    Home Living Family/patient expects to be discharged to:: Private residence Living Arrangements: Spouse/significant other Available Help at Discharge: Family Type of Home: House Home Access: Stairs to enter Entrance Stairs-Rails: None Entrance Stairs-Number of Steps: 1 (threshold) Alternate Level Stairs-Number of Steps: 15 Home Layout: Two level;Bed/bath upstairs Home Equipment: Shower seat - built in;BSC/3in1      Prior Function Prior Level of Function : Independent/Modified Independent;Driving;Working/employed             Mobility Comments: IND with ADLs, self care tasks, IADLs no AD       Hand Dominance        Extremity/Trunk Assessment        Lower Extremity Assessment Lower Extremity Assessment: LLE deficits/detail LLE Deficits / Details: ankle DF/PF 5/5, SLR , < 5 degree lad LLE Sensation: WNL       Communication   Communication: No difficulties  Cognition Arousal/Alertness: Awake/alert Behavior During Therapy: WFL for tasks assessed/performed Overall Cognitive Status: Within Functional Limits for tasks assessed                                          General Comments      Exercises Total Joint Exercises Ankle Circles/Pumps: AROM, Both, 20 reps Quad Sets: AROM, Left, 5 reps Heel Slides: AROM, Left, 5 reps Hip ABduction/ADduction: AROM, Left, 5 reps, Supine Straight Leg Raises: AROM, Left, 5 reps Long Arc Quad: AROM, Left, 5 reps   Assessment/Plan    PT Assessment Patient needs continued PT services  PT Problem List Decreased  strength;Decreased range of motion;Decreased activity tolerance;Decreased balance;Decreased mobility;Decreased coordination;Decreased knowledge of use of DME;Pain       PT Treatment Interventions DME instruction;Gait training;Stair training;Functional mobility training;Therapeutic activities;Therapeutic exercise;Neuromuscular re-education;Balance training;Patient/family education;Modalities    PT Goals (Current goals can be found in the Care Plan section)  Acute Rehab PT Goals Patient Stated Goal: to be able to get back to pilaties, walking up stairs without going sideways and be able to fly in November PT Goal Formulation: With patient/family Time For Goal Achievement: 05/04/22 Potential to Achieve Goals: Good    Frequency       Co-evaluation               AM-PAC PT "6 Clicks" Mobility  Outcome Measure Help needed turning from your back to your side while in a flat bed without using bedrails?: None Help needed moving from lying on your back to sitting on the side of a flat bed without using bedrails?: A Little Help needed moving to and from a bed to a chair (including a wheelchair)?: A Little Help needed standing up from a chair using your arms (e.g., wheelchair or bedside chair)?: A Little Help needed to walk in hospital room?: A Little Help needed climbing 3-5 steps with a railing? : A Little 6 Click Score: 19  End of Session Equipment Utilized During Treatment: Gait belt Activity Tolerance: No increased pain;Patient tolerated treatment well Patient left: in chair;with call bell/phone within reach;with family/visitor present Nurse Communication: Mobility status;Other (comment) (progresison toward d/c) PT Visit Diagnosis: Unsteadiness on feet (R26.81);Other abnormalities of gait and mobility (R26.89);Muscle weakness (generalized) (M62.81);Pain Pain - Right/Left: Left Pain - part of body: Knee    Time: 1630-1720 PT Time Calculation (min) (ACUTE ONLY): 50  min   Charges:   PT Evaluation $PT Eval Low Complexity: 1 Low PT Treatments $Gait Training: 8-22 mins $Therapeutic Exercise: 8-22 mins        Baird Lyons, PT   Adair Patter 04/21/2022, 5:35 PM

## 2022-04-21 NOTE — Discharge Instructions (Signed)
 Dr.   [facility] [address] [place], [place] [address] [phone]  TOTAL KNEE REPLACEMENT POSTOPERATIVE DIRECTIONS    Knee Rehabilitation, Guidelines Following Surgery  Results after knee surgery are often greatly improved when you follow the exercise, range of motion and muscle strengthening exercises prescribed by your doctor. Safety measures are also important to protect the knee from further injury. Any time any of these exercises cause you to have increased pain or swelling in your knee joint, decrease the amount until you are comfortable again and slowly increase them. If you have problems or questions, call your caregiver or physical therapist for advice.   WEIGHT BEARING Weight bearing as tolerated with assist device (walker, cane, etc) as directed, use it as long as suggested by your surgeon or therapist, typically at least 4-6 weeks.  HOME CARE INSTRUCTIONS  Remove items at home which could result in a fall. This includes throw rugs or furniture in walking pathways.  Continue medications as instructed at time of discharge. You may have some home medications which will be placed on hold until you complete the course of blood thinner medication.  You may start showering once you are discharged home but do not submerge the incision under water. Just pat the incision dry and apply a dry gauze dressing on daily. Walk with walker as instructed.  You may resume a sexual relationship in one month or when given the OK by your doctor.  Use walker as long as suggested by your caregivers. Avoid periods of inactivity such as sitting longer than an hour when not asleep. This helps prevent blood clots.  You may put full weight on your legs and walk as much as is comfortable.  You may return to work once you are cleared by your doctor.  Do not drive a car for 6 weeks or until released by you surgeon.  Do not drive while  taking narcotics.  Wear the elastic stockings for three weeks following surgery during the day but you may remove then at night. Make sure you keep all of your appointments after your operation with all of your doctors and caregivers. You should call the office at the above phone number and make an appointment for approximately two weeks after the date of your surgery. Do not remove your surgical dressing. The dressing is waterproof; you may take showers in 3 days, but do not take tub baths or submerge the dressing. Please pick up a stool softener and laxative for home use as long as you are requiring pain medications. ICE to the affected knee every three hours for 30 minutes at a time and then as needed for pain and swelling.  Continue to use ice on the knee for pain and swelling from surgery. You may notice swelling that will progress down to the foot and ankle.  This is normal after surgery.  Elevate the leg when you are not up walking on it.   It is important for you to complete the blood thinner medication as prescribed by your doctor. Continue to use the breathing machine which will help keep your temperature down.  It is common for your temperature to cycle up and down following surgery, especially at night when you are not up moving around and exerting yourself.  The breathing machine keeps your lungs expanded and your temperature down.  RANGE OF MOTION AND STRENGTHENING EXERCISES  Rehabilitation of the knee is important following a knee injury or an   operation. After just a few days of immobilization, the muscles of the thigh which control the knee become weakened and shrink (atrophy). Knee exercises are designed to build up the tone and strength of the thigh muscles and to improve knee motion. Often times heat used for twenty to thirty minutes before working out will loosen up your tissues and help with improving the range of motion but do not use heat for the first two weeks following surgery.  These exercises can be done on a training (exercise) mat, on the floor, on a table or on a bed. Use what ever works the best and is most comfortable for you Knee exercises include:  Leg Lifts - While your knee is still immobilized in a splint or cast, you can do straight leg raises. Lift the leg to 60 degrees, hold for 3 sec, and slowly lower the leg. Repeat 10-20 times 2-3 times daily. Perform this exercise against resistance later as your knee gets better.  Quad and Hamstring Sets - Tighten up the muscle on the front of the thigh (Quad) and hold for 5-10 sec. Repeat this 10-20 times hourly. Hamstring sets are done by pushing the foot backward against an object and holding for 5-10 sec. Repeat as with quad sets.  A rehabilitation program following serious knee injuries can speed recovery and prevent re-injury in the future due to weakened muscles. Contact your doctor or a physical therapist for more information on knee rehabilitation.   POST-OPERATIVE OPIOID TAPER INSTRUCTIONS: It is important to wean off of your opioid medication as soon as possible. If you do not need pain medication after your surgery it is ok to stop day one. Opioids include: Codeine, Hydrocodone(Norco, Vicodin), Oxycodone(Percocet, oxycontin) and hydromorphone amongst others.  Long term and even short term use of opiods can cause: Increased pain response Dependence Constipation Depression Respiratory depression And more.  Withdrawal symptoms can include Flu like symptoms Nausea, vomiting And more Techniques to manage these symptoms Hydrate well Eat regular healthy meals Stay active Use relaxation techniques(deep breathing, meditating, yoga) Do Not substitute Alcohol to help with tapering If you have been on opioids for less than two weeks and do not have pain than it is ok to stop all together.  Plan to wean off of opioids This plan should start within one week post op of your joint replacement. Maintain the same  interval or time between taking each dose and first decrease the dose.  Cut the total daily intake of opioids by one tablet each day Next start to increase the time between doses. The last dose that should be eliminated is the evening dose.    SKILLED REHAB INSTRUCTIONS: If the patient is transferred to a skilled rehab facility following release from the hospital, a list of the current medications will be sent to the facility for the patient to continue.  When discharged from the skilled rehab facility, please have the facility set up the patient's Home Health Physical Therapy prior to being released. Also, the skilled facility will be responsible for providing the patient with their medications at time of release from the facility to include their pain medication, the muscle relaxants, and their blood thinner medication. If the patient is still at the rehab facility at time of the two week follow up appointment, the skilled rehab facility will also need to assist the patient in arranging follow up appointment in our office and any transportation needs.  MAKE SURE YOU:  Understand these instructions.  Will watch   your condition.  Will get help right away if you are not doing well or get worse.    Pick up stool softner and laxative for home use following surgery while on pain medications. Do NOT remove your dressing. You may shower.  Do not take tub baths or submerge incision under water. May shower starting three days after surgery. Please use a clean towel to pat the incision dry following showers. Continue to use ice for pain and swelling after surgery. Do not use any lotions or creams on the incision until instructed by your surgeon.  

## 2022-04-22 ENCOUNTER — Encounter (HOSPITAL_COMMUNITY): Payer: Self-pay | Admitting: Orthopedic Surgery

## 2022-04-22 NOTE — Anesthesia Postprocedure Evaluation (Signed)
Anesthesia Post Note  Patient: Lori Klein  Procedure(s) Performed: COMPUTER ASSISTED TOTAL KNEE ARTHROPLASTY (Left: Knee)     Patient location during evaluation: PACU Anesthesia Type: Regional and Spinal Level of consciousness: awake and alert Pain management: pain level controlled Vital Signs Assessment: post-procedure vital signs reviewed and stable Respiratory status: spontaneous breathing, nonlabored ventilation and respiratory function stable Cardiovascular status: blood pressure returned to baseline and stable Postop Assessment: no apparent nausea or vomiting Anesthetic complications: no   No notable events documented.  Last Vitals:  Vitals:   04/21/22 1500 04/21/22 1600  BP: 122/61 117/71  Pulse: 72 65  Resp:    Temp:    SpO2: 100% 100%    Last Pain:  Vitals:   04/21/22 1434  TempSrc:   PainSc: 0-No pain   Pain Goal: Patients Stated Pain Goal: 1 (04/21/22 1415)                 Lynda Rainwater

## 2022-04-27 ENCOUNTER — Ambulatory Visit: Payer: Federal, State, Local not specified - PPO | Attending: Podiatry

## 2022-04-27 ENCOUNTER — Other Ambulatory Visit (HOSPITAL_BASED_OUTPATIENT_CLINIC_OR_DEPARTMENT_OTHER): Payer: Self-pay

## 2022-04-27 ENCOUNTER — Other Ambulatory Visit: Payer: Self-pay

## 2022-04-27 DIAGNOSIS — R293 Abnormal posture: Secondary | ICD-10-CM | POA: Diagnosis present

## 2022-04-27 DIAGNOSIS — M25662 Stiffness of left knee, not elsewhere classified: Secondary | ICD-10-CM | POA: Insufficient documentation

## 2022-04-27 DIAGNOSIS — Z96652 Presence of left artificial knee joint: Secondary | ICD-10-CM | POA: Diagnosis present

## 2022-04-27 DIAGNOSIS — R262 Difficulty in walking, not elsewhere classified: Secondary | ICD-10-CM | POA: Diagnosis present

## 2022-04-27 NOTE — Therapy (Signed)
OUTPATIENT PHYSICAL THERAPY LOWER EXTREMITY EVALUATION   Patient Name: Lori Klein MRN: [id number] DOB:[birth year], 57 y.o., female Today's Date: 04/27/2022  END OF SESSION:  PT End of Session - 04/27/22 1359     Visit Number 1    Date for PT Re-Evaluation 07/20/22    PT Start Time 1400    PT Stop Time 1445    PT Time Calculation (min) 45 min    Activity Tolerance No increased pain;Patient tolerated treatment well    Behavior During Therapy [facility] for tasks assessed/performed             Past Medical History:  Diagnosis Date   Anxiety    Arthritis    Blood dyscrasia    has sickle cell trait   DVT (deep venous thrombosis) (HCC)    Headache    migraine   PONV (postoperative nausea and vomiting)    Pulmonary embolism [facility])    Past Surgical History:  Procedure Laterality Date   CHOLECYSTECTOMY  2008   laparoscopic   IVC FILTER INSERTION N/A 04/12/2022   Procedure: IVC FILTER INSERTION;  Surgeon: Waynetta Sandy, MD;  Location: [place] [facility];  Service: Cardiovascular;  Laterality: N/A;   IVC FILTER INSERTION Left    IVC VENOGRAPHY N/A 04/12/2022   Procedure: IVC Venography;  Surgeon: Waynetta Sandy, MD;  Location: [place] [facility];  Service: Cardiovascular;  Laterality: N/A;   KNEE ARTHROPLASTY Left 04/21/2022   Procedure: COMPUTER ASSISTED TOTAL KNEE ARTHROPLASTY;  Surgeon: Rod Can, MD;  Location: [facility];  Service: Orthopedics;  Laterality: Left;   ulnar surgery Left 2017   released compressed nerve   WRIST SURGERY Right    nerve de fragmentation   Patient Active Problem List   Diagnosis Date Noted   Osteoarthritis of left knee 04/21/2022   Bilateral pulmonary embolism ([place]) 09/27/2015   Acute deep vein thrombosis (DVT) of right lower extremity ([place]) 09/27/2015    PCP: Herschell Dimes  REFERRING PROVIDER: Rod Can  REFERRING DIAG: L TKA  THERAPY DIAG:  History of total knee arthroplasty, left  Difficulty in  walking, not elsewhere classified  Abnormal posture  Stiffness of left knee, not elsewhere classified  Rationale for Evaluation and Treatment: Rehabilitation  ONSET DATE: 04/21/22  SUBJECTIVE:   SUBJECTIVE STATEMENT: I feel like the TED hose are bunching up and causing more swelling behind my knee so I left them off today.  PERTINENT HISTORY: Chronic B knee OA, s/p L TKA 04/21/22 PAIN:  Are you having pain? Yes: NPRS scale: 5/10 Pain location: L knee Pain description: pulling, grabbing above L knee cap Aggravating factors: bending Relieving factors: rest elevation  PRECAUTIONS: None  WEIGHT BEARING RESTRICTIONS: No  FALLS:  Has patient fallen in last 6 months? 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  No warmth,  drainage, change in skin color noted around aquacel bandage    POSTURE: flexed B knees to 20 degrees and B hips 15 degrees in standing, R knee varum posture  PALPATION: Normal L patellar mobility, mild tissue restriction/ tightness L distal quads   LOWER EXTREMITY ROM:  Active ROM Right eval Left eval  Hip flexion    Hip extension    Hip abduction    Hip adduction    Hip internal rotation    Hip external rotation    Knee flexion 120 75  Knee extension -12 -13  Ankle dorsiflexion    Ankle plantarflexion    Ankle  inversion    Ankle eversion     (Blank rows = not tested)  LOWER EXTREMITY MMT:  MMT Right eval Left eval  Hip flexion  4-  Hip extension    Hip abduction    Hip adduction    Hip internal rotation    Hip external rotation    Knee flexion  3  Knee extension  3-  Ankle dorsiflexion    Ankle plantarflexion    Ankle inversion    Ankle eversion     (Blank rows = not tested)   FUNCTIONAL TESTS:  Timed up and go (TUG): 32s  GAIT: Distance walked: 37' Assistive device utilized: Environmental consultant - 2 wheeled Level of assistance: Modified independence Comments: very stiff B LE's, maintains B knees locked at 15 to 20 degrees flexion, wide base, leans heavily forward on walker   TODAY'S TREATMENT:                                                                                                                              DATE: 04/27/22 : Therapeutic exercise: Supine for L knee quad sets with towel roll under L knee for tactile cuing to improve motor recruitment Supine SLR Long sitting for ankle pumps with black t band Seated long arc quads, added black t band for assist with terminal extension Lunges on step stool with hands on walker for gentle closed chain flexion stretch Nustep 5 min, level 3 for gentle ROM L knee  Vasopneumatic: 10 min game ready L LE with L LE elevated for post exercise edema, pain  PATIENT EDUCATION:  Education details: POC, goals Person educated: Patient Education method: Explanation, Demonstration, Tactile cues, Verbal cues, and Handouts Education comprehension: verbalized understanding  HOME EXERCISE PROGRAM: Access Code: [email or web address] URL: [email or web address] Date: 04/27/2022 Prepared by: Hayden Pedro  Exercises - Supine Quad Set  - 2 x daily - 7 x weekly - 1 sets - 10 reps - Active Straight Leg Raise with Quad Set  - 2 x daily - 7 x weekly - 1 sets - 10 reps - Standing Knee Flexion Stretch on Step  - 2 x daily - 7 x weekly - 1 sets - 10 reps -  Long Sitting Ankle Plantar Flexion with Resistance  - 2 x daily - 7 x weekly - 1 sets -  10 reps  ASSESSMENT:  CLINICAL IMPRESSION: Patient is a 57 y.o. female who was seen today for physical therapy evaluation and treatment for s/p L TKA 04/21/22.  Presents with pain L knee, edema L LE, and limited ROM, strength as expected.  Also has flexion contracture R knee of 12 degrees due to bony changes from arthritis as well. She is very motivated, fit, typically active. Should benefit from skilled PT to address her goals.  Her most pressing issue today is her limited flexion ROM L.   OBJECTIVE IMPAIRMENTS: Abnormal gait, decreased activity tolerance, decreased endurance, decreased mobility, difficulty walking, decreased ROM, decreased strength, hypomobility, increased edema, impaired flexibility, and pain.   ACTIVITY LIMITATIONS: carrying, lifting, bending, standing, squatting, sleeping, stairs, transfers, bed mobility, and locomotion level  PARTICIPATION LIMITATIONS: meal prep, cleaning, laundry, driving, shopping, community activity, occupation, yard work, school, and church  PERSONAL FACTORS: Time since onset of injury/illness/exacerbation and 1 comorbidity: R knee OA  are also affecting patient's functional outcome.   REHAB POTENTIAL: Good  CLINICAL DECISION MAKING: Stable/uncomplicated  EVALUATION COMPLEXITY: Low   GOALS: Goals reviewed with patient? Yes  SHORT TERM GOALS: Target date: 05/11/22  HEP Baseline:established today at eval Goal status: INITIAL   LONG TERM GOALS: Target date: 07/20/22  Tug score less than 14 sec Baseline: 32 sec with r walker Goal status: INITIAL  2.  LEFS improve to 55/80 Baseline: 6/80 Goal status: INITIAL  3.  ROM L knee -5 ext to 115 flexion Baseline: -13 to 75 Goal status: INITIAL  4.  Strength L quads, hamstrings 4+/5 for improved efficiency with gait and transfers Baseline: 3-/5 Goal status: INITIAL   PLAN:  PT FREQUENCY: 2x/week  PT  DURATION: 12 weeks  PLANNED INTERVENTIONS: Therapeutic exercises, Therapeutic activity, Neuromuscular re-education, Balance training, Gait training, Patient/Family education, Self Care, and Joint mobilization  PLAN FOR NEXT SESSION: progress with gait, additional flexibility and strengthening L LE/knee    L , PT 04/27/2022, 5:41 PM

## 2022-04-28 NOTE — Addendum Note (Signed)
Addended byMariana Arn,  L on: 04/28/2022 09:34 AM   Modules accepted: Orders

## 2022-04-29 ENCOUNTER — Other Ambulatory Visit (HOSPITAL_BASED_OUTPATIENT_CLINIC_OR_DEPARTMENT_OTHER): Payer: Self-pay

## 2022-04-29 MED ORDER — OXYCODONE HCL 5 MG PO TABS
5.0000 mg | ORAL_TABLET | Freq: Four times a day (QID) | ORAL | 0 refills | Status: DC | PRN
  Filled 2022-04-29: qty 28, 7d supply, fill #0

## 2022-05-03 ENCOUNTER — Ambulatory Visit: Payer: Federal, State, Local not specified - PPO | Attending: Orthopedic Surgery

## 2022-05-03 ENCOUNTER — Other Ambulatory Visit (HOSPITAL_COMMUNITY): Payer: Self-pay

## 2022-05-03 DIAGNOSIS — M25662 Stiffness of left knee, not elsewhere classified: Secondary | ICD-10-CM | POA: Diagnosis present

## 2022-05-03 DIAGNOSIS — R293 Abnormal posture: Secondary | ICD-10-CM | POA: Insufficient documentation

## 2022-05-03 DIAGNOSIS — R262 Difficulty in walking, not elsewhere classified: Secondary | ICD-10-CM | POA: Insufficient documentation

## 2022-05-03 DIAGNOSIS — Z96652 Presence of left artificial knee joint: Secondary | ICD-10-CM | POA: Diagnosis not present

## 2022-05-03 NOTE — Therapy (Signed)
OUTPATIENT PHYSICAL THERAPY TREATMENT   Patient Name: Lori Klein MRN: [id number] DOB:January 20, 1966, 57 y.o., female Today's Date: 05/03/2022  END OF SESSION:  PT End of Session - 05/03/22 1035     Visit Number 2    Date for PT Re-Evaluation 07/20/22    PT Start Time 1015    PT Stop Time 1112    PT Time Calculation (min) 57 min    Activity Tolerance Patient tolerated treatment well    Behavior During Therapy [facility] for tasks assessed/performed              Past Medical History:  Diagnosis Date   Anxiety    Arthritis    Blood dyscrasia    has sickle cell trait   DVT (deep venous thrombosis)    Headache    migraine   PONV (postoperative nausea and vomiting)    Pulmonary embolism    Past Surgical History:  Procedure Laterality Date   CHOLECYSTECTOMY  2008   laparoscopic   IVC FILTER INSERTION N/A 04/12/2022   Procedure: IVC FILTER INSERTION;  Surgeon: Waynetta Sandy, MD;  Location: [place] [facility];  Service: Cardiovascular;  Laterality: N/A;   IVC FILTER INSERTION Left    IVC VENOGRAPHY N/A 04/12/2022   Procedure: IVC Venography;  Surgeon: Waynetta Sandy, MD;  Location: [place] [facility];  Service: Cardiovascular;  Laterality: N/A;   KNEE ARTHROPLASTY Left 04/21/2022   Procedure: COMPUTER ASSISTED TOTAL KNEE ARTHROPLASTY;  Surgeon: Rod Can, MD;  Location: [facility];  Service: Orthopedics;  Laterality: Left;   ulnar surgery Left 2017   released compressed nerve   WRIST SURGERY Right    nerve de fragmentation   Patient Active Problem List   Diagnosis Date Noted   Osteoarthritis of left knee 04/21/2022   Bilateral pulmonary embolism 09/27/2015   Acute deep vein thrombosis (DVT) of right lower extremity 09/27/2015    PCP: Herschell Dimes  REFERRING PROVIDER: Rod Can  REFERRING DIAG: L TKA  THERAPY DIAG:  History of total knee arthroplasty, left  Difficulty in walking, not elsewhere classified  Abnormal  posture  Stiffness of left knee, not elsewhere classified  Rationale for Evaluation and Treatment: Rehabilitation  ONSET DATE: 04/21/22  SUBJECTIVE:   SUBJECTIVE STATEMENT: Pt reports doing exercises 2x per day, walking more but feels like she will not need walker for long.  PERTINENT HISTORY: Chronic B knee OA, s/p L TKA 04/21/22 PAIN:  Are you having pain? Yes: NPRS scale: 0/10 Pain location: L knee Pain description: pulling, grabbing above L knee cap Aggravating factors: bending Relieving factors: rest elevation  PRECAUTIONS: None  WEIGHT BEARING RESTRICTIONS: No  FALLS:  Has patient fallen in last 6 months? 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  No warmth, drainage, change in skin color noted around aquacel bandage  POSTURE: flexed B knees to 20 degrees and B hips 15 degrees in standing, R knee varum posture  PALPATION: Normal L patellar mobility, mild tissue restriction/ tightness L distal quads   LOWER EXTREMITY ROM:  Active ROM Right eval Left eval  Hip flexion    Hip extension    Hip abduction    Hip adduction    Hip internal rotation    Hip external rotation    Knee flexion 120 75  Knee extension -12 -13  Ankle dorsiflexion    Ankle plantarflexion    Ankle inversion    Ankle eversion     (Blank rows = not  tested)  LOWER EXTREMITY MMT:  MMT Right eval Left eval  Hip flexion  4-  Hip extension    Hip abduction    Hip adduction    Hip internal rotation    Hip external rotation    Knee flexion  3  Knee extension  3-  Ankle dorsiflexion    Ankle plantarflexion    Ankle inversion    Ankle eversion     (Blank rows = not tested)   FUNCTIONAL TESTS:  Timed up and go (TUG): 32s  GAIT: Distance walked: 45' Assistive device utilized: Environmental consultant - 2 wheeled Level of assistance: Modified independence Comments: very stiff B LE's, maintains B knees locked at 15 to 20 degrees flexion, wide base, leans heavily forward on walker   TODAY'S TREATMENT:                                                                                                                              DATE:  05/03/22 : Therapeutic exercise: Nustep L5x73min Seated heel slides L AROM x 10; x 10 with strap  Seated LAQ L x 10  Standing heel raise  x 10 Standing mini squats x 10  Church pews x 20 Retro step x 20 bil QS towel under heel 10x5" SLR L x 10  Gait training: 92ft with SPC in L hand d/t R knee instability   Vasopneumatic: 10 min game ready L LE with L LE elevated for post exercise edema, pain   04/27/22 : Therapeutic exercise: Supine for L knee quad sets with towel roll under L knee for tactile cuing to improve motor recruitment Supine SLR Long sitting for ankle pumps with black t band Seated long arc quads, added black t band for assist with terminal extension Lunges on step stool with hands on walker for gentle closed chain flexion stretch Nustep 5 min, level 3 for gentle ROM L knee  Vasopneumatic: 10 min game ready L LE with L LE elevated for post exercise edema, pain  PATIENT EDUCATION:  Education details: POC, goals Person educated: Patient Education method: Explanation, Demonstration, Tactile cues, Verbal cues, and Handouts Education comprehension: verbalized understanding  HOME EXERCISE  PROGRAM: Access Code: [email or web address] URL: [email or web address] Date: 04/27/2022 Prepared by: Hayden Pedro  Exercises - Supine Quad Set  - 2 x daily -  7 x weekly - 1 sets - 10 reps - Active Straight Leg Raise with Quad Set  - 2 x daily - 7 x weekly - 1 sets - 10 reps - Standing Knee Flexion Stretch on Step  - 2 x daily - 7 x weekly - 1 sets - 10 reps - Long Sitting Ankle Plantar Flexion with Resistance  - 2 x daily - 7 x weekly - 1 sets - 10 reps  ASSESSMENT:  CLINICAL IMPRESSION: Advanced patient through exercises to improve strength and ROM necessary for daily activities. She showed better gait pattern with cane in L hand rather than her R hand but cues required for heel strike and upright posture. Instruction also given on trying to keep her knee extended throughout the day for prolonged stretching. She shows good quad contraction with QS and SLR. Concluded session with GR to address swelling post exercise.   OBJECTIVE IMPAIRMENTS: Abnormal gait, decreased activity tolerance, decreased endurance, decreased mobility, difficulty walking, decreased ROM, decreased strength, hypomobility, increased edema, impaired flexibility, and pain.   ACTIVITY LIMITATIONS: carrying, lifting, bending, standing, squatting, sleeping, stairs, transfers, bed mobility, and locomotion level  PARTICIPATION LIMITATIONS: meal prep, cleaning, laundry, driving, shopping, community activity, occupation, yard work, school, and church  PERSONAL FACTORS: Time since onset of injury/illness/exacerbation and 1 comorbidity: R knee OA  are also affecting patient's functional outcome.   REHAB POTENTIAL: Good  CLINICAL DECISION MAKING: Stable/uncomplicated  EVALUATION COMPLEXITY: Low   GOALS: Goals reviewed with patient? Yes  SHORT TERM GOALS: Target date: 05/11/22  HEP Baseline:established today at eval Goal status: INITIAL   LONG TERM GOALS: Target date: 07/20/22  Tug score less than 14 sec Baseline: 32  sec with r walker Goal status: INITIAL  2.  LEFS improve to 55/80 Baseline: 6/80 Goal status: INITIAL  3.  ROM L knee -5 ext to 115 flexion Baseline: -13 to 75 Goal status: INITIAL  4.  Strength L quads, hamstrings 4+/5 for improved efficiency with gait and transfers Baseline: 3-/5 Goal status: INITIAL   PLAN:  PT FREQUENCY: 2x/week  PT DURATION: 12 weeks  PLANNED INTERVENTIONS: Therapeutic exercises, Therapeutic activity, Neuromuscular re-education, Balance training, Gait training, Patient/Family education, Self Care, and Joint mobilization  PLAN FOR NEXT SESSION: progress with gait (does better with cane in L hand), additional flexibility and strengthening L LE/knee   Artist Pais, PTA 05/03/2022, 11:32 AM

## 2022-05-06 ENCOUNTER — Ambulatory Visit: Payer: Federal, State, Local not specified - PPO

## 2022-05-06 ENCOUNTER — Other Ambulatory Visit: Payer: Self-pay

## 2022-05-06 DIAGNOSIS — R262 Difficulty in walking, not elsewhere classified: Secondary | ICD-10-CM

## 2022-05-06 DIAGNOSIS — Z96652 Presence of left artificial knee joint: Secondary | ICD-10-CM

## 2022-05-06 DIAGNOSIS — R293 Abnormal posture: Secondary | ICD-10-CM

## 2022-05-06 NOTE — Therapy (Signed)
OUTPATIENT PHYSICAL THERAPY TREATMENT   Patient Name: Lori Klein MRN: [id number] DOB:05/20/1965, 57 y.o., female Today's Date: 05/06/2022  END OF SESSION:  PT End of Session - 05/06/22 1253     Visit Number 3    Progress Note Due on Visit 10    PT Start Time [id number]    PT Stop Time 1103    PT Time Calculation (min) 48 min              Past Medical History:  Diagnosis Date   Anxiety    Arthritis    Blood dyscrasia    has sickle cell trait   DVT (deep venous thrombosis)    Headache    migraine   PONV (postoperative nausea and vomiting)    Pulmonary embolism    Past Surgical History:  Procedure Laterality Date   CHOLECYSTECTOMY  2008   laparoscopic   IVC FILTER INSERTION N/A 04/12/2022   Procedure: IVC FILTER INSERTION;  Surgeon: Waynetta Sandy, MD;  Location: [place] [facility];  Service: Cardiovascular;  Laterality: N/A;   IVC FILTER INSERTION Left    IVC VENOGRAPHY N/A 04/12/2022   Procedure: IVC Venography;  Surgeon: Waynetta Sandy, MD;  Location: [place] [facility];  Service: Cardiovascular;  Laterality: N/A;   KNEE ARTHROPLASTY Left 04/21/2022   Procedure: COMPUTER ASSISTED TOTAL KNEE ARTHROPLASTY;  Surgeon: Rod Can, MD;  Location: [facility];  Service: Orthopedics;  Laterality: Left;   ulnar surgery Left 2017   released compressed nerve   WRIST SURGERY Right    nerve de fragmentation   Patient Active Problem List   Diagnosis Date Noted   Osteoarthritis of left knee 04/21/2022   Bilateral pulmonary embolism 09/27/2015   Acute deep vein thrombosis (DVT) of right lower extremity 09/27/2015    PCP: Herschell Dimes  REFERRING PROVIDER: Rod Can  REFERRING DIAG: L TKA  THERAPY DIAG:  History of total knee arthroplasty, left  Difficulty in walking, not elsewhere classified  Abnormal posture  Rationale for Evaluation and Treatment: Rehabilitation  ONSET DATE: 04/21/22  SUBJECTIVE:   SUBJECTIVE  STATEMENT: Stopped the oxycodone yesterday so a little uncomfortable last night. PERTINENT HISTORY: Chronic B knee OA, s/p L TKA 04/21/22 PAIN:  Are you having pain? Yes: NPRS scale: 0/10 Pain location: L knee Pain description: pulling, grabbing above L knee cap Aggravating factors: bending Relieving factors: rest elevation  PRECAUTIONS: None  WEIGHT BEARING RESTRICTIONS: No  FALLS:  Has patient fallen in last 6 months? 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  No warmth, drainage, change in skin color noted around aquacel bandage    POSTURE: flexed B knees to 20 degrees and B hips 15 degrees in standing, R knee varum posture  PALPATION: Normal L patellar mobility, mild tissue restriction/ tightness L distal quads   LOWER  EXTREMITY ROM:  Active ROM Right eval Left eval  Hip flexion    Hip extension    Hip abduction    Hip adduction    Hip internal rotation    Hip external rotation    Knee flexion 120 75  Knee extension -12 -13  Ankle dorsiflexion    Ankle plantarflexion    Ankle inversion    Ankle eversion     (Blank rows = not tested)  LOWER EXTREMITY MMT:  MMT Right eval Left eval  Hip flexion  4-  Hip extension    Hip abduction    Hip adduction    Hip internal rotation    Hip external rotation    Knee flexion  3  Knee  extension  3-  Ankle dorsiflexion    Ankle plantarflexion    Ankle inversion    Ankle eversion     (Blank rows = not tested)   FUNCTIONAL TESTS:  Timed up and go (TUG): 32s  GAIT: Distance walked: 30' Assistive device utilized: Environmental consultant - 2 wheeled Level of assistance: Modified independence Comments: very stiff B LE's, maintains B knees locked at 15 to 20 degrees flexion, wide base, leans heavily forward on walker   TODAY'S TREATMENT:                                                                                                                              DATE:  05/06/22: Therapeutic exercise: Nustep level 5, 6 min Seated L knee flexion slides with pillowcase under L foot, 10 x AROM Seated L knee flexion with therapist applying overpressure, contract /relax with  knee extension resistance, measured at 84 degrees flexion at end of this ex Standing posterior wall leans with heel rocks to cue terminal quads Prone TKE's, B, 5 sec holds, bolster under ankles, 10 reps Prone manually assisted L knee bends, 10 reps, gentle overpressure by PT Standing towel slides with R LE and L LE weight bearing, for/ back, side/side L UE support, 15 reps each encouraged to elongate R posterior leg movement Supine for L post knee capsule stretch for extension with bolster under L ankle x 1 1/2 min,  measured extension -4.  Vasopneumatic:  34 degrees, with L LE elevated, at end of session for edema, pain management   05/03/22 : Therapeutic exercise: Nustep L5x69min Seated heel slides L AROM x 10; x 10 with strap  Seated LAQ L x 10  Standing heel raise  x 10 Standing mini squats x 10  Church pews x 20 Retro step x 20 bil QS towel under heel 10x5" SLR L x 10  Gait training: 70ft with SPC in L hand d/t R knee instability   Vasopneumatic: 10 min game ready L LE with L LE elevated for post exercise edema, pain   04/27/22 : Therapeutic exercise: Supine for L knee quad sets with towel roll under L  knee for tactile cuing to improve motor recruitment Supine SLR  Long sitting for ankle pumps with black t band Seated long arc quads, added black t band for assist with terminal extension Lunges on step stool with hands on walker for gentle closed chain flexion stretch Nustep 5 min, level 3 for gentle ROM L knee  Vasopneumatic: 10 min game ready L LE with L LE elevated for post exercise edema, pain  PATIENT EDUCATION:  Education details: POC, goals Person educated: Patient Education method: Explanation, Demonstration, Tactile cues, Verbal cues, and Handouts Education comprehension: verbalized understanding  HOME EXERCISE PROGRAM: Access Code: [email or web address] URL: [email or web address] Date: 05/06/2022 Prepared by:    Exercises - Prone Quadriceps Set  - 1 x daily - 7 x weekly - 1 sets - 10 reps - 5 sec hold Access Code: [email or web address] Access Code: [email or web address] URL: [email or web address] Date: 04/27/2022 Prepared by: Hayden Pedro  Exercises - Supine Quad Set  - 2 x daily - 7 x weekly - 1 sets - 10 reps - Active Straight Leg Raise with Quad Set  - 2 x daily - 7 x weekly - 1 sets - 10 reps - Standing Knee Flexion Stretch on Step  - 2 x daily - 7 x weekly - 1 sets - 10 reps - Long Sitting Ankle Plantar Flexion with Resistance  - 2 x daily - 7 x weekly - 1 sets - 10 reps  ASSESSMENT:  CLINICAL IMPRESSION: 2 weeks post op from L TKA.  Had bandage removed at surgeon this week. Incision healing well.  No draining, mild edema now L knee and thigh.  Improved L knee flexion ROM, improving quads  recruitment and posture.  Progressed with additional closed chain and weight bearing activities today.  Continues to benefit from skilled PT to address her goals and progress from L TKA.   OBJECTIVE IMPAIRMENTS: Abnormal gait, decreased activity tolerance, decreased endurance, decreased mobility, difficulty walking, decreased ROM, decreased strength, hypomobility, increased edema,  impaired flexibility, and pain.   ACTIVITY LIMITATIONS: carrying, lifting, bending, standing, squatting, sleeping, stairs, transfers, bed mobility, and locomotion level  PARTICIPATION LIMITATIONS: meal prep, cleaning, laundry, driving, shopping, community activity, occupation, yard work, school, and church  PERSONAL FACTORS: Time since onset of injury/illness/exacerbation and 1 comorbidity: R knee OA  are also affecting patient's functional outcome.   REHAB POTENTIAL: Good  CLINICAL DECISION MAKING: Stable/uncomplicated  EVALUATION COMPLEXITY: Low   GOALS: Goals reviewed with patient? Yes  SHORT TERM GOALS: Target date: 05/11/22  HEP Baseline:established today at eval Goal status: IN PROGRESS   LONG TERM GOALS: Target date: 07/20/22  Tug score less than 14 sec Baseline: 32 sec with r walker Goal status: INITIAL  2.  LEFS improve to 55/80 Baseline: 6/80 Goal status: INITIAL  3.  ROM L knee -5 ext to 115 flexion Baseline: -13 to 75 Goal status: IN PROGRESS  05/06/22: flexion 84, ext -4  4.  Strength L quads, hamstrings 4+/5 for improved efficiency with gait and transfers Baseline: 3-/5 Goal status: IN PROGRESS   PLAN:  PT FREQUENCY: 2x/week  PT DURATION: 12 weeks  PLANNED INTERVENTIONS: Therapeutic exercises, Therapeutic activity, Neuromuscular re-education, Balance training, Gait training, Patient/Family education, Self Care, and Joint mobilization  PLAN FOR NEXT SESSION: progress with gait (does better with cane in L hand), additional flexibility and strengthening L LE/knee    L , PT 05/06/2022, 12:58 PM

## 2022-05-10 ENCOUNTER — Encounter: Payer: Self-pay | Admitting: Physical Therapy

## 2022-05-10 ENCOUNTER — Ambulatory Visit: Payer: Federal, State, Local not specified - PPO | Admitting: Physical Therapy

## 2022-05-10 DIAGNOSIS — Z96652 Presence of left artificial knee joint: Secondary | ICD-10-CM | POA: Diagnosis not present

## 2022-05-10 DIAGNOSIS — R262 Difficulty in walking, not elsewhere classified: Secondary | ICD-10-CM

## 2022-05-10 DIAGNOSIS — M25662 Stiffness of left knee, not elsewhere classified: Secondary | ICD-10-CM

## 2022-05-10 DIAGNOSIS — R293 Abnormal posture: Secondary | ICD-10-CM

## 2022-05-10 NOTE — Therapy (Signed)
OUTPATIENT PHYSICAL THERAPY TREATMENT   Patient Name: Lori Klein MRN: [id number] DOB:04/13/1965, 57 y.o., female Today's Date: 05/10/2022  END OF SESSION:  PT End of Session - 05/10/22 1021     Visit Number 4    Date for PT Re-Evaluation 07/20/22    Progress Note Due on Visit 10    PT Start Time 1019    PT Stop Time 1108    PT Time Calculation (min) 49 min              Past Medical History:  Diagnosis Date   Anxiety    Arthritis    Blood dyscrasia    has sickle cell trait   DVT (deep venous thrombosis)    Headache    migraine   PONV (postoperative nausea and vomiting)    Pulmonary embolism    Past Surgical History:  Procedure Laterality Date   CHOLECYSTECTOMY  2008   laparoscopic   IVC FILTER INSERTION N/A 04/12/2022   Procedure: IVC FILTER INSERTION;  Surgeon: Maeola Harman, MD;  Location: [facility];  Service: Cardiovascular;  Laterality: N/A;   IVC FILTER INSERTION Left    IVC VENOGRAPHY N/A 04/12/2022   Procedure: IVC Venography;  Surgeon: Maeola Harman, MD;  Location: [facility];  Service: Cardiovascular;  Laterality: N/A;   KNEE ARTHROPLASTY Left 04/21/2022   Procedure: COMPUTER ASSISTED TOTAL KNEE ARTHROPLASTY;  Surgeon: Samson Frederic, MD;  Location: [facility];  Service: Orthopedics;  Laterality: Left;   ulnar surgery Left 2017   released compressed nerve   WRIST SURGERY Right    nerve de fragmentation   Patient Active Problem List   Diagnosis Date Noted   Osteoarthritis of left knee 04/21/2022   Bilateral pulmonary embolism 09/27/2015   Acute deep vein thrombosis (DVT) of right lower extremity 09/27/2015    PCP: Tiana Loft  REFERRING PROVIDER: Samson Frederic  REFERRING DIAG: L TKA  THERAPY DIAG:  History of total knee arthroplasty, left  Difficulty in walking, not elsewhere classified  Abnormal posture  Stiffness of left knee, not elsewhere classified  Rationale for Evaluation and  Treatment: Rehabilitation  ONSET DATE: 04/21/22  SUBJECTIVE:   SUBJECTIVE STATEMENT: Doing well, using cane now "gotten rid of walker."  Having trouble sleeping at night.  No pain just tightness today.   PERTINENT HISTORY: Chronic B knee OA, s/p L TKA 04/21/22 PAIN:  Are you having pain? Yes: NPRS scale: 0/10 Pain location: L knee Pain description: pulling, grabbing above L knee cap Aggravating factors: bending Relieving factors: rest elevation  PRECAUTIONS: None  WEIGHT BEARING RESTRICTIONS: No  FALLS:  Has patient fallen in last 6 months? 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  No warmth, drainage, change in skin color noted around aquacel bandage    POSTURE: flexed B knees to  20 degrees and B hips 15 degrees in standing, R knee varum posture  PALPATION: Normal L patellar mobility, mild tissue restriction/ tightness L distal quads   LOWER EXTREMITY ROM:  Active ROM Right eval Left eval Left 05/10/22  Knee flexion 120 75 105  Knee extension -12 -13 -10   (Blank rows = not tested)  LOWER EXTREMITY MMT:  MMT Right eval Left eval  Hip flexion  4-  Hip extension    Hip abduction    Hip adduction    Hip internal rotation    Hip external rotation    Knee flexion  3  Knee extension  3-  Ankle dorsiflexion    Ankle plantarflexion     Ankle inversion    Ankle eversion     (Blank rows = not tested)   FUNCTIONAL TESTS:  Timed up and go (TUG): 32s  GAIT: Distance walked: 6570' Assistive device utilized: Environmental consultantWalker - 2 wheeled Level of assistance: Modified independence Comments: very stiff B LE's, maintains B knees locked at 15 to 20 degrees flexion, wide base, leans heavily forward on walker   TODAY'S TREATMENT:                                                                                                                              DATE:  05/10/22 Therapeutic Exercise: to improve strength and mobility.  Demo, verbal and tactile cues throughout for technique. Nustep L5 x 6 min Step ups (1 riser) 2 x 10 LLE (1 x 10 RLE) 2 UE support.  Knee flexion stretch on step x 10 Step down (no riser) 2 x 10 LLE only Eccentric heel raises 2 x 10 on step  Standing TKE at wall, ball behind knee, with isometric hold 10 x 5 sec Prone hip extension x 10 bil  Prone knee bends 2 x 10 LLE Manual Therapy: to decrease muscle spasm and pain and improve mobility Patellar mobilization, scar mobilization, Prone knee flexion stretch with hip hyperextended for rectus stretch, starting with rotational movements, contract/relax at end range. * Vasopneumatic: 10 min game ready, med compression coldest (36 deg),  L LE with L LE elevated for post exercise edema, pain  05/06/22: Therapeutic exercise: Nustep level 5, 6 min Seated L knee flexion slides with pillowcase under L foot, 10 x AROM Seated L knee flexion with therapist applying overpressure, contract /relax with  knee extension resistance, measured at 84 degrees flexion at end of this ex Standing posterior wall leans with heel rocks to cue terminal quads Prone TKE's, B, 5 sec holds, bolster under ankles, 10 reps Prone manually assisted L knee bends, 10 reps, gentle overpressure by PT Standing towel slides with R LE and L LE weight bearing, for/ back, side/side L UE support, 15 reps each  encouraged to elongate R posterior leg movement Supine for L post knee capsule stretch for extension with bolster under L ankle x 1 1/2 min,  measured extension -4.  Vasopneumatic:  34 degrees, with L LE elevated, at end of session for edema, pain management   05/03/22 : Therapeutic exercise: Nustep L5x34min Seated heel slides L AROM x 10; x 10 with strap  Seated LAQ L x 10  Standing heel raise  x 10 Standing mini squats x 10  Church pews x 20 Retro step x 20 bil QS towel under heel 10x5" SLR L x 10  Gait training: 63ft with SPC in L hand d/t R knee instability   Vasopneumatic: 10 min game ready L LE with L LE elevated for post exercise edema, pain   PATIENT EDUCATION:  Education details: HEP update Person educated: Patient Education method: Explanation, Demonstration, Tactile cues, Verbal cues, and Handouts Education comprehension: verbalized understanding  HOME EXERCISE PROGRAM: Access Code: [email or web address] URL: [email or web address] Date: 05/10/2022 Prepared by: Harrie Foreman  Exercises - Supine Quad Set  - 2 x daily - 7 x weekly - 1 sets - 10 reps - Active Straight Leg Raise with Quad Set  - 2 x daily - 7 x weekly - 1 sets - 10 reps - Standing Knee Flexion Stretch on Step  - 2 x daily - 7 x weekly - 1 sets - 10 reps - Long Sitting Ankle Plantar Flexion with Resistance  - 2 x daily - 7 x weekly - 1 sets - 10 reps - Prone Quadriceps Set with Towel Roll  - 1 x daily - 7 x weekly - 3 sets - 10 reps - Terminal Knee Extension with Ball and Counter  - 2 x daily - 7 x weekly - 1 sets - 10 reps - 5 hold - Prone Hip Extension with Plantarflexion  - 1 x daily - 7 x weekly - 2 sets - 10 reps - Prone Knee Flexion AROM  - 1 x daily - 7 x weekly - 2 sets - 10 reps  ASSESSMENT:  CLINICAL IMPRESSION: Lori Klein is making good progress.   She demonstrated significant improvement in L knee flexion today, 105 after manual therapy.  Still has tightness with knee  extension.  Steps were limited more by R knee pain than L knee pain today.  Updated and progressed HEP to continue strengthening.  Lori Klein continues to demonstrate potential for improvement and would benefit from continued skilled therapy to address impairments.    OBJECTIVE IMPAIRMENTS: Abnormal gait, decreased activity tolerance, decreased endurance, decreased mobility, difficulty walking, decreased ROM, decreased strength, hypomobility, increased edema, impaired flexibility, and pain.   ACTIVITY LIMITATIONS: carrying, lifting, bending, standing, squatting, sleeping, stairs, transfers, bed mobility, and locomotion level  PARTICIPATION LIMITATIONS: meal prep, cleaning, laundry, driving, shopping, community activity, occupation, yard work, school, and church  PERSONAL FACTORS: Time since onset of injury/illness/exacerbation and 1 comorbidity: R knee OA  are also affecting patient's functional outcome.   REHAB POTENTIAL: Good  CLINICAL DECISION MAKING: Stable/uncomplicated  EVALUATION COMPLEXITY: Low   GOALS: Goals reviewed with patient? Yes  SHORT TERM GOALS: Target date: 05/11/22  HEP Baseline:established today at eval Goal status: IN PROGRESS   LONG TERM GOALS: Target date: 07/20/22  Tug score less than 14 sec Baseline: 32 sec with r walker Goal status: IN PROGRESS  2.  LEFS improve to 55/80 Baseline: 6/80 Goal status: IN PROGRESS  3.  ROM L knee -5 ext to 115 flexion Baseline: -13 to 75 Goal status: IN PROGRESS  05/06/22: flexion 84, ext -4 05/10/22- 105 flexion  4.  Strength L quads, hamstrings 4+/5 for improved efficiency with gait and transfers Baseline:  3-/5 Goal status: IN PROGRESS   PLAN:  PT FREQUENCY: 2x/week  PT DURATION: 12 weeks  PLANNED INTERVENTIONS: Therapeutic exercises, Therapeutic activity, Neuromuscular re-education, Balance training, Gait training, Patient/Family education, Self Care, and Joint mobilization  PLAN FOR NEXT SESSION:  progress with gait (does better with cane in L hand), additional flexibility and strengthening L LE/knee   Jena Gauss, PT, DPT  05/10/2022, 11:09 AM

## 2022-05-13 ENCOUNTER — Ambulatory Visit: Payer: Federal, State, Local not specified - PPO

## 2022-05-13 ENCOUNTER — Other Ambulatory Visit: Payer: Self-pay

## 2022-05-13 DIAGNOSIS — M25662 Stiffness of left knee, not elsewhere classified: Secondary | ICD-10-CM

## 2022-05-13 DIAGNOSIS — Z96652 Presence of left artificial knee joint: Secondary | ICD-10-CM | POA: Diagnosis not present

## 2022-05-13 DIAGNOSIS — R262 Difficulty in walking, not elsewhere classified: Secondary | ICD-10-CM

## 2022-05-13 DIAGNOSIS — R293 Abnormal posture: Secondary | ICD-10-CM

## 2022-05-13 NOTE — Therapy (Signed)
OUTPATIENT PHYSICAL THERAPY TREATMENT   Patient Name: Lori Klein MRN: [id number] DOB:06/29/1965, 57 y.o., female Today's Date: 05/13/2022  END OF SESSION:  PT End of Session - 05/13/22 1100     Visit Number 5    Date for PT Re-Evaluation 07/20/22    Progress Note Due on Visit 10    PT Start Time 1017    PT Stop Time 1104    PT Time Calculation (min) 47 min    Behavior During Therapy [facility] for tasks assessed/performed              Past Medical History:  Diagnosis Date   Anxiety    Arthritis    Blood dyscrasia    has sickle cell trait   DVT (deep venous thrombosis)    Headache    migraine   PONV (postoperative nausea and vomiting)    Pulmonary embolism    Past Surgical History:  Procedure Laterality Date   CHOLECYSTECTOMY  2008   laparoscopic   IVC FILTER INSERTION N/A 04/12/2022   Procedure: IVC FILTER INSERTION;  Surgeon: Maeola Harman, MD;  Location: [facility] INVASIVE CV LAB;  Service: Cardiovascular;  Laterality: N/A;   IVC FILTER INSERTION Left    IVC VENOGRAPHY N/A 04/12/2022   Procedure: IVC Venography;  Surgeon: Maeola Harman, MD;  Location: [facility] INVASIVE CV LAB;  Service: Cardiovascular;  Laterality: N/A;   KNEE ARTHROPLASTY Left 04/21/2022   Procedure: COMPUTER ASSISTED TOTAL KNEE ARTHROPLASTY;  Surgeon: Samson Frederic, MD;  Location: [facility] ORS;  Service: Orthopedics;  Laterality: Left;   ulnar surgery Left 2017   released compressed nerve   WRIST SURGERY Right    nerve de fragmentation   Patient Active Problem List   Diagnosis Date Noted   Osteoarthritis of left knee 04/21/2022   Bilateral pulmonary embolism 09/27/2015   Acute deep vein thrombosis (DVT) of right lower extremity 09/27/2015    PCP: Tiana Loft  REFERRING PROVIDER: Samson Frederic  REFERRING DIAG: L TKA  THERAPY DIAG:  History of total knee arthroplasty, left  Difficulty in walking, not elsewhere classified  Abnormal posture  Stiffness of left knee,  not elsewhere classified  Rationale for Evaluation and Treatment: Rehabilitation  ONSET DATE: 04/21/22  SUBJECTIVE:   SUBJECTIVE STATEMENT: No pain today, L leg getting stronger PERTINENT HISTORY: Chronic B knee OA, s/p L TKA 04/21/22 PAIN:  Are you having pain? Yes: NPRS scale: 0/10 Pain location: L knee Pain description: pulling, grabbing above L knee cap Aggravating factors: bending Relieving factors: rest elevation  PRECAUTIONS: None  WEIGHT BEARING RESTRICTIONS: No  FALLS:  Has patient fallen in last 6 months? 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  No warmth, drainage, change in skin color noted around aquacel bandage    POSTURE: flexed B knees to 20 degrees and B hips 15  degrees in standing, R knee varum posture  PALPATION: Normal L patellar mobility, mild tissue restriction/ tightness L distal quads   LOWER EXTREMITY ROM:  Active ROM Right eval Left eval Left 05/10/22  Knee flexion 120 75 105  Knee extension -12 -13 -10   (Blank rows = not tested)  LOWER EXTREMITY MMT:  MMT Right eval Left eval  Hip flexion  4-  Hip extension    Hip abduction    Hip adduction    Hip internal rotation    Hip external rotation    Knee flexion  3  Knee extension  3-  Ankle dorsiflexion    Ankle plantarflexion    Ankle inversion     Ankle eversion     (Blank rows = not tested)   FUNCTIONAL TESTS:  Timed up and go (TUG): 32s  GAIT: Distance walked: 9' Assistive device utilized: Environmental consultant - 2 wheeled Level of assistance: Modified independence Comments: very stiff B LE's, maintains B knees locked at 15 to 20 degrees flexion, wide base, leans heavily forward on walker   TODAY'S TREATMENT:                                                                                                                              DATE:  05/13/22 Therapeutic exercise: Recumbent cycle, 5 min rocking, progressing to full revolution Prone for TKE's, 10 reps 5 sec holds bolster under ankles Prone L hip extension with knee extended 15x Prone knee flexion stretch with overpressure from PT with contract/relax 15 reps Forward step ups L foot on reebok step, opposite leg knee drivers. Towel slides, wbing on L, for cw/ccw circles with R LE 15x Forward step ups L foot, followed by lateral heel taps, R LE with L foot on 2" step, for fine motor/eccentric control Ball on wall for TKE L 5 sec holds 10 reps  Vasopneumatic: 10 min game ready, med compression coldest (36 deg),  L LE with L LE elevated for post exercise edema, pain    05/10/22 Therapeutic Exercise: to improve strength and mobility.  Demo, verbal and tactile cues throughout for technique. Nustep L5 x 6 min Step ups (1 riser) 2 x 10 LLE (1 x 10 RLE) 2 UE support.  Knee flexion stretch on step x 10 Step down (no riser) 2 x 10 LLE only Eccentric heel raises 2 x 10 on step  Standing TKE at wall, ball behind knee, with isometric hold 10 x 5 sec Prone hip extension x 10 bil  Prone knee bends 2 x 10 LLE Manual Therapy: to decrease muscle spasm and pain and improve mobility Patellar mobilization, scar mobilization, Prone knee flexion stretch with hip hyperextended for rectus stretch, starting with rotational movements, contract/relax at end range. * Vasopneumatic: 10 min game ready, med  compression coldest (36 deg),  L LE with L LE elevated for post exercise edema, pain  05/06/22: Therapeutic exercise: Nustep level 5, 6 min Seated L knee  flexion slides with pillowcase under L foot, 10 x AROM Seated L knee flexion with therapist applying overpressure, contract /relax with  knee extension resistance, measured at 84 degrees flexion at end of this ex Standing posterior wall leans with heel rocks to cue terminal quads Prone TKE's, B, 5 sec holds, bolster under ankles, 10 reps Prone manually assisted L knee bends, 10 reps, gentle overpressure by PT Standing towel slides with R LE and L LE weight bearing, for/ back, side/side L UE support, 15 reps each encouraged to elongate R posterior leg movement Supine for L post knee capsule stretch for extension with bolster under L ankle x 1 1/2 min,  measured extension -4.  Vasopneumatic:  34 degrees, with L LE elevated, at end of session for edema, pain management   05/03/22 : Therapeutic exercise: Nustep L5x256min Seated heel slides L AROM x 10; x 10 with strap  Seated LAQ L x 10  Standing heel raise  x 10 Standing mini squats x 10  [employer] pews x 20 Retro step x 20 bil QS towel under heel 10x5" SLR L x 10  Gait training: 1490ft with SPC in L hand d/t R knee instability   Vasopneumatic: 10 min game ready L LE with L LE elevated for post exercise edema, pain   PATIENT EDUCATION:  Education details: HEP update Person educated: Patient Education method: Explanation, Demonstration, Tactile cues, Verbal cues, and Handouts Education comprehension: verbalized understanding  HOME EXERCISE PROGRAM: Access Code: [email or web address] URL: [email or web address] Date: 05/10/2022 Prepared by: Harrie ForemanElizabeth Whitman  Exercises - Supine Quad Set  - 2 x daily - 7 x weekly - 1 sets - 10 reps - Active Straight Leg Raise with Quad Set  - 2 x daily - 7 x weekly - 1 sets - 10 reps - Standing Knee Flexion Stretch on Step  - 2 x daily - 7 x weekly -  1 sets - 10 reps - Long Sitting Ankle Plantar Flexion with Resistance  - 2 x daily - 7 x weekly - 1 sets - 10 reps - Prone Quadriceps Set with Towel Roll  - 1 x daily - 7 x weekly - 3 sets - 10 reps - Terminal Knee Extension with Ball and Counter  - 2 x daily - 7 x weekly - 1 sets - 10 reps - 5 hold - Prone Hip Extension with Plantarflexion  - 1 x daily - 7 x weekly - 2 sets - 10 reps - Prone Knee Flexion AROM  - 1 x daily - 7 x weekly - 2 sets - 10 reps  ASSESSMENT:  CLINICAL IMPRESSION: Lori Klein is making excellent progress following her L TKA. Much improved stability, strength l distal knee as well as ongoing improvements in ROM .  R knee and hip flexed much more so now in standing and with gait than L. She continues to benefit from skilled PT to address her goals, progress, to regain hre active lifestyle.    OBJECTIVE IMPAIRMENTS: Abnormal gait, decreased activity tolerance, decreased endurance, decreased mobility, difficulty walking, decreased ROM, decreased strength, hypomobility, increased edema, impaired flexibility, and pain.   ACTIVITY LIMITATIONS: carrying, lifting, bending, standing, squatting, sleeping, stairs, transfers, bed mobility, and locomotion level  PARTICIPATION LIMITATIONS: meal prep, cleaning, laundry, driving, shopping, community activity, occupation, yard work, school, and church  PERSONAL FACTORS: Time since onset of injury/illness/exacerbation and 1 comorbidity: R knee OA  are also affecting patient's functional outcome.   REHAB POTENTIAL: Good  CLINICAL DECISION MAKING: Stable/uncomplicated  EVALUATION COMPLEXITY: Low   GOALS: Goals reviewed with patient? Yes  SHORT TERM GOALS: Target date: 05/11/22  HEP Baseline:established today at eval Goal status: IN PROGRESS   LONG TERM GOALS: Target date: 07/20/22  Tug score less than 14 sec Baseline: 32 sec with r walker Goal status: IN PROGRESS  2.  LEFS improve to 55/80 Baseline: 6/80 Goal  status: IN PROGRESS  3.  ROM L knee -5 ext to 115 flexion Baseline: -13 to 75 Goal status: IN PROGRESS  05/06/22: flexion 84, ext -4 05/10/22- 105 flexion  4.  Strength L quads, hamstrings 4+/5 for improved efficiency with gait and transfers Baseline: 3-/5 Goal status: IN PROGRESS   PLAN:  PT FREQUENCY: 2x/week  PT DURATION: 12 weeks  PLANNED INTERVENTIONS: Therapeutic exercises, Therapeutic activity, Neuromuscular re-education, Balance training, Gait training, Patient/Family education, Self Care, and Joint mobilization  PLAN FOR NEXT SESSION: continue to terminal knee extension flexibility and strength L,   L , PT, DPT  05/13/2022, 12:31 PM

## 2022-05-14 ENCOUNTER — Other Ambulatory Visit (HOSPITAL_BASED_OUTPATIENT_CLINIC_OR_DEPARTMENT_OTHER): Payer: Self-pay

## 2022-05-17 ENCOUNTER — Encounter: Payer: Self-pay | Admitting: Physical Therapy

## 2022-05-17 ENCOUNTER — Ambulatory Visit: Payer: Federal, State, Local not specified - PPO | Admitting: Physical Therapy

## 2022-05-17 DIAGNOSIS — R293 Abnormal posture: Secondary | ICD-10-CM

## 2022-05-17 DIAGNOSIS — Z96652 Presence of left artificial knee joint: Secondary | ICD-10-CM | POA: Diagnosis not present

## 2022-05-17 DIAGNOSIS — M25662 Stiffness of left knee, not elsewhere classified: Secondary | ICD-10-CM

## 2022-05-17 DIAGNOSIS — R262 Difficulty in walking, not elsewhere classified: Secondary | ICD-10-CM

## 2022-05-17 NOTE — Therapy (Signed)
OUTPATIENT PHYSICAL THERAPY TREATMENT   Patient Name: Lori Klein MRN: [id number] DOB:April 03, 1965, 57 y.o., female Today's Date: 05/17/2022  END OF SESSION:  PT End of Session - 05/17/22 1021     Visit Number 6    Date for PT Re-Evaluation 07/20/22    Progress Note Due on Visit 10    PT Start Time 1020    Behavior During Therapy [facility] for tasks assessed/performed              Past Medical History:  Diagnosis Date   Anxiety    Arthritis    Blood dyscrasia    has sickle cell trait   DVT (deep venous thrombosis)    Headache    migraine   PONV (postoperative nausea and vomiting)    Pulmonary embolism    Past Surgical History:  Procedure Laterality Date   CHOLECYSTECTOMY  2008   laparoscopic   IVC FILTER INSERTION N/A 04/12/2022   Procedure: IVC FILTER INSERTION;  Surgeon: Maeola Harman, MD;  Location: [facility] INVASIVE CV LAB;  Service: Cardiovascular;  Laterality: N/A;   IVC FILTER INSERTION Left    IVC VENOGRAPHY N/A 04/12/2022   Procedure: IVC Venography;  Surgeon: Maeola Harman, MD;  Location: [facility] INVASIVE CV LAB;  Service: Cardiovascular;  Laterality: N/A;   KNEE ARTHROPLASTY Left 04/21/2022   Procedure: COMPUTER ASSISTED TOTAL KNEE ARTHROPLASTY;  Surgeon: Samson Frederic, MD;  Location: [facility] ORS;  Service: Orthopedics;  Laterality: Left;   ulnar surgery Left 2017   released compressed nerve   WRIST SURGERY Right    nerve de fragmentation   Patient Active Problem List   Diagnosis Date Noted   Osteoarthritis of left knee 04/21/2022   Bilateral pulmonary embolism 09/27/2015   Acute deep vein thrombosis (DVT) of right lower extremity 09/27/2015    PCP: Tiana Loft  REFERRING PROVIDER: Samson Frederic  REFERRING DIAG: L TKA  THERAPY DIAG:  History of total knee arthroplasty, left  Difficulty in walking, not elsewhere classified  Abnormal posture  Stiffness of left knee, not elsewhere classified  Rationale for Evaluation and  Treatment: Rehabilitation  ONSET DATE: 04/21/22  SUBJECTIVE:   SUBJECTIVE STATEMENT: Able to sleep on side now, starting to walk a little without cane but limping.  Feeling stronger.   PERTINENT HISTORY: Chronic B knee OA, s/p L TKA 04/21/22 PAIN:  Are you having pain? Yes: NPRS scale: 0/10 Pain location: L knee Pain description: pulling, grabbing above L knee cap Aggravating factors: bending Relieving factors: rest elevation  PRECAUTIONS: None  WEIGHT BEARING RESTRICTIONS: No  FALLS:  Has patient fallen in last 6 months? 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  No warmth, drainage, change in skin color noted around aquacel bandage    POSTURE: flexed B knees to 20 degrees and B hips 15 degrees in standing,  R knee varum posture  PALPATION: Normal L patellar mobility, mild tissue restriction/ tightness L distal quads   LOWER EXTREMITY ROM:  Active ROM Right eval Left eval Left 05/10/22 Left 05/17/22  Knee flexion 120 75 105 112 pre/120 post manual  Knee extension -12 -13 -10 -4   (Blank rows = not tested)  LOWER EXTREMITY MMT:  MMT Right eval Left eval  Hip flexion  4-  Hip extension    Hip abduction    Hip adduction    Hip internal rotation    Hip external rotation    Knee flexion  3  Knee extension  3-  Ankle dorsiflexion     Ankle plantarflexion    Ankle inversion    Ankle eversion     (Blank rows = not tested)   FUNCTIONAL TESTS:  Timed up and go (TUG): 32s  GAIT: Distance walked: 5' Assistive device utilized: Environmental consultant - 2 wheeled Level of assistance: Modified independence Comments: very stiff B LE's, maintains B knees locked at 15 to 20 degrees flexion, wide base, leans heavily forward on walker   TODAY'S TREATMENT:                                                                                                                              DATE:   05/17/22 Therapeutic Exercise: to improve strength and mobility.  Demo, verbal and tactile cues throughout for technique. Nustep L5 x 6 min  BATCA knee extension 10# - both knees extending, then lowering weight with LLE only for eccentric strengthening 2 x 10  BATCA knee flexion  15# 2 x 15 bil Manual Therapy: to decrease muscle spasm and pain and improve mobility PA mobs tibia on femur, scar tissue mobilization, IASTM with edge tool to distal quads.  Vasopneumatic: 10 min game ready, med compression coldest (34 deg),  L LE with L LE elevated for post exercise edema, pain    05/13/22 Therapeutic exercise: Recumbent cycle, 5 min rocking, progressing to full revolution Prone for TKE's, 10 reps 5 sec holds bolster under ankles Prone L hip extension with knee extended 15x Prone knee flexion stretch with overpressure from PT with contract/relax 15 reps Forward step ups L foot on reebok step, opposite leg knee drivers. Towel slides, wbing on L, for cw/ccw circles with R LE 15x Forward step ups L foot, followed by lateral heel taps, R LE with L foot on 2" step, for fine motor/eccentric control Ball on wall for TKE L 5 sec holds 10 reps  Vasopneumatic: 10 min game ready, med compression coldest (36 deg),  L LE with L LE elevated for post exercise edema, pain    05/10/22 Therapeutic Exercise: to improve strength and mobility.  Demo, verbal and tactile cues  throughout for technique. Nustep L5 x 6 min Step ups (1 riser) 2 x 10 LLE (1 x 10 RLE) 2 UE support.  Knee flexion stretch on step x 10 Step down (no riser)  2 x 10 LLE only Eccentric heel raises 2 x 10 on step  Standing TKE at wall, ball behind knee, with isometric hold 10 x 5 sec Prone hip extension x 10 bil  Prone knee bends 2 x 10 LLE Manual Therapy: to decrease muscle spasm and pain and improve mobility Patellar mobilization, scar mobilization, Prone knee flexion stretch with hip hyperextended for rectus stretch, starting with rotational movements, contract/relax at end range.  Vasopneumatic: 10 min game ready, med compression coldest (36 deg),  L LE with L LE elevated for post exercise edema, pain    PATIENT EDUCATION:  Education details: continued HEP, use CP PRN Person educated: Patient Education method: Explanation Education comprehension: verbalized understanding  HOME EXERCISE PROGRAM: Access Code: [email or web address] URL: [email or web address] Date: 05/10/2022 Prepared by: Harrie Foreman  Exercises - Supine Quad Set  - 2 x daily - 7 x weekly - 1 sets - 10 reps - Active Straight Leg Raise with Quad Set  - 2 x daily - 7 x weekly - 1 sets - 10 reps - Standing Knee Flexion Stretch on Step  - 2 x daily - 7 x weekly - 1 sets - 10 reps - Long Sitting Ankle Plantar Flexion with Resistance  - 2 x daily - 7 x weekly - 1 sets - 10 reps - Prone Quadriceps Set with Towel Roll  - 1 x daily - 7 x weekly - 3 sets - 10 reps - Terminal Knee Extension with Ball and Counter  - 2 x daily - 7 x weekly - 1 sets - 10 reps - 5 hold - Prone Hip Extension with Plantarflexion  - 1 x daily - 7 x weekly - 2 sets - 10 reps - Prone Knee Flexion AROM  - 1 x daily - 7 x weekly - 2 sets - 10 reps  ASSESSMENT:  CLINICAL IMPRESSION: ELECTA STERRY continues to make good progress, reporting no pain and improving LLE strength.  Her gait is limited more by R knee pain and ROM limitations,  recommended continuing to use her [facility] for now until R knee is replaced, as her gait is much more even with it.  She measured 112 deg L knee flexion pre manual, after manual therapy measured 120 deg flexion, extension only lacking 4 deg today.  SALLY-ANNE WAMBLE continues to demonstrate potential for improvement and would benefit from continued skilled therapy to address impairments.       OBJECTIVE IMPAIRMENTS: Abnormal gait, decreased activity tolerance, decreased endurance, decreased mobility, difficulty walking, decreased ROM, decreased strength, hypomobility, increased edema, impaired flexibility, and pain.   ACTIVITY LIMITATIONS: carrying, lifting, bending, standing, squatting, sleeping, stairs, transfers, bed mobility, and locomotion level  PARTICIPATION LIMITATIONS: meal prep, cleaning, laundry, driving, shopping, community activity, occupation, yard work, school, and church  PERSONAL FACTORS: Time since onset of injury/illness/exacerbation and 1 comorbidity: R knee OA  are also affecting patient's functional outcome.   REHAB POTENTIAL: Good  CLINICAL DECISION MAKING: Stable/uncomplicated  EVALUATION COMPLEXITY: Low   GOALS: Goals reviewed with patient? Yes  SHORT TERM GOALS: Target date: 05/11/22  HEP Baseline:established today at eval Goal status: IN PROGRESS   LONG TERM GOALS: Target date: 07/20/22  Tug score less than 14 sec Baseline: 32 sec with r walker Goal status: IN PROGRESS  2.  LEFS improve to 55/80 Baseline: 6/80 Goal status: IN PROGRESS  3.  ROM L knee -5 ext to 115 flexion Baseline: -13 to 75 Goal status: IN PROGRESS  05/06/22: flexion 84, ext -4  05/10/22- 105 flexion 05/17/22- 4-120 deg.   4.  Strength L quads, hamstrings 4+/5 for improved efficiency with gait and transfers Baseline: 3-/5 Goal status: IN PROGRESS   PLAN:  PT FREQUENCY: 2x/week  PT DURATION: 12 weeks  PLANNED INTERVENTIONS: Therapeutic exercises, Therapeutic activity, Neuromuscular  re-education, Balance training, Gait training, Patient/Family education, Self Care, and Joint mobilization  PLAN FOR NEXT SESSION: continue to terminal knee extension flexibility and strength L.  Monitor ROM but flexion met.   Jena Gauss, PT, DPT  05/17/2022, 10:22 AM

## 2022-05-19 ENCOUNTER — Other Ambulatory Visit (HOSPITAL_BASED_OUTPATIENT_CLINIC_OR_DEPARTMENT_OTHER): Payer: Self-pay

## 2022-05-20 ENCOUNTER — Ambulatory Visit: Payer: Federal, State, Local not specified - PPO

## 2022-05-20 DIAGNOSIS — R262 Difficulty in walking, not elsewhere classified: Secondary | ICD-10-CM

## 2022-05-20 DIAGNOSIS — Z96652 Presence of left artificial knee joint: Secondary | ICD-10-CM | POA: Diagnosis not present

## 2022-05-20 DIAGNOSIS — R293 Abnormal posture: Secondary | ICD-10-CM

## 2022-05-20 DIAGNOSIS — M25662 Stiffness of left knee, not elsewhere classified: Secondary | ICD-10-CM

## 2022-05-20 NOTE — Therapy (Signed)
OUTPATIENT PHYSICAL THERAPY TREATMENT   Patient Name: Lori Klein MRN: [id number] DOB:Sep 10, 1965, 57 y.o., female Today's Date: 05/20/2022  END OF SESSION:  PT End of Session - 05/20/22 1133     Visit Number 7    Date for PT Re-Evaluation 07/20/22    Progress Note Due on Visit 10    PT Start Time 1018    PT Stop Time 1111    PT Time Calculation (min) 53 min    Activity Tolerance Patient tolerated treatment well    Behavior During Therapy [facility] for tasks assessed/performed               Past Medical History:  Diagnosis Date   Anxiety    Arthritis    Blood dyscrasia    has sickle cell trait   DVT (deep venous thrombosis)    Headache    migraine   PONV (postoperative nausea and vomiting)    Pulmonary embolism    Past Surgical History:  Procedure Laterality Date   CHOLECYSTECTOMY  2008   laparoscopic   IVC FILTER INSERTION N/A 04/12/2022   Procedure: IVC FILTER INSERTION;  Surgeon: Maeola Harman, MD;  Location: [facility] INVASIVE CV LAB;  Service: Cardiovascular;  Laterality: N/A;   IVC FILTER INSERTION Left    IVC VENOGRAPHY N/A 04/12/2022   Procedure: IVC Venography;  Surgeon: Maeola Harman, MD;  Location: [facility] INVASIVE CV LAB;  Service: Cardiovascular;  Laterality: N/A;   KNEE ARTHROPLASTY Left 04/21/2022   Procedure: COMPUTER ASSISTED TOTAL KNEE ARTHROPLASTY;  Surgeon: Samson Frederic, MD;  Location: [facility] ORS;  Service: Orthopedics;  Laterality: Left;   ulnar surgery Left 2017   released compressed nerve   WRIST SURGERY Right    nerve de fragmentation   Patient Active Problem List   Diagnosis Date Noted   Osteoarthritis of left knee 04/21/2022   Bilateral pulmonary embolism 09/27/2015   Acute deep vein thrombosis (DVT) of right lower extremity 09/27/2015    PCP: Tiana Loft  REFERRING PROVIDER: Samson Frederic  REFERRING DIAG: L TKA  THERAPY DIAG:  History of total knee arthroplasty, left  Difficulty in walking, not elsewhere  classified  Abnormal posture  Stiffness of left knee, not elsewhere classified  Rationale for Evaluation and Treatment: Rehabilitation  ONSET DATE: 04/21/22  SUBJECTIVE:   SUBJECTIVE STATEMENT: Doing really well. Went back to work this week.  PERTINENT HISTORY: Chronic B knee OA, s/p L TKA 04/21/22 PAIN:  Are you having pain? Yes: NPRS scale: 0/10 Pain location: L knee Pain description: pulling, grabbing above L knee cap Aggravating factors: bending Relieving factors: rest elevation  PRECAUTIONS: None  WEIGHT BEARING RESTRICTIONS: No  FALLS:  Has patient fallen in last 6 months? 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  No warmth, drainage, change in skin color noted around aquacel bandage  POSTURE: flexed B knees to 20 degrees and B hips 15 degrees in standing, R knee varum posture  PALPATION: Normal L patellar mobility, mild tissue restriction/ tightness L distal quads   LOWER EXTREMITY ROM:  Active ROM Right eval Left eval Left 05/10/22 Left 05/17/22  Knee flexion 120 75 105 112 pre/120 post manual  Knee extension -12 -13 -10 -4   (Blank rows = not tested)  LOWER EXTREMITY MMT:  MMT Right eval Left eval  Hip flexion  4-  Hip extension    Hip abduction    Hip adduction    Hip internal rotation    Hip external rotation     Knee flexion  3  Knee extension  3-  Ankle dorsiflexion    Ankle plantarflexion    Ankle inversion    Ankle eversion     (Blank rows = not tested)   FUNCTIONAL TESTS:  Timed up and go (TUG): 32s  GAIT: Distance walked: 69' Assistive device utilized: Environmental consultant - 2 wheeled Level of assistance: Modified independence Comments: very stiff B LE's, maintains B knees locked at 15 to 20 degrees flexion, wide base, leans heavily forward on walker   TODAY'S TREATMENT:                                                                                                                              DATE:  05/20/22 Therapeutic Exercise: to improve strength and mobility.  Demo, verbal and tactile cues throughout for technique. Bike L1x17min Step downs fwd 4' 2x10 Lateral step downs 4' 2x10  Clock balance 5x 1/2 circle pattern L SLS 2x29 sec Leg press 25# 2x10 BLE; 15# 2x10 LLE Knee flexion 10# x 10 B con/ L ecc Knee extension 10# x 10 B con/L ecc  Manual Therapy: to decrease muscle spasm and pain and improve mobility PA mobs tibia on femur, overpressure into knee extension  Vasopneumatic: 10 min game ready, med compression coldest (34 deg),  L LE with L LE elevated for post exercise edema, pain  05/17/22 Therapeutic Exercise: to improve strength and mobility.  Demo, verbal and tactile cues throughout for technique. Nustep L5 x 6 min  BATCA knee extension 10# - both knees extending, then lowering weight with LLE only for eccentric strengthening 2 x 10  BATCA knee flexion  15# 2 x 15 bil Manual Therapy: to decrease muscle spasm and pain and improve mobility PA mobs tibia on femur, scar tissue mobilization, IASTM with edge tool to distal quads.  Vasopneumatic: 10 min game ready, med compression coldest (34 deg),  L LE with L LE elevated for post exercise edema, pain    05/13/22 Therapeutic exercise: Recumbent cycle, 5 min rocking, progressing to full revolution Prone for TKE's, 10 reps 5 sec  holds bolster under ankles Prone L hip extension with knee extended 15x Prone knee flexion stretch with overpressure from PT with contract/relax 15 reps Forward step ups L foot on reebok step, opposite  leg knee drivers. Towel slides, wbing on L, for cw/ccw circles with R LE 15x Forward step ups L foot, followed by lateral heel taps, R LE with L foot on 2" step, for fine motor/eccentric control Ball on wall for TKE L 5 sec holds 10 reps  Vasopneumatic: 10 min game ready, med compression coldest (36 deg),  L LE with L LE elevated for post exercise edema, pain    05/10/22 Therapeutic Exercise: to improve strength and mobility.  Demo, verbal and tactile cues throughout for technique. Nustep L5 x 6 min Step ups (1 riser) 2 x 10 LLE (1 x 10 RLE) 2 UE support.  Knee flexion stretch on step x 10 Step down (no riser) 2 x 10 LLE only Eccentric heel raises 2 x 10 on step  Standing TKE at wall, ball behind knee, with isometric hold 10 x 5 sec Prone hip extension x 10 bil  Prone knee bends 2 x 10 LLE Manual Therapy: to decrease muscle spasm and pain and improve mobility Patellar mobilization, scar mobilization, Prone knee flexion stretch with hip hyperextended for rectus stretch, starting with rotational movements, contract/relax at end range.  Vasopneumatic: 10 min game ready, med compression coldest (36 deg),  L LE with L LE elevated for post exercise edema, pain    PATIENT EDUCATION:  Education details: continued HEP, use CP PRN Person educated: Patient Education method: Explanation Education comprehension: verbalized understanding  HOME EXERCISE PROGRAM: Access Code: [email or web address] URL: [email or web address] Date: 05/10/2022 Prepared by: Harrie Foreman  Exercises - Supine Quad Set  - 2 x daily - 7 x weekly - 1 sets - 10 reps - Active Straight Leg Raise with Quad Set  - 2 x daily - 7 x weekly - 1 sets - 10 reps - Standing Knee Flexion Stretch on Step  - 2 x daily - 7 x weekly  - 1 sets - 10 reps - Long Sitting Ankle Plantar Flexion with Resistance  - 2 x daily - 7 x weekly - 1 sets - 10 reps - Prone Quadriceps Set with Towel Roll  - 1 x daily - 7 x weekly - 3 sets - 10 reps - Terminal Knee Extension with Ball and Counter  - 2 x daily - 7 x weekly - 1 sets - 10 reps - 5 hold - Prone Hip Extension with Plantarflexion  - 1 x daily - 7 x weekly - 2 sets - 10 reps - Prone Knee Flexion AROM  - 1 x daily - 7 x weekly - 2 sets - 10 reps  ASSESSMENT:  CLINICAL IMPRESSION: SHAVONE NEVERS showed good tolerance for exercises today. She does have balance deficits with SLS and clock balance on foam. She continues to walk well with no obvious deviations. Knee ROM has improved 2-120 deg today. Game ready post session to address pain and inflammation.      OBJECTIVE IMPAIRMENTS: Abnormal gait, decreased activity tolerance, decreased endurance, decreased mobility, difficulty walking, decreased ROM, decreased strength, hypomobility, increased edema, impaired flexibility, and pain.   ACTIVITY LIMITATIONS: carrying, lifting, bending, standing, squatting, sleeping, stairs, transfers, bed mobility, and locomotion level  PARTICIPATION LIMITATIONS: meal prep, cleaning, laundry, driving, shopping, community activity, occupation, yard work, school, and church  PERSONAL FACTORS: Time since onset of injury/illness/exacerbation and 1 comorbidity: R knee OA  are also affecting patient's functional outcome.   REHAB POTENTIAL: Good  CLINICAL DECISION MAKING: Stable/uncomplicated  EVALUATION COMPLEXITY: Low   GOALS: Goals reviewed with patient? Yes  SHORT TERM GOALS: Target  date: 05/11/22  HEP Baseline:established today at eval Goal status: IN PROGRESS   LONG TERM GOALS: Target date: 07/20/22  Tug score less than 14 sec Baseline: 32 sec with r walker Goal status: IN PROGRESS  2.  LEFS improve to 55/80 Baseline: 6/80 Goal status: IN PROGRESS  3.  ROM L knee -5 ext to 115  flexion Baseline: -13 to 75 Goal status: IN PROGRESS  05/06/22: flexion 84, ext -4 05/10/22- 105 flexion 05/17/22- 4-120 deg.   4.  Strength L quads, hamstrings 4+/5 for improved efficiency with gait and transfers Baseline: 3-/5 Goal status: IN PROGRESS   PLAN:  PT FREQUENCY: 2x/week  PT DURATION: 12 weeks  PLANNED INTERVENTIONS: Therapeutic exercises, Therapeutic activity, Neuromuscular re-education, Balance training, Gait training, Patient/Family education, Self Care, and Joint mobilization  PLAN FOR NEXT SESSION: continue to terminal knee extension flexibility and strength L.  Monitor ROM but flexion met.   Darleene Cleaver, PTA 05/20/2022, 11:34 AM

## 2022-05-24 ENCOUNTER — Ambulatory Visit: Payer: Federal, State, Local not specified - PPO

## 2022-05-24 DIAGNOSIS — M25662 Stiffness of left knee, not elsewhere classified: Secondary | ICD-10-CM

## 2022-05-24 DIAGNOSIS — Z96652 Presence of left artificial knee joint: Secondary | ICD-10-CM | POA: Diagnosis not present

## 2022-05-24 DIAGNOSIS — R293 Abnormal posture: Secondary | ICD-10-CM

## 2022-05-24 DIAGNOSIS — R262 Difficulty in walking, not elsewhere classified: Secondary | ICD-10-CM

## 2022-05-24 NOTE — Therapy (Signed)
OUTPATIENT PHYSICAL THERAPY TREATMENT   Patient Name: Lori Klein MRN: [id number] DOB:[birth year], 57 y.o., female Today's Date: 05/24/2022  END OF SESSION:  PT End of Session - 05/24/22 1044     Visit Number 8    Date for PT Re-Evaluation 07/20/22    Progress Note Due on Visit 10    PT Start Time 1017    PT Stop Time 1110    PT Time Calculation (min) 53 min    Activity Tolerance Patient tolerated treatment well    Behavior During Therapy [facility] for tasks assessed/performed               Past Medical History:  Diagnosis Date   Anxiety    Arthritis    Blood dyscrasia    has sickle cell trait   DVT (deep venous thrombosis)    Headache    migraine   PONV (postoperative nausea and vomiting)    Pulmonary embolism    Past Surgical History:  Procedure Laterality Date   CHOLECYSTECTOMY  2008   laparoscopic   IVC FILTER INSERTION N/A 04/12/2022   Procedure: IVC FILTER INSERTION;  Surgeon: Maeola Harman, MD;  Location: [facility] INVASIVE CV LAB;  Service: Cardiovascular;  Laterality: N/A;   IVC FILTER INSERTION Left    IVC VENOGRAPHY N/A 04/12/2022   Procedure: IVC Venography;  Surgeon: Maeola Harman, MD;  Location: [facility] INVASIVE CV LAB;  Service: Cardiovascular;  Laterality: N/A;   KNEE ARTHROPLASTY Left 04/21/2022   Procedure: COMPUTER ASSISTED TOTAL KNEE ARTHROPLASTY;  Surgeon: Samson Frederic, MD;  Location: [facility];  Service: Orthopedics;  Laterality: Left;   ulnar surgery Left 2017   released compressed nerve   WRIST SURGERY Right    nerve de fragmentation   Patient Active Problem List   Diagnosis Date Noted   Osteoarthritis of left knee 04/21/2022   Bilateral pulmonary embolism 09/27/2015   Acute deep vein thrombosis (DVT) of right lower extremity 09/27/2015    PCP: Tiana Loft  REFERRING PROVIDER: Samson Frederic  REFERRING DIAG: L TKA  THERAPY DIAG:  History of total knee arthroplasty, left  Difficulty in walking, not elsewhere  classified  Abnormal posture  Stiffness of left knee, not elsewhere classified  Rationale for Evaluation and Treatment: Rehabilitation  ONSET DATE: 04/21/22  SUBJECTIVE:   SUBJECTIVE STATEMENT: Doing real well, going back to regular work hours soon.  PERTINENT HISTORY: Chronic B knee OA, s/p L TKA 04/21/22 PAIN:  Are you having pain? Yes: NPRS scale: 0/10 Pain location: L knee Pain description: pulling, grabbing above L knee cap Aggravating factors: bending Relieving factors: rest elevation  PRECAUTIONS: None  WEIGHT BEARING RESTRICTIONS: No  FALLS:  Has patient fallen in last 6 months? 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  No warmth, drainage, change in skin color noted around aquacel bandage  POSTURE: flexed B knees to 20 degrees and B hips 15 degrees in standing, R knee varum posture  PALPATION: Normal L patellar mobility, mild tissue restriction/ tightness L distal quads   LOWER EXTREMITY ROM:  Active ROM Right eval Left eval Left 05/10/22 Left 05/17/22 Left 05/24/22  Knee flexion 120 75 105 112 pre/120 post manual   Knee extension -12 -13 -10 -4 0   (Blank rows = not tested)  LOWER EXTREMITY MMT:  MMT Right eval Left eval  Hip flexion  4-  Hip extension    Hip abduction    Hip adduction    Hip internal rotation     Hip external rotation    Knee flexion  3  Knee extension  3-  Ankle dorsiflexion    Ankle plantarflexion    Ankle inversion    Ankle eversion     (Blank rows = not tested)   FUNCTIONAL TESTS:  Timed up and go (TUG): 32s  GAIT: Distance walked: 22' Assistive device utilized: Environmental consultant - 2 wheeled Level of assistance: Modified independence Comments: very stiff B LE's, maintains B knees locked at 15 to 20 degrees flexion, wide base, leans heavily forward on walker   TODAY'S TREATMENT:                                                                                                                              DATE:  05/24/22 Therapeutic Exercise: to improve strength and mobility.  Demo, verbal and tactile cues throughout for technique. Stairs: 14 x 3 up/down with 1 HR - decreased eccentric control on R knee Knee flexion 10# x 10 B LE; 5# x 10 R/L Knee extension 10# 2x10; 5# x 10 R/L-- 2nd set 10# x 10 Runner stretch 2x30 sec for L gastroc  NEUROMUSCULAR RE-EDUCATION: To improve posture, balance, and kinesthesia. Ball toss with L SLS x 10  Clock balance 5x 1/2 circle pattern standing on foam oval R single leg RDL x 10 with UE support; x 10 w/o support- more challenged d/t balance R/L hip hikes on treadmill x 20 with UE support  Manual Therapy: to decrease muscle spasm and pain and improve mobility PA mobs tibia on femur, overpressure into knee extension  Vasopneumatic: 10 min game ready, low compression coldest (34 deg),  L LE with L LE elevated for post exercise edema, pain  05/20/22 Therapeutic Exercise: to improve strength and mobility.  Demo, verbal and tactile cues throughout for technique. Bike L1x62min Step downs fwd 4' 2x10 Lateral step downs 4' 2x10  Clock balance 5x 1/2 circle pattern L SLS 2x29 sec Leg press 25# 2x10 BLE; 15# 2x10 LLE Knee flexion 10# x 10 B con/ L ecc Knee extension 10# x 10 B con/L ecc  Manual Therapy: to decrease muscle spasm and pain and  improve mobility PA mobs tibia on femur, overpressure into knee extension  Vasopneumatic: 10 min game ready, med compression coldest (34 deg),  L LE with  L LE elevated for post exercise edema, pain  05/17/22 Therapeutic Exercise: to improve strength and mobility.  Demo, verbal and tactile cues throughout for technique. Nustep L5 x 6 min  BATCA knee extension 10# - both knees extending, then lowering weight with LLE only for eccentric strengthening 2 x 10  BATCA knee flexion  15# 2 x 15 bil Manual Therapy: to decrease muscle spasm and pain and improve mobility PA mobs tibia on femur, scar tissue mobilization, IASTM with edge tool to distal quads.  Vasopneumatic: 10 min game ready, med compression coldest (34 deg),  L LE with L LE elevated for post exercise edema, pain    05/13/22 Therapeutic exercise: Recumbent cycle, 5 min rocking, progressing to full revolution Prone for TKE's, 10 reps 5 sec holds bolster under ankles Prone L hip extension with knee extended 15x Prone knee flexion stretch with overpressure from PT with contract/relax 15 reps Forward step ups L foot on reebok step, opposite leg knee drivers. Towel slides, wbing on L, for cw/ccw circles with R LE 15x Forward step ups L foot, followed by lateral heel taps, R LE with L foot on 2" step, for fine motor/eccentric control Ball on wall for TKE L 5 sec holds 10 reps  Vasopneumatic: 10 min game ready, med compression coldest (36 deg),  L LE with L LE elevated for post exercise edema, pain    05/10/22 Therapeutic Exercise: to improve strength and mobility.  Demo, verbal and tactile cues throughout for technique. Nustep L5 x 6 min Step ups (1 riser) 2 x 10 LLE (1 x 10 RLE) 2 UE support.  Knee flexion stretch on step x 10 Step down (no riser) 2 x 10 LLE only Eccentric heel raises 2 x 10 on step  Standing TKE at wall, ball behind knee, with isometric hold 10 x 5 sec Prone hip extension x 10 bil  Prone knee bends 2 x 10  LLE Manual Therapy: to decrease muscle spasm and pain and improve mobility Patellar mobilization, scar mobilization, Prone knee flexion stretch with hip hyperextended for rectus stretch, starting with rotational movements, contract/relax at end range.  Vasopneumatic: 10 min game ready, med compression coldest (36 deg),  L LE with L LE elevated for post exercise edema, pain    PATIENT EDUCATION:  Education details: continued HEP, use CP PRN Person educated: Patient Education method: Explanation Education comprehension: verbalized understanding  HOME EXERCISE PROGRAM: Access Code: [email or web address] URL: [email or web address] Date: 05/10/2022 Prepared by: Harrie Foreman  Exercises - Supine Quad Set  - 2 x daily - 7 x weekly - 1 sets - 10 reps - Active Straight Leg Raise with Quad Set  - 2 x daily - 7 x weekly - 1 sets - 10 reps - Standing Knee Flexion Stretch on Step  - 2 x daily - 7 x weekly - 1 sets - 10 reps - Long Sitting Ankle Plantar Flexion with Resistance  - 2 x daily - 7 x weekly - 1 sets - 10 reps - Prone Quadriceps Set with Towel Roll  - 1 x daily - 7 x weekly - 3 sets - 10 reps - Terminal Knee Extension with Ball and Counter  - 2 x daily - 7 x weekly - 1 sets - 10 reps - 5 hold - Prone Hip Extension with Plantarflexion  - 1 x daily - 7 x weekly - 2 sets - 10 reps - Prone Knee Flexion AROM  - 1 x daily - 7 x weekly - 2  sets - 10 reps  ASSESSMENT:  CLINICAL IMPRESSION: Lori Klein showed good tolerance for exercises today. We continued working on balance activities to improve stability and kinesthetic awareness on L knee. We also progressed strengthening for L knee with weight machines. She shows full knee extension ROM today. Game ready post session to address edema and pain.     OBJECTIVE IMPAIRMENTS: Abnormal gait, decreased activity tolerance, decreased endurance, decreased mobility, difficulty walking, decreased ROM, decreased strength, hypomobility,  increased edema, impaired flexibility, and pain.   ACTIVITY LIMITATIONS: carrying, lifting, bending, standing, squatting, sleeping, stairs, transfers, bed mobility, and locomotion level  PARTICIPATION LIMITATIONS: meal prep, cleaning, laundry, driving, shopping, community activity, occupation, yard work, school, and church  PERSONAL FACTORS: Time since onset of injury/illness/exacerbation and 1 comorbidity: R knee OA  are also affecting patient's functional outcome.   REHAB POTENTIAL: Good  CLINICAL DECISION MAKING: Stable/uncomplicated  EVALUATION COMPLEXITY: Low   GOALS: Goals reviewed with patient? Yes  SHORT TERM GOALS: Target date: 05/11/22  HEP Baseline:established today at eval Goal status: IN PROGRESS   LONG TERM GOALS: Target date: 07/20/22  Tug score less than 14 sec Baseline: 32 sec with r walker Goal status: IN PROGRESS  2.  LEFS improve to 55/80 Baseline: 6/80 Goal status: IN PROGRESS  3.  ROM L knee -5 ext to 115 flexion Baseline: -13 to 75 Goal status: IN PROGRESS  05/06/22: flexion 84, ext -4 05/10/22- 105 flexion 05/17/22- 4-120 deg.   4.  Strength L quads, hamstrings 4+/5 for improved efficiency with gait and transfers Baseline: 3-/5 Goal status: IN PROGRESS   PLAN:  PT FREQUENCY: 2x/week  PT DURATION: 12 weeks  PLANNED INTERVENTIONS: Therapeutic exercises, Therapeutic activity, Neuromuscular re-education, Balance training, Gait training, Patient/Family education, Self Care, and Joint mobilization  PLAN FOR NEXT SESSION: continue to terminal knee extension flexibility and strength L.  Monitor ROM but flexion met.   Darleene Cleaver, PTA 05/24/2022, 11:04 AM

## 2022-05-27 ENCOUNTER — Ambulatory Visit: Payer: Federal, State, Local not specified - PPO

## 2022-05-27 DIAGNOSIS — M25662 Stiffness of left knee, not elsewhere classified: Secondary | ICD-10-CM

## 2022-05-27 DIAGNOSIS — Z96652 Presence of left artificial knee joint: Secondary | ICD-10-CM

## 2022-05-27 DIAGNOSIS — R293 Abnormal posture: Secondary | ICD-10-CM

## 2022-05-27 DIAGNOSIS — R262 Difficulty in walking, not elsewhere classified: Secondary | ICD-10-CM

## 2022-05-27 NOTE — Therapy (Signed)
OUTPATIENT PHYSICAL THERAPY TREATMENT   Patient Name: Lori Klein MRN: [id number] DOB:March 03, 1965, 57 y.o., female Today's Date: 05/27/2022  END OF SESSION:  PT End of Session - 05/27/22 1540     Visit Number 9    Date for PT Re-Evaluation 07/20/22    Progress Note Due on Visit 10    PT Start Time 1534    PT Stop Time 1626    PT Time Calculation (min) 52 min    Activity Tolerance Patient tolerated treatment well    Behavior During Therapy WFL for tasks assessed/performed                Past Medical History:  Diagnosis Date   Anxiety    Arthritis    Blood dyscrasia    has sickle cell trait   DVT (deep venous thrombosis)    Headache    migraine   PONV (postoperative nausea and vomiting)    Pulmonary embolism    Past Surgical History:  Procedure Laterality Date   CHOLECYSTECTOMY  2008   laparoscopic   IVC FILTER INSERTION N/A 04/12/2022   Procedure: IVC FILTER INSERTION;  Surgeon: Maeola Harman, MD;  Location: [facility] INVASIVE CV LAB;  Service: Cardiovascular;  Laterality: N/A;   IVC FILTER INSERTION Left    IVC VENOGRAPHY N/A 04/12/2022   Procedure: IVC Venography;  Surgeon: Maeola Harman, MD;  Location: [facility] INVASIVE CV LAB;  Service: Cardiovascular;  Laterality: N/A;   KNEE ARTHROPLASTY Left 04/21/2022   Procedure: COMPUTER ASSISTED TOTAL KNEE ARTHROPLASTY;  Surgeon: Samson Frederic, MD;  Location: [facility];  Service: Orthopedics;  Laterality: Left;   ulnar surgery Left 2017   released compressed nerve   WRIST SURGERY Right    nerve de fragmentation   Patient Active Problem List   Diagnosis Date Noted   Osteoarthritis of left knee 04/21/2022   Bilateral pulmonary embolism 09/27/2015   Acute deep vein thrombosis (DVT) of right lower extremity 09/27/2015    PCP: Tiana Loft  REFERRING PROVIDER: Samson Frederic  REFERRING DIAG: L TKA  THERAPY DIAG:  History of total knee arthroplasty, left  Difficulty in walking, not  elsewhere classified  Abnormal posture  Stiffness of left knee, not elsewhere classified  Rationale for Evaluation and Treatment: Rehabilitation  ONSET DATE: 04/21/22  SUBJECTIVE:   SUBJECTIVE STATEMENT: No new complaints.  PERTINENT HISTORY: Chronic B knee OA, s/p L TKA 04/21/22 PAIN:  Are you having pain? No  PRECAUTIONS: None  WEIGHT BEARING RESTRICTIONS: No  FALLS:  Has patient fallen in last 6 months? 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  No warmth, drainage, change in skin color noted around aquacel bandage    POSTURE: flexed B knees to 20 degrees and B hips 15 degrees in standing, R knee varum posture  PALPATION: Normal L patellar mobility, mild  tissue restriction/ tightness L distal quads   LOWER EXTREMITY ROM:  Active ROM Right eval Left eval Left 05/10/22 Left 05/17/22 Left 05/24/22  Knee flexion 120 75 105 112 pre/120 post manual   Knee extension -12 -13 -10 -4 0   (Blank rows = not tested)  LOWER EXTREMITY MMT:  MMT Right eval Left eval Left 05/27/22  Hip flexion  4- 4+  Hip extension     Hip abduction     Hip adduction     Hip internal rotation     Hip external rotation     Knee flexion  3 4+  Knee extension  3- 4  Ankle dorsiflexion     Ankle plantarflexion     Ankle inversion     Ankle eversion       (Blank rows = not tested)   FUNCTIONAL TESTS:  Timed up and go (TUG): 32s  GAIT: Distance walked: 98' Assistive device utilized: Environmental consultant - 2 wheeled Level of assistance: Modified independence Comments: very stiff B LE's, maintains B knees locked at 15 to 20 degrees flexion, wide base, leans heavily forward on walker   TODAY'S TREATMENT:                                                                                                                              DATE:  05/27/22 Therapeutic Exercise: to improve strength and mobility.  Demo, verbal and tactile cues throughout for technique. Nustep L5x30min BLE only Standing squats 2x10 Single leg dead lift 2x10 L no UE support; R with UE support Retro step with head turns x 12  Single leg squat 2x10 L - pain on R  Leg press 35# BLE 2x10; 15# R/L 2x10 Knee extension 10# B con/L ecc x 15  Cp to L knee x 10 min 05/24/22 Therapeutic Exercise: to improve  strength and mobility.  Demo, verbal and tactile cues throughout for technique. Stairs: 14 x 3 up/down with 1 HR - decreased eccentric control on R knee Knee flexion 10# x 10 B LE; 5# x 10 R/L Knee extension 10# 2x10; 5# x 10 R/L-- 2nd set 10# x 10 Runner stretch 2x30 sec for L gastroc  NEUROMUSCULAR RE-EDUCATION: To improve posture, balance, and kinesthesia. Ball toss with L SLS x 10  Clock balance 5x 1/2 circle pattern standing on foam oval R single leg RDL x 10 with UE support; x 10 w/o support- more challenged d/t balance R/L hip hikes on treadmill x 20 with UE support  Manual Therapy: to decrease muscle spasm and pain and improve mobility PA mobs tibia on femur, overpressure into knee extension  Vasopneumatic: 10 min game ready, low compression coldest (34 deg),  L LE with L LE elevated for post exercise edema, pain  05/20/22 Therapeutic Exercise: to improve strength and mobility.  Demo, verbal and tactile cues throughout for technique. Bike L1x21min Step downs fwd 4'  2x10 Lateral step downs 4' 2x10  Clock balance  5x 1/2 circle pattern L SLS 2x29 sec Leg press 25# 2x10 BLE; 15# 2x10 LLE Knee flexion 10# x 10 B con/ L ecc Knee extension 10# x 10 B con/L ecc  Manual Therapy: to decrease muscle spasm and pain and improve mobility PA mobs tibia on femur, overpressure into knee extension  Vasopneumatic: 10 min game ready, med compression coldest (34 deg),  L LE with L LE elevated for post exercise edema, pain  05/17/22 Therapeutic Exercise: to improve strength and mobility.  Demo, verbal and tactile cues throughout for technique. Nustep L5 x 6 min  BATCA knee extension 10# - both knees extending, then lowering weight with LLE only for eccentric strengthening 2 x 10  BATCA knee flexion  15# 2 x 15 bil Manual Therapy: to decrease muscle spasm and pain and improve mobility PA mobs tibia on femur, scar tissue mobilization, IASTM with edge tool to distal quads.  Vasopneumatic: 10 min game ready, med compression coldest (34 deg),  L LE with L LE elevated for post exercise edema, pain    05/13/22 Therapeutic exercise: Recumbent cycle, 5 min rocking, progressing to full revolution Prone for TKE's, 10 reps 5 sec holds bolster under ankles Prone L hip extension with knee extended 15x Prone knee flexion stretch with overpressure from PT with contract/relax 15 reps Forward step ups L foot on reebok step, opposite leg knee drivers. Towel slides, wbing on L, for cw/ccw circles with R LE 15x Forward step ups L foot, followed by lateral heel taps, R LE with L foot on 2" step, for fine motor/eccentric control Ball on wall for TKE L 5 sec holds 10 reps  Vasopneumatic: 10 min game ready, med compression coldest (36 deg),  L LE with L LE elevated for post exercise edema, pain    05/10/22 Therapeutic Exercise: to improve strength and mobility.  Demo, verbal and tactile cues throughout for technique. Nustep L5 x 6 min Step ups (1 riser) 2 x 10 LLE (1 x 10 RLE) 2 UE  support.  Knee flexion stretch on step x 10 Step down (no riser) 2 x 10 LLE only Eccentric heel raises 2 x 10 on step  Standing TKE at wall, ball behind knee, with isometric hold 10 x 5 sec Prone hip extension x 10 bil  Prone knee bends 2 x 10 LLE Manual Therapy: to decrease muscle spasm and pain and improve mobility Patellar mobilization, scar mobilization, Prone knee flexion stretch with hip hyperextended for rectus stretch, starting with rotational movements, contract/relax at end range.  Vasopneumatic: 10 min game ready, med compression coldest (36 deg),  L LE with L LE elevated for post exercise edema, pain    PATIENT EDUCATION:  Education details: continued HEP, use CP PRN Person educated: Patient Education method: Explanation Education comprehension: verbalized understanding  HOME EXERCISE PROGRAM: Access Code: [email or web address] URL: [email or web address] Date: 05/27/2022 Prepared by: Verta Ellen  Exercises - Active Straight Leg Raise with Quad Set  - 2 x daily - 7 x weekly - 1 sets - 10 reps - Standing Knee Flexion Stretch on Step  - 2 x daily - 7 x weekly - 1 sets - 10 reps - Terminal Knee Extension with Ball and Counter  - 2 x daily - 7 x weekly - 1 sets - 10 reps - 5 hold - Squat  - 1 x daily - 7 x weekly - 3 sets - 10 reps - Single Leg Squat with Resistance Loop  - 1 x daily - 7 x  weekly - 3 sets - 10 reps - Forward T  - 1 x daily - 7 x weekly - 3 sets - 10 reps  ASSESSMENT:  CLINICAL IMPRESSION: ALEE GRESSMAN showed good tolerance for exercises today. We continued working on neuromuscular facilitation for balance and control. Updated HEP as able to progress exercises. She continues to have more difficulty with exercises and pain on R knee rather than L. CP applied to knee post session for swelling. Progressing with L knee strength.      OBJECTIVE IMPAIRMENTS: Abnormal gait, decreased activity tolerance, decreased endurance, decreased mobility, difficulty  walking, decreased ROM, decreased strength, hypomobility, increased edema, impaired flexibility, and pain.   ACTIVITY LIMITATIONS: carrying, lifting, bending, standing, squatting, sleeping, stairs, transfers, bed mobility, and locomotion level  PARTICIPATION LIMITATIONS: meal prep, cleaning, laundry, driving, shopping, community activity, occupation, yard work, school, and church  PERSONAL FACTORS: Time since onset of injury/illness/exacerbation and 1 comorbidity: R knee OA  are also affecting patient's functional outcome.   REHAB POTENTIAL: Good  CLINICAL DECISION MAKING: Stable/uncomplicated  EVALUATION COMPLEXITY: Low   GOALS: Goals reviewed with patient? Yes  SHORT TERM GOALS: Target date: 05/11/22  HEP Baseline:established today at eval Goal status: IN PROGRESS   LONG TERM GOALS: Target date: 07/20/22  Tug score less than 14 sec Baseline: 32 sec with r walker Goal status: IN PROGRESS  2.  LEFS improve to 55/80 Baseline: 6/80 Goal status: IN PROGRESS  3.  ROM L knee -5 ext to 115 flexion Baseline: -13 to 75 Goal status: IN PROGRESS  05/06/22: flexion 84, ext -4 05/10/22- 105 flexion 05/17/22- 4-120 deg.   4.  Strength L quads, hamstrings 4+/5 for improved efficiency with gait and transfers Baseline: 3-/5 Goal status: IN PROGRESS- 05/27/22 see chart   PLAN:  PT FREQUENCY: 2x/week  PT DURATION: 12 weeks  PLANNED INTERVENTIONS: Therapeutic exercises, Therapeutic activity, Neuromuscular re-education, Balance training, Gait training, Patient/Family education, Self Care, and Joint mobilization  PLAN FOR NEXT SESSION: continue to terminal knee extension flexibility and strength L.  Monitor ROM but flexion met.   Darleene Cleaver, PTA 05/27/2022, 4:19 PM

## 2022-05-31 ENCOUNTER — Ambulatory Visit: Payer: Federal, State, Local not specified - PPO

## 2022-05-31 DIAGNOSIS — Z96652 Presence of left artificial knee joint: Secondary | ICD-10-CM | POA: Diagnosis not present

## 2022-05-31 DIAGNOSIS — R262 Difficulty in walking, not elsewhere classified: Secondary | ICD-10-CM

## 2022-05-31 DIAGNOSIS — M25662 Stiffness of left knee, not elsewhere classified: Secondary | ICD-10-CM

## 2022-05-31 DIAGNOSIS — R293 Abnormal posture: Secondary | ICD-10-CM

## 2022-05-31 NOTE — Therapy (Signed)
OUTPATIENT PHYSICAL THERAPY TREATMENT   Patient Name: Lori KOSLOSKI MRN: [id number] DOB:1965-08-12, 57 y.o., female Today's Date: 05/31/2022  END OF SESSION:  PT End of Session - 05/31/22 1021     Visit Number 10    Date for PT Re-Evaluation 07/20/22    Progress Note Due on Visit 10    PT Start Time 1018    PT Stop Time 1110    PT Time Calculation (min) 52 min    Activity Tolerance Patient tolerated treatment well    Behavior During Therapy WFL for tasks assessed/performed                 Past Medical History:  Diagnosis Date   Anxiety    Arthritis    Blood dyscrasia    has sickle cell trait   DVT (deep venous thrombosis) (HCC)    Headache    migraine   PONV (postoperative nausea and vomiting)    Pulmonary embolism (HCC)    Past Surgical History:  Procedure Laterality Date   CHOLECYSTECTOMY  2008   laparoscopic   IVC FILTER INSERTION N/A 04/12/2022   Procedure: IVC FILTER INSERTION;  Surgeon: Maeola Harman, MD;  Location: [facility] INVASIVE CV LAB;  Service: Cardiovascular;  Laterality: N/A;   IVC FILTER INSERTION Left    IVC VENOGRAPHY N/A 04/12/2022   Procedure: IVC Venography;  Surgeon: Maeola Harman, MD;  Location: [facility] INVASIVE CV LAB;  Service: Cardiovascular;  Laterality: N/A;   KNEE ARTHROPLASTY Left 04/21/2022   Procedure: COMPUTER ASSISTED TOTAL KNEE ARTHROPLASTY;  Surgeon: Samson Frederic, MD;  Location: [facility];  Service: Orthopedics;  Laterality: Left;   ulnar surgery Left 2017   released compressed nerve   WRIST SURGERY Right    nerve de fragmentation   Patient Active Problem List   Diagnosis Date Noted   Osteoarthritis of left knee 04/21/2022   Bilateral pulmonary embolism (HCC) 09/27/2015   Acute deep vein thrombosis (DVT) of right lower extremity (HCC) 09/27/2015    PCP: Tiana Loft  REFERRING PROVIDER: Samson Frederic  REFERRING DIAG: L TKA  THERAPY DIAG:  History of total knee arthroplasty,  left  Difficulty in walking, not elsewhere classified  Abnormal posture  Stiffness of left knee, not elsewhere classified  Rationale for Evaluation and Treatment: Rehabilitation  ONSET DATE: 04/21/22  SUBJECTIVE:   SUBJECTIVE STATEMENT: No new complaints.  PERTINENT HISTORY: Chronic B knee OA, s/p L TKA 04/21/22 PAIN:  Are you having pain? No  PRECAUTIONS: None  WEIGHT BEARING RESTRICTIONS: No  FALLS:  Has patient fallen in last 6 months? 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  No warmth, drainage, change in skin color noted around aquacel bandage    POSTURE: flexed B knees to 20 degrees and B hips 15 degrees in standing, R knee varum posture  PALPATION:  Normal L patellar mobility, mild tissue restriction/ tightness L distal quads   LOWER EXTREMITY ROM:  Active ROM Right eval Left eval Left 05/10/22 Left 05/17/22 Left 05/24/22  Knee flexion 120 75 105 112 pre/120 post manual   Knee extension -12 -13 -10 -4 0   (Blank rows = not tested)  LOWER EXTREMITY MMT:  MMT Right eval Left eval Left 05/27/22  Hip flexion  4- 4+  Hip extension     Hip abduction     Hip adduction     Hip internal rotation     Hip external rotation     Knee flexion  3 4+  Knee extension  3- 4  Ankle dorsiflexion     Ankle plantarflexion     Ankle  inversion     Ankle eversion      (Blank rows = not tested)   FUNCTIONAL TESTS:  Timed up and go (TUG): 32s  GAIT: Distance walked: 49' Assistive device utilized: Environmental consultant - 2 wheeled Level of assistance: Modified independence Comments: very stiff B LE's, maintains B knees locked at 15 to 20 degrees flexion, wide base, leans heavily forward on walker   TODAY'S TREATMENT:                                                                                                                              DATE:  05/31/22 Therapeutic Exercise: to improve strength and mobility.  Demo, verbal and tactile cues throughout for technique. Elliptical 4 min L1.0  Single leg dead lift 2x10 both with UE support Knee flexion 20# 2 x 10 B LE; 5# 2 x 10 R/L Knee extension 20# BLE; R/L  10# 2x10 TUG: 8 seconds no AD Lower Extremity Functional Score: 55 / 80 = 68.8 % Vasopneumatic: 10 min game ready, low compression coldest (34 deg),  L LE with L LE elevated for post exercise edema, pain  05/27/22 Therapeutic Exercise: to improve strength and mobility.  Demo, verbal and tactile cues throughout for technique. Nustep L5x12min BLE only Standing squats 2x10 Single leg dead lift 2x10 L no UE support; R with UE support Retro step with head turns x 12  Single leg squat 2x10 L - pain on R  Leg press 35# BLE 2x10; 15# R/L 2x10 Knee extension 10# B con/L ecc x 15  Cp to L knee x 10 min 05/24/22 Therapeutic Exercise: to improve  strength and mobility.  Demo, verbal and tactile cues throughout for technique. Stairs: 14 x 3 up/down with 1 HR - decreased eccentric control on R knee Knee flexion 10# x 10 B LE; 5# x 10 R/L Knee extension 10# 2x10; 5# x 10 R/L-- 2nd set 10# x 10 Runner stretch 2x30 sec for L gastroc  NEUROMUSCULAR RE-EDUCATION: To improve posture, balance, and kinesthesia. Ball toss with L SLS x 10  Clock balance 5x 1/2 circle pattern standing on foam oval R single leg RDL x 10 with UE support; x  10  w/o support- more challenged d/t balance R/L hip hikes on treadmill x 20 with UE support  Manual Therapy: to decrease muscle spasm and pain and improve mobility PA mobs tibia on femur, overpressure into knee extension  Vasopneumatic: 10 min game ready, low compression coldest (34 deg),  L LE with L LE elevated for post exercise edema, pain  05/20/22 Therapeutic Exercise: to improve strength and mobility.  Demo, verbal and tactile cues throughout for technique. Bike L1x35min Step downs fwd 4' 2x10 Lateral step downs 4' 2x10  Clock balance 5x 1/2 circle pattern L SLS 2x29 sec Leg press 25# 2x10 BLE; 15# 2x10 LLE Knee flexion 10# x 10 B con/ L ecc Knee extension 10# x 10 B con/L ecc  Manual Therapy: to decrease muscle spasm and pain and improve mobility PA mobs tibia on femur, overpressure into knee extension  Vasopneumatic: 10 min game ready, med compression coldest (34 deg),  L LE with L LE elevated for post exercise edema, pain  05/17/22 Therapeutic Exercise: to improve strength and mobility.  Demo, verbal and tactile cues throughout for technique. Nustep L5 x 6 min  BATCA knee extension 10# - both knees extending, then lowering weight with LLE only for eccentric strengthening 2 x 10  BATCA knee flexion  15# 2 x 15 bil Manual Therapy: to decrease muscle spasm and pain and improve mobility PA mobs tibia on femur, scar tissue mobilization, IASTM with edge tool to distal quads.  Vasopneumatic: 10 min game ready, med compression coldest (34 deg),  L LE with L LE elevated for post exercise edema, pain    05/13/22 Therapeutic exercise: Recumbent cycle, 5 min rocking, progressing to full revolution Prone for TKE's, 10 reps 5 sec holds bolster under ankles Prone L hip extension with knee extended 15x Prone knee flexion stretch with overpressure from PT with contract/relax 15 reps Forward step ups L foot on reebok step, opposite leg knee drivers. Towel slides, wbing on L, for cw/ccw  circles with R LE 15x Forward step ups L foot, followed by lateral heel taps, R LE with L foot on 2" step, for fine motor/eccentric control Ball on wall for TKE L 5 sec holds 10 reps  Vasopneumatic: 10 min game ready, med compression coldest (36 deg),  L LE with L LE elevated for post exercise edema, pain    05/10/22 Therapeutic Exercise: to improve strength and mobility.  Demo, verbal and tactile cues throughout for technique. Nustep L5 x 6 min Step ups (1 riser) 2 x 10 LLE (1 x 10 RLE) 2 UE support.  Knee flexion stretch on step x 10 Step down (no riser) 2 x 10 LLE only Eccentric heel raises 2 x 10 on step  Standing TKE at wall, ball behind knee, with isometric hold 10 x 5 sec Prone hip extension x 10 bil  Prone knee bends 2 x 10 LLE Manual Therapy: to decrease muscle spasm and pain and improve mobility Patellar mobilization, scar mobilization, Prone knee flexion stretch with hip hyperextended for rectus stretch, starting with rotational movements, contract/relax at end range.  Vasopneumatic: 10 min game ready, med compression coldest (36 deg),  L LE with L LE elevated for post exercise edema, pain    PATIENT EDUCATION:  Education details: continued HEP, use CP PRN Person educated: Patient Education method: Explanation Education comprehension: verbalized understanding  HOME EXERCISE PROGRAM: Access Code: [email or web address] URL: [email or web address] Date: 05/27/2022 Prepared by: Verta Ellen  Exercises - Active Straight Leg Raise with Quad Set  -  2 x daily - 7 x weekly - 1 sets - 10 reps - Standing Knee Flexion Stretch on Step  - 2 x daily - 7 x weekly - 1 sets - 10 reps - Terminal Knee Extension with Ball and Counter  - 2 x daily - 7 x weekly - 1 sets - 10 reps - 5 hold - Squat  - 1 x daily - 7 x weekly - 3 sets - 10 reps - Single Leg Squat with Resistance Loop  - 1 x daily - 7 x weekly - 3 sets - 10 reps - Forward T  - 1 x daily - 7 x weekly - 3 sets - 10  reps  ASSESSMENT:  CLINICAL IMPRESSION: Pt shows progressed with TUG score and LEFS. She has met LTGs #1 and #2. She continues to show good strength in L knee however still limited by weakness and pain in R knee. She is progressing well so far and I expect she will mostly be able to wrap up PT soon.     OBJECTIVE IMPAIRMENTS: Abnormal gait, decreased activity tolerance, decreased endurance, decreased mobility, difficulty walking, decreased ROM, decreased strength, hypomobility, increased edema, impaired flexibility, and pain.   ACTIVITY LIMITATIONS: carrying, lifting, bending, standing, squatting, sleeping, stairs, transfers, bed mobility, and locomotion level  PARTICIPATION LIMITATIONS: meal prep, cleaning, laundry, driving, shopping, community activity, occupation, yard work, school, and church  PERSONAL FACTORS: Time since onset of injury/illness/exacerbation and 1 comorbidity: R knee OA  are also affecting patient's functional outcome.   REHAB POTENTIAL: Good  CLINICAL DECISION MAKING: Stable/uncomplicated  EVALUATION COMPLEXITY: Low   GOALS: Goals reviewed with patient? Yes  SHORT TERM GOALS: Target date: 05/11/22  HEP Baseline:established today at eval Goal status: IN PROGRESS   LONG TERM GOALS: Target date: 07/20/22  Tug score less than 14 sec Baseline: 32 sec with r walker Goal status: MET- 05/31/22  2.  LEFS improve to 55/80 Baseline: 6/80 Goal status: MET- 05/31/22  3.  ROM L knee -5 ext to 115 flexion Baseline: -13 to 75 Goal status: IN PROGRESS  05/06/22: flexion 84, ext -4 05/10/22- 105 flexion 05/17/22- 4-120 deg.   4.  Strength L quads, hamstrings 4+/5 for improved efficiency with gait and transfers Baseline: 3-/5 Goal status: IN PROGRESS- 05/27/22 see chart   PLAN:  PT FREQUENCY: 2x/week  PT DURATION: 12 weeks  PLANNED INTERVENTIONS: Therapeutic exercises, Therapeutic activity, Neuromuscular re-education, Balance training, Gait training, Patient/Family  education, Self Care, and Joint mobilization  PLAN FOR NEXT SESSION: sees doctor Mon 5/6. continue to terminal knee extension flexibility and strength L.  Monitor ROM but flexion met.   Darleene Cleaver, PTA 05/31/2022, 11:28 AM

## 2022-06-01 ENCOUNTER — Other Ambulatory Visit (HOSPITAL_BASED_OUTPATIENT_CLINIC_OR_DEPARTMENT_OTHER): Payer: Self-pay

## 2022-06-01 ENCOUNTER — Encounter (HOSPITAL_BASED_OUTPATIENT_CLINIC_OR_DEPARTMENT_OTHER): Payer: Self-pay

## 2022-06-02 ENCOUNTER — Ambulatory Visit: Payer: Federal, State, Local not specified - PPO | Attending: Orthopedic Surgery

## 2022-06-02 DIAGNOSIS — R262 Difficulty in walking, not elsewhere classified: Secondary | ICD-10-CM | POA: Diagnosis present

## 2022-06-02 DIAGNOSIS — M25662 Stiffness of left knee, not elsewhere classified: Secondary | ICD-10-CM | POA: Diagnosis present

## 2022-06-02 DIAGNOSIS — Z96652 Presence of left artificial knee joint: Secondary | ICD-10-CM | POA: Insufficient documentation

## 2022-06-02 DIAGNOSIS — R293 Abnormal posture: Secondary | ICD-10-CM

## 2022-06-02 NOTE — Therapy (Addendum)
OUTPATIENT PHYSICAL THERAPY TREATMENT   Patient Name: Lori Klein MRN: [id number] DOB:1965-04-11, 57 y.o., female Today's Date: 06/02/2022  END OF SESSION:  PT End of Session - 06/02/22 1706     Visit Number 11    Date for PT Re-Evaluation 07/20/22    Progress Note Due on Visit 10    PT Start Time 1703    PT Stop Time 1745    PT Time Calculation (min) 42 min    Activity Tolerance Patient tolerated treatment well    Behavior During Therapy [facility] for tasks assessed/performed                  Past Medical History:  Diagnosis Date   Anxiety    Arthritis    Blood dyscrasia    has sickle cell trait   DVT (deep venous thrombosis) (HCC)    Headache    migraine   PONV (postoperative nausea and vomiting)    Pulmonary embolism (HCC)    Past Surgical History:  Procedure Laterality Date   CHOLECYSTECTOMY  2008   laparoscopic   IVC FILTER INSERTION N/A 04/12/2022   Procedure: IVC FILTER INSERTION;  Surgeon: Maeola Harman, MD;  Location: [facility] INVASIVE CV LAB;  Service: Cardiovascular;  Laterality: N/A;   IVC FILTER INSERTION Left    IVC VENOGRAPHY N/A 04/12/2022   Procedure: IVC Venography;  Surgeon: Maeola Harman, MD;  Location: [facility] INVASIVE CV LAB;  Service: Cardiovascular;  Laterality: N/A;   KNEE ARTHROPLASTY Left 04/21/2022   Procedure: COMPUTER ASSISTED TOTAL KNEE ARTHROPLASTY;  Surgeon: Samson Frederic, MD;  Location: [facility] ORS;  Service: Orthopedics;  Laterality: Left;   ulnar surgery Left 2017   released compressed nerve   WRIST SURGERY Right    nerve de fragmentation   Patient Active Problem List   Diagnosis Date Noted   Osteoarthritis of left knee 04/21/2022   Bilateral pulmonary embolism (HCC) 09/27/2015   Acute deep vein thrombosis (DVT) of right lower extremity (HCC) 09/27/2015    PCP: Tiana Loft  REFERRING PROVIDER: Samson Frederic  REFERRING DIAG: L TKA  THERAPY DIAG:  History of total knee arthroplasty,  left  Difficulty in walking, not elsewhere classified  Abnormal posture  Stiffness of left knee, not elsewhere classified  Rationale for Evaluation and Treatment: Rehabilitation  ONSET DATE: 04/21/22  SUBJECTIVE:   SUBJECTIVE STATEMENT: Pt reports some soreness/stiffness from pilates today.  PERTINENT HISTORY: Chronic B knee OA, s/p L TKA 04/21/22 PAIN:  Are you having pain? No  PRECAUTIONS: None  WEIGHT BEARING RESTRICTIONS: No  FALLS:  Has patient fallen in last 6 months? 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  No warmth, drainage, change in skin color noted around aquacel bandage    POSTURE: flexed B knees to 20 degrees and B hips 15 degrees in standing, R  knee varum posture  PALPATION: Normal L patellar mobility, mild tissue restriction/ tightness L distal quads   LOWER EXTREMITY ROM:  Active ROM Right eval Left eval Left 05/10/22 Left 05/17/22 Left 05/24/22  Knee flexion 120 75 105 112 pre/120 post manual   Knee extension -12 -13 -10 -4 0   (Blank rows = not tested)  LOWER EXTREMITY MMT:  MMT Right eval Left eval Left 05/27/22  Hip flexion  4- 4+  Hip extension     Hip abduction     Hip adduction     Hip internal rotation     Hip external rotation     Knee flexion  3 4+  Knee extension  3- 4  Ankle dorsiflexion      Ankle plantarflexion     Ankle inversion     Ankle eversion      (Blank rows = not tested)   FUNCTIONAL TESTS:  Timed up and go (TUG): 32s  GAIT: Distance walked: 35' Assistive device utilized: Environmental consultant - 2 wheeled Level of assistance: Modified independence Comments: very stiff B LE's, maintains B knees locked at 15 to 20 degrees flexion, wide base, leans heavily forward on walker   TODAY'S TREATMENT:                                                                                                                              DATE:  06/02/22 Therapeutic Exercise: to improve strength and mobility.  Demo, verbal and tactile cues throughout for technique. Nustep L6x59min Sidestep and monster walk GTB at knees 4x64ft Squats with green TB x 10 Single leg wall squats x 10 R/L Leg press 35# BLE 2x10; 15# R/L 2x10 Knee flexion 20# 2 x 10 B LE; 10## x 10 R/L Knee extension 20# BLE; R/L   IASTM to L quads, ITB  05/31/22 Therapeutic Exercise: to improve strength and mobility.  Demo, verbal and tactile cues throughout for technique. Elliptical 4 min L1.0  Single leg dead lift 2x10 both with UE support Knee flexion 20# 2 x 10 B LE; 5# 2 x 10 R/L Knee extension 20# BLE; R/L  10# 2x10 TUG: 8 seconds no AD Lower Extremity Functional Score: 55 / 80 = 68.8 % Vasopneumatic: 10 min game ready, low compression coldest (34 deg),  L LE with L LE elevated for post exercise edema, pain  05/27/22 Therapeutic Exercise: to improve strength and mobility.  Demo, verbal and tactile cues throughout for technique. Nustep L5x10min BLE only Standing squats 2x10 Single leg dead lift 2x10 L no UE support; R with UE support Retro step with head turns x 12  Single leg squat 2x10 L - pain on R  Leg press 35# BLE 2x10; 15# R/L 2x10 Knee extension 10# B con/L ecc x 15  Cp to L knee x 10 min 05/24/22 Therapeutic Exercise: to improve  strength and mobility.  Demo, verbal and tactile cues throughout for  technique. Stairs: 14 x 3  up/down with 1 HR - decreased eccentric control on R knee Knee flexion 10# x 10 B LE; 5# x 10 R/L Knee extension 10# 2x10; 5# x 10 R/L-- 2nd set 10# x 10 Runner stretch 2x30 sec for L gastroc  NEUROMUSCULAR RE-EDUCATION: To improve posture, balance, and kinesthesia. Ball toss with L SLS x 10  Clock balance 5x 1/2 circle pattern standing on foam oval R single leg RDL x 10 with UE support; x 10 w/o support- more challenged d/t balance R/L hip hikes on treadmill x 20 with UE support  Manual Therapy: to decrease muscle spasm and pain and improve mobility PA mobs tibia on femur, overpressure into knee extension  Vasopneumatic: 10 min game ready, low compression coldest (34 deg),  L LE with L LE elevated for post exercise edema, pain  05/20/22 Therapeutic Exercise: to improve strength and mobility.  Demo, verbal and tactile cues throughout for technique. Bike L1x63min Step downs fwd 4' 2x10 Lateral step downs 4' 2x10  Clock balance 5x 1/2 circle pattern L SLS 2x29 sec Leg press 25# 2x10 BLE; 15# 2x10 LLE Knee flexion 10# x 10 B con/ L ecc Knee extension 10# x 10 B con/L ecc  Manual Therapy: to decrease muscle spasm and pain and improve mobility PA mobs tibia on femur, overpressure into knee extension  Vasopneumatic: 10 min game ready, med compression coldest (34 deg),  L LE with L LE elevated for post exercise edema, pain  05/17/22 Therapeutic Exercise: to improve strength and mobility.  Demo, verbal and tactile cues throughout for technique. Nustep L5 x 6 min  BATCA knee extension 10# - both knees extending, then lowering weight with LLE only for eccentric strengthening 2 x 10  BATCA knee flexion  15# 2 x 15 bil Manual Therapy: to decrease muscle spasm and pain and improve mobility PA mobs tibia on femur, scar tissue mobilization, IASTM with edge tool to distal quads.  Vasopneumatic: 10 min game ready, med compression coldest (34 deg),  L LE with L LE  elevated for post exercise edema, pain    05/13/22 Therapeutic exercise: Recumbent cycle, 5 min rocking, progressing to full revolution Prone for TKE's, 10 reps 5 sec holds bolster under ankles Prone L hip extension with knee extended 15x Prone knee flexion stretch with overpressure from PT with contract/relax 15 reps Forward step ups L foot on reebok step, opposite leg knee drivers. Towel slides, wbing on L, for cw/ccw circles with R LE 15x Forward step ups L foot, followed by lateral heel taps, R LE with L foot on 2" step, for fine motor/eccentric control Ball on wall for TKE L 5 sec holds 10 reps  Vasopneumatic: 10 min game ready, med compression coldest (36 deg),  L LE with L LE elevated for post exercise edema, pain    05/10/22 Therapeutic Exercise: to improve strength and mobility.  Demo, verbal and tactile cues throughout for technique. Nustep L5 x 6 min Step ups (1 riser) 2 x 10 LLE (1 x 10 RLE) 2 UE support.  Knee flexion stretch on step x 10 Step down (no riser) 2 x 10 LLE only Eccentric heel raises 2 x 10 on step  Standing TKE at wall, ball behind knee, with isometric hold 10 x 5 sec Prone hip extension x 10 bil  Prone knee bends 2 x 10 LLE Manual Therapy: to decrease muscle spasm and pain and improve mobility Patellar mobilization, scar mobilization, Prone knee flexion stretch with hip hyperextended for rectus stretch, starting  with rotational movements, contract/relax at end range.  Vasopneumatic: 10 min game ready, med compression coldest (36 deg),  L LE with L LE elevated for post exercise edema, pain    PATIENT EDUCATION:  Education details: continued HEP, use CP PRN Person educated: Patient Education method: Explanation Education comprehension: verbalized understanding  HOME EXERCISE PROGRAM: Access Code: [email or web address] URL: [email or web address] Date: 05/27/2022 Prepared by: Verta Ellen  Exercises - Active Straight Leg Raise with Quad Set  - 2 x  daily - 7 x weekly - 1 sets - 10 reps - Standing Knee Flexion Stretch on Step  - 2 x daily - 7 x weekly - 1 sets - 10 reps - Terminal Knee Extension with Ball and Counter  - 2 x daily - 7 x weekly - 1 sets - 10 reps - 5 hold - Squat  - 1 x daily - 7 x weekly - 3 sets - 10 reps - Single Leg Squat with Resistance Loop  - 1 x daily - 7 x weekly - 3 sets - 10 reps - Forward T  - 1 x daily - 7 x weekly - 3 sets - 10 reps  ASSESSMENT:  CLINICAL IMPRESSION: Pt continues to show good response to exercises. Able to continue progressing to tolerance. She was more sore today from pilates that she did last night and soreness was addressed with MT. She will see surgeon on Monday, will anticipate she will be ready to wrap with PT real soon.      OBJECTIVE IMPAIRMENTS: Abnormal gait, decreased activity tolerance, decreased endurance, decreased mobility, difficulty walking, decreased ROM, decreased strength, hypomobility, increased edema, impaired flexibility, and pain.   ACTIVITY LIMITATIONS: carrying, lifting, bending, standing, squatting, sleeping, stairs, transfers, bed mobility, and locomotion level  PARTICIPATION LIMITATIONS: meal prep, cleaning, laundry, driving, shopping, community activity, occupation, yard work, school, and church  PERSONAL FACTORS: Time since onset of injury/illness/exacerbation and 1 comorbidity: R knee OA  are also affecting patient's functional outcome.   REHAB POTENTIAL: Good  CLINICAL DECISION MAKING: Stable/uncomplicated  EVALUATION COMPLEXITY: Low   GOALS: Goals reviewed with patient? Yes  SHORT TERM GOALS: Target date: 05/11/22  HEP Baseline:established today at eval Goal status: IN PROGRESS   LONG TERM GOALS: Target date: 07/20/22  Tug score less than 14 sec Baseline: 32 sec with r walker Goal status: MET- 05/31/22  2.  LEFS improve to 55/80 Baseline: 6/80 Goal status: MET- 05/31/22  3.  ROM L knee -5 ext to 115 flexion Baseline: -13 to 75 Goal status:  IN PROGRESS  05/06/22: flexion 84, ext -4 05/10/22- 105 flexion 05/17/22- 4-120 deg.  05/24/22- 0 deg  4.  Strength L quads, hamstrings 4+/5 for improved efficiency with gait and transfers Baseline: 3-/5 Goal status: IN PROGRESS- 05/27/22 see chart   PLAN:  PT FREQUENCY: 2x/week  PT DURATION: 12 weeks  PLANNED INTERVENTIONS: Therapeutic exercises, Therapeutic activity, Neuromuscular re-education, Balance training, Gait training, Patient/Family education, Self Care, and Joint mobilization  PLAN FOR NEXT SESSION: sees doctor Mon 5/6. continue to terminal knee extension flexibility and strength L.  Monitor ROM but flexion met.   Darleene Cleaver, PTA 06/02/2022, 5:52 PM   PHYSICAL THERAPY DISCHARGE SUMMARY  Visits from Start of Care: 11  Current functional level related to goals / functional outcomes: Improved L knee ROM and strength.    Remaining deficits: LLE weakness compared to RLE   Education / Equipment: HEP  Plan: Patient agrees to discharge.   Patient is being discharged due to meeting  the stated rehab goals.  She has returned to community based exercise program and has been released from PT by orthopedist.    Jena Gauss, PT, DPT 06/22/2022 2:37 PM

## 2022-06-09 ENCOUNTER — Encounter: Payer: Federal, State, Local not specified - PPO | Admitting: Physical Therapy

## 2022-06-14 ENCOUNTER — Ambulatory Visit: Payer: Federal, State, Local not specified - PPO | Admitting: Physical Therapy

## 2022-06-17 ENCOUNTER — Encounter: Payer: Federal, State, Local not specified - PPO | Admitting: Physical Therapy

## 2022-06-21 ENCOUNTER — Encounter: Payer: Federal, State, Local not specified - PPO | Admitting: Physical Therapy

## 2022-06-24 ENCOUNTER — Encounter: Payer: Federal, State, Local not specified - PPO | Admitting: Physical Therapy

## 2022-07-05 ENCOUNTER — Encounter: Payer: Federal, State, Local not specified - PPO | Admitting: Physical Therapy

## 2022-07-08 ENCOUNTER — Encounter: Payer: Federal, State, Local not specified - PPO | Admitting: Physical Therapy

## 2022-07-15 ENCOUNTER — Encounter: Payer: Federal, State, Local not specified - PPO | Admitting: Physical Therapy

## 2022-07-19 ENCOUNTER — Encounter: Payer: Federal, State, Local not specified - PPO | Admitting: Physical Therapy

## 2022-08-30 NOTE — Progress Notes (Signed)
Sent message, via epic in basket, requesting orders in epic from surgeon.  

## 2022-09-02 ENCOUNTER — Ambulatory Visit: Payer: Self-pay | Admitting: Student

## 2022-09-05 NOTE — Patient Instructions (Signed)
SURGICAL WAITING ROOM VISITATION Patients having surgery or a procedure may have no more than 2 support people in the waiting area - these visitors may rotate in the visitor waiting room.   Due to an increase in RSV and influenza rates and associated hospitalizations, children ages 43 and under may not visit patients in [facility] hospitals. If the patient needs to stay at the hospital during part of their recovery, the visitor guidelines for inpatient rooms apply.  PRE-OP VISITATION  Pre-op nurse will coordinate an appropriate time for 1 support person to accompany the patient in pre-op.  This support person may not rotate.  This visitor will be contacted when the time is appropriate for the visitor to come back in the pre-op area.  Please refer to the [facility] website for the visitor guidelines for Inpatients (after your surgery is over and you are in a regular room).  You are not required to quarantine at this time prior to your surgery. However, you must do this: Hand Hygiene often Do NOT share personal items Notify your provider if you are in close contact with someone who has COVID or you develop fever 100.4 or greater, new onset of sneezing, cough, sore throat, shortness of breath or body aches.  If you test positive for Covid or have been in contact with anyone that has tested positive in the last 10 days please notify you surgeon.    Your procedure is scheduled on:  Wednesday   September 15, 2022  Report to [facility] Main Entrance: Leota Jacobsen entrance where the [employer] is available.   Report to admitting at:  06:00   AM  Call this number if you have any questions or problems the morning of surgery [phone]  Do not eat food after Midnight the night prior to your surgery/procedure.  After Midnight you may have the following liquids until  05:30 AM DAY OF SURGERY  Clear Liquid Diet Water Black Coffee (sugar ok, NO MILK/CREAM OR CREAMERS)  Tea (sugar ok, NO  MILK/CREAM OR CREAMERS) regular and decaf                             Plain Jell-O  with no fruit (NO RED)                                           Fruit ices (not with fruit pulp, NO RED)                                     Popsicles (NO RED)                                                                  Juice: NO CITRUS JUICES: only apple, WHITE grape, WHITE cranberry Sports drinks like Gatorade or Powerade (NO RED)                    The day of surgery:  Drink ONE (1) Pre-Surgery G2 at 05:30  AM the morning of surgery.  Drink in one sitting. Do not sip.  This drink was given to you during your hospital pre-op appointment visit. Nothing else to drink after completing the Pre-Surgery  G2 : No candy, chewing gum or throat lozenges.    FOLLOW  ANY ADDITIONAL PRE OP INSTRUCTIONS YOU RECEIVED FROM YOUR SURGEON'S OFFICE!!!   Oral Hygiene is also important to reduce your risk of infection.        Remember - BRUSH YOUR TEETH THE MORNING OF SURGERY WITH YOUR REGULAR TOOTHPASTE  Do NOT smoke after Midnight the night before surgery.   STOP TAKING all Vitamins, Herbs and supplements 1 week before your surgery.   Take ONLY these medicines the morning of surgery with A SIP OF WATER: escitalopram (Lexapro), Buspirone (Buspar)                  You may not have any metal on your body including hair pins, jewelry, and body piercing  Do not wear make-up, lotions, powders, perfumes or deodorant  Do not wear nail polish including gel and S&S, artificial / acrylic nails, or any other type of covering on natural nails including finger and toenails. If you have artificial nails, gel coating, etc., that needs to be removed by a nail salon, Please have this removed prior to surgery. Not doing so may mean that your surgery could be cancelled or delayed if the Surgeon or anesthesia staff feels like they are unable to monitor you safely.   Do not shave 48 hours prior to surgery to avoid nicks in your skin  which may contribute to postoperative infections.    Contacts, Hearing Aids, dentures or bridgework may not be worn into surgery. DENTURES WILL BE REMOVED PRIOR TO SURGERY PLEASE DO NOT APPLY "Poly grip" OR ADHESIVES!!!   Patients discharged on the day of surgery will not be allowed to drive home.  Someone NEEDS to stay with you for the first 24 hours after anesthesia.  Do not bring your home medications to the hospital. The Pharmacy will dispense medications listed on your medication list to you during your admission in the Hospital.  Please read over the following fact sheets you were given: IF YOU HAVE QUESTIONS ABOUT YOUR PRE-OP INSTRUCTIONS, PLEASE CALL [phone].     Pre-operative 5 CHG Bath Instructions   You can play a key role in reducing the risk of infection after surgery. Your skin needs to be as free of germs as possible. You can reduce the number of germs on your skin by washing with CHG (chlorhexidine gluconate) soap before surgery. CHG is an antiseptic soap that kills germs and continues to kill germs even after washing.   DO NOT use if you have an allergy to chlorhexidine/CHG or antibacterial soaps. If your skin becomes reddened or irritated, stop using the CHG and notify one of our RNs at [phone]  Please shower with the CHG soap starting 4 days before surgery using the following schedule: START SHOWERS ON   SATURDAY  September 11, 2022  Please keep in mind the following:  DO NOT shave, including legs and underarms, starting the day of your first shower.   You may shave your face at any point before/day of surgery.   Place clean sheets on your bed the day you start using CHG soap. Use a clean washcloth (not used since being washed) for each shower. DO NOT sleep with pets once you  start using the CHG.   CHG Shower Instructions:  If you choose to wash your hair and private area, wash first with your normal shampoo/soap.  After you use shampoo/soap, rinse your hair and body thoroughly to remove shampoo/soap residue.  Turn the water OFF and apply about 3 tablespoons (45 ml) of CHG soap to a CLEAN washcloth.  Apply CHG soap ONLY FROM YOUR NECK DOWN TO YOUR TOES (washing for 3-5 minutes)  DO NOT use CHG soap on face, private areas, open wounds, or sores.  Pay special attention to the area where your surgery is being performed.  If you are having back surgery, having someone wash your back for you may be helpful.  Wait 2 minutes after CHG soap is applied, then you may rinse off the CHG soap.  Pat dry with a clean towel  Put on clean clothes/pajamas   If you choose to wear lotion, please use ONLY the CHG-compatible lotions on the back of this paper.     Additional instructions for the day of surgery: DO NOT APPLY any lotions, deodorants, cologne, or perfumes.   Put on clean/comfortable clothes.  Brush your teeth.  Ask your nurse before applying any prescription medications to the skin.      CHG Compatible Lotions   Aveeno Moisturizing lotion  Cetaphil Moisturizing Cream  Cetaphil Moisturizing Lotion  Clairol Herbal Essence Moisturizing Lotion, Dry Skin  Clairol Herbal Essence Moisturizing Lotion, Extra Dry Skin  Clairol Herbal Essence Moisturizing Lotion, Normal Skin  Curel Age Defying Therapeutic Moisturizing Lotion with Alpha Hydroxy  Curel Extreme Care Body Lotion  Curel Soothing Hands Moisturizing Hand Lotion  Curel Therapeutic Moisturizing Cream, Fragrance-Free  Curel Therapeutic Moisturizing Lotion, Fragrance-Free  Curel Therapeutic Moisturizing Lotion, Original Formula  Eucerin Daily Replenishing Lotion  Eucerin Dry Skin Therapy Plus Alpha Hydroxy Crme  Eucerin Dry Skin Therapy Plus Alpha Hydroxy Lotion  Eucerin Original Crme  Eucerin Original  Lotion  Eucerin Plus Crme Eucerin Plus Lotion  Eucerin TriLipid Replenishing Lotion  Keri Anti-Bacterial Hand Lotion  Keri Deep Conditioning Original Lotion Dry Skin Formula Softly Scented  Keri Deep Conditioning Original Lotion, Fragrance Free Sensitive Skin Formula  Keri Lotion Fast Absorbing Fragrance Free Sensitive Skin Formula  Keri Lotion Fast Absorbing Softly Scented Dry Skin Formula  Keri Original Lotion  Keri Skin Renewal Lotion Keri Silky Smooth Lotion  Keri Silky Smooth Sensitive Skin Lotion  Nivea Body Creamy Conditioning Oil  Nivea Body Extra Enriched Lotion  Nivea Body Original Lotion  Nivea Body Sheer Moisturizing Lotion Nivea Crme  Nivea Skin Firming Lotion  NutraDerm 30 Skin Lotion  NutraDerm Skin Lotion  NutraDerm Therapeutic Skin Cream  NutraDerm Therapeutic Skin Lotion  ProShield Protective Hand Cream  Provon moisturizing lotion   FAILURE TO FOLLOW THESE INSTRUCTIONS MAY RESULT IN THE CANCELLATION OF YOUR SURGERY  PATIENT SIGNATURE_________________________________  NURSE SIGNATURE__________________________________  ________________________________________________________________________       Rogelia Mire    An incentive spirometer is a tool that can help keep your lungs clear and active. This tool measures how well you are filling your lungs with each breath. Taking  long deep breaths may help reverse or decrease the chance of developing breathing (pulmonary) problems (especially infection) following: A long period of time when you are unable to move or be active. BEFORE THE PROCEDURE  If the spirometer includes an indicator to show your best effort, your nurse or respiratory therapist will set it to a desired goal. If possible, sit up straight or lean slightly forward. Try not to slouch. Hold the incentive spirometer in an upright position. INSTRUCTIONS FOR USE  Sit on the edge of your bed if possible, or sit up as far as you can in bed or  on a chair. Hold the incentive spirometer in an upright position. Breathe out normally. Place the mouthpiece in your mouth and seal your lips tightly around it. Breathe in slowly and as deeply as possible, raising the piston or the ball toward the top of the column. Hold your breath for 3-5 seconds or for as long as possible. Allow the piston or ball to fall to the bottom of the column. Remove the mouthpiece from your mouth and breathe out normally. Rest for a few seconds and repeat Steps 1 through 7 at least 10 times every 1-2 hours when you are awake. Take your time and take a few normal breaths between deep breaths. The spirometer may include an indicator to show your best effort. Use the indicator as a goal to work toward during each repetition. After each set of 10 deep breaths, practice coughing to be sure your lungs are clear. If you have an incision (the cut made at the time of surgery), support your incision when coughing by placing a pillow or rolled up towels firmly against it. Once you are able to get out of bed, walk around indoors and cough well. You may stop using the incentive spirometer when instructed by your caregiver.  RISKS AND COMPLICATIONS Take your time so you do not get dizzy or light-headed. If you are in pain, you may need to take or ask for pain medication before doing incentive spirometry. It is harder to take a deep breath if you are having pain. AFTER USE Rest and breathe slowly and easily. It can be helpful to keep track of a log of your progress. Your caregiver can provide you with a simple table to help with this. If you are using the spirometer at home, follow these instructions: SEEK MEDICAL CARE IF:  You are having difficultly using the spirometer. You have trouble using the spirometer as often as instructed. Your pain medication is not giving enough relief while using the spirometer. You develop fever of 100.5 F (38.1 C) or higher.                                                                                                     SEEK IMMEDIATE MEDICAL CARE IF:  You cough up bloody sputum that had not been present before. You develop fever of 102 F (38.9 C) or greater. You develop worsening pain at or near the incision site. MAKE SURE YOU:  Understand these instructions. Will watch your condition.  Will get help right away if you are not doing well or get worse. Document Released: 05/31/2006 Document Revised: 04/12/2011 Document Reviewed: 08/01/2006 [facility] Patient Information 2014 [place], [place].

## 2022-09-05 NOTE — Progress Notes (Signed)
COVID Vaccine received:  []  No [x]  Yes Date of any COVID positive Test in last 90 days:  PCP - Meredeth Ide, PA-C   at [facility]  [phone]  Fax) [phone]    Medical Clearance: 03-01-22 note in [facility] Cardiologist - none Vascular Surgeon- Dr. Lemar Livings   Chest x-ray - 09-26-2015 EKG -  04-09-2022   Epic Stress Test -  ECHO -  Cardiac Cath -   PCR screen: [x]  Ordered & Completed           []   No Order but Needs PROFEND           []   N/A for this surgery  Surgery Plan:  [x]  Ambulatory                            []  Outpatient in bed                            []  Admit  Anesthesia:    []  General  [x]  Spinal                           []   Choice []   MAC  Pacemaker / ICD device [x]  No []  Yes   Spinal Cord Stimulator:[x]  No []  Yes       History of Sleep Apnea? [x]  No []  Yes   CPAP used?- [x]  No []  Yes    Does the patient monitor blood sugar?          [x]  No []  Yes  []  N/A Last A1c was 5.7 (Pre-DM) on 03-01-2022  Patient has: []  NO Hx DM   [x]  Pre-DM                 []  DM1  []   DM2 Does patient have a [employer] or Dexacom? [x]  No []  Yes   Fasting Blood Sugar Ranges-  Checks Blood Sugar ___0__ times a day  Blood Thinner / Instructions:  none Aspirin Instructions:  none Comments: Dr. Randie Heinz to put in IVC filter on 04-12-2022. Patient is currently not on any anticoagulation.   ERAS Protocol Ordered: []  No  [x]  Yes PRE-SURGERY []  ENSURE  [x]  G2  Patient is to be NPO after: 05:30 am  Comments:Patient was given the 5 CHG shower / bath instructions for TKA surgery along with 2 bottles of the CHG soap. Patient will start this on:   Saturday  09-11-22     All questions were asked and answered, Patient voiced understanding of this process.    Activity level: Patient is able climb a flight of stairs without difficulty; [x]  No CP  [x]  No SOB, but would have leg pain. Patient can perform ADLs without assistance.   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  Patient verbalized understanding and agreement to the Pre-Surgical Instructions that were given to them at this PAT appointment. Patient was also educated of the need to review these PAT instructions again prior to her surgery.I reviewed the appropriate phone numbers to call if they have any and questions or concerns.

## 2022-09-06 ENCOUNTER — Encounter (HOSPITAL_COMMUNITY)
Admission: RE | Admit: 2022-09-06 | Discharge: 2022-09-06 | Disposition: A | Discharge status: Home / Self Care | Payer: Federal, State, Local not specified - PPO | Source: Ambulatory Visit | Attending: Orthopedic Surgery | Admitting: Orthopedic Surgery

## 2022-09-06 ENCOUNTER — Other Ambulatory Visit: Payer: Self-pay

## 2022-09-06 ENCOUNTER — Encounter (HOSPITAL_COMMUNITY): Payer: Self-pay

## 2022-09-06 VITALS — BP 126/72 | HR 68 | Temp 97.8°F | Resp 16 | Ht 67.0 in | Wt 171.0 lb

## 2022-09-06 DIAGNOSIS — R7303 Prediabetes: Secondary | ICD-10-CM | POA: Insufficient documentation

## 2022-09-06 DIAGNOSIS — Z01812 Encounter for preprocedural laboratory examination: Secondary | ICD-10-CM | POA: Insufficient documentation

## 2022-09-06 DIAGNOSIS — Z01818 Encounter for other preprocedural examination: Secondary | ICD-10-CM

## 2022-09-06 HISTORY — DX: Prediabetes: R73.03

## 2022-09-06 LAB — CBC
HCT: 39.6 % (ref 36.0–46.0)
Hemoglobin: 12.9 g/dL (ref 12.0–15.0)
MCH: 27.2 pg (ref 26.0–34.0)
MCHC: 32.6 g/dL (ref 30.0–36.0)
MCV: 83.5 fL (ref 80.0–100.0)
Platelets: 314 10*3/uL (ref 150–400)
RBC: 4.74 MIL/uL (ref 3.87–5.11)
RDW: 14.9 % (ref 11.5–15.5)
WBC: 6.8 10*3/uL (ref 4.0–10.5)
nRBC: 0 % (ref 0.0–0.2)

## 2022-09-06 LAB — BASIC METABOLIC PANEL
Anion gap: 8 (ref 5–15)
BUN: 17 mg/dL (ref 6–20)
CO2: 27 mmol/L (ref 22–32)
Calcium: 9.2 mg/dL (ref 8.9–10.3)
Chloride: 106 mmol/L (ref 98–111)
Creatinine, Ser: 0.88 mg/dL (ref 0.44–1.00)
GFR, Estimated: >60 mL/min (ref >60)
Glucose, Bld: 81 mg/dL (ref 70–99)
Potassium: 4 mmol/L (ref 3.5–5.1)
Sodium: 141 mmol/L (ref 135–145)

## 2022-09-06 LAB — SURGICAL PCR SCREEN
MRSA, PCR: NEGATIVE
Staphylococcus aureus: NEGATIVE

## 2022-09-13 ENCOUNTER — Ambulatory Visit: Payer: Self-pay | Admitting: Student

## 2022-09-13 NOTE — H&P (View-Only) (Signed)
TOTAL KNEE ADMISSION H&P  Patient is being admitted for right total knee arthroplasty.  Subjective:  Chief Complaint:right knee pain.  HPI: Lori Klein, 57 y.o. female, has a history of pain and functional disability in the right knee due to arthritis and has failed non-surgical conservative treatments for greater than 12 weeks to includeNSAID's and/or analgesics, corticosteriod injections, flexibility and strengthening excercises, use of assistive devices, and activity modification.  Onset of symptoms was gradual, starting 10 years ago with rapidlly worsening course since that time. The patient noted no past surgery on the right knee(s).  Patient currently rates pain in the right knee(s) at 10 out of 10 with activity. Patient has night pain, worsening of pain with activity and weight bearing, pain that interferes with activities of daily living, pain with passive range of motion, crepitus, and joint swelling.  Patient has evidence of subchondral cysts, subchondral sclerosis, periarticular osteophytes, and joint space narrowing by imaging studies. There is no active infection.  Patient Active Problem List   Diagnosis Date Noted   Osteoarthritis of left knee 04/21/2022   Bilateral pulmonary embolism (HCC) 09/27/2015   Acute deep vein thrombosis (DVT) of right lower extremity (HCC) 09/27/2015   Past Medical History:  Diagnosis Date   Anxiety    Arthritis    Blood dyscrasia    has sickle cell trait   DVT (deep venous thrombosis) (HCC)    Headache    migraine   PONV (postoperative nausea and vomiting)    Pre-diabetes    Pulmonary embolism [facility]     Past Surgical History:  Procedure Laterality Date   CHOLECYSTECTOMY  2008   laparoscopic   IVC FILTER INSERTION N/A 04/12/2022   Procedure: IVC FILTER INSERTION;  Surgeon: Maeola Harman, MD;  Location: [facility] INVASIVE CV LAB;  Service: Cardiovascular;  Laterality: N/A;   IVC VENOGRAPHY N/A 04/12/2022   Procedure: IVC  Venography;  Surgeon: Maeola Harman, MD;  Location: [facility] INVASIVE CV LAB;  Service: Cardiovascular;  Laterality: N/A;   KNEE ARTHROPLASTY Left 04/21/2022   Procedure: COMPUTER ASSISTED TOTAL KNEE ARTHROPLASTY;  Surgeon: Samson Frederic, MD;  Location: [facility] ORS;  Service: Orthopedics;  Laterality: Left;   ulnar surgery Left 2017   released compressed nerve   WRIST SURGERY Right    nerve de fragmentation    Current Outpatient Medications  Medication Sig Dispense Refill Last Dose   amoxicillin (AMOXIL) 500 MG tablet Take 4 tablets (2000 mg) by mouth 1 hour prior to dental appointments.      atorvastatin (LIPITOR) 40 MG tablet Take 40 mg by mouth daily.      BIOTIN PO Take 1 capsule by mouth daily.      busPIRone (BUSPAR) 10 MG tablet Take 20 mg by mouth 2 (two) times daily.      Cyanocobalamin (B-12 PO) Take 1 capsule by mouth daily.      diclofenac (VOLTAREN) 75 MG EC tablet Take 75 mg by mouth 2 (two) times daily.      escitalopram (LEXAPRO) 20 MG tablet Take 20 mg by mouth daily.      Naphazoline-Pheniramine (VISINE OP) Place 1 drop into both eyes daily as needed (irritation).      Scar Treatment Products [facility]) GEL Apply 1 Application topically daily. Apply to scar on knee      SUMAtriptan (IMITREX) 100 MG tablet Take 100 mg by mouth every 2 (two) hours as needed for migraine.      VITAMIN E PO Take 1 capsule by mouth  daily.      No current facility-administered medications for this visit.   No Known Allergies  Social History   Tobacco Use   Smoking status: Never   Smokeless tobacco: Never  Substance Use Topics   Alcohol use: Yes    Comment: occasionally    Family History  Problem Relation Age of Onset   Hypertension Mother    Hypertension Father    Diabetes Other      Review of Systems  Musculoskeletal:  Positive for arthralgias, gait problem and joint swelling.    Objective:  Physical Exam Constitutional:      Appearance: Normal appearance.  HENT:      Head: Normocephalic and atraumatic.     Nose: Nose normal.     Mouth/Throat:     Mouth: Mucous membranes are moist.     Pharynx: Oropharynx is clear.  Eyes:     Conjunctiva/sclera: Conjunctivae normal.  Cardiovascular:     Rate and Rhythm: Normal rate and regular rhythm.     Pulses: Normal pulses.     Heart sounds: Normal heart sounds.  Pulmonary:     Effort: Pulmonary effort is normal.     Breath sounds: Normal breath sounds.  Abdominal:     General: Abdomen is flat.     Palpations: Abdomen is soft.  Genitourinary:    Comments: deferred Musculoskeletal:     Cervical back: Normal range of motion and neck supple.     Comments: Examination of the right knee reveals no skin wounds or lesions. She has a varus deformity. She has some swelling, trace effusion. No warmth or erythema. Tenderness to palpation medial joint line, lateral joint line, peripatellar retinacular tissues with positive grind side. Crepitation with range of motion. Range of motion is 12 to 120 degrees without any ligamentous instability. Painless range of motion of the hips.  Distally, she is neurovascular intact.  Skin:    General: Skin is warm and dry.     Capillary Refill: Capillary refill takes less than 2 seconds.  Neurological:     General: No focal deficit present.     Mental Status: She is alert and oriented to person, place, and time.  Psychiatric:        Mood and Affect: Mood normal.        Behavior: Behavior normal.        Thought Content: Thought content normal.        Judgment: Judgment normal.     Vital signs in last 24 hours: @VSRANGES @  Labs:   Estimated body mass index is 26.78 kg/m as calculated from the following:   Height as of 09/06/22: 5\' 7"  (1.702 m).   Weight as of 09/06/22: 77.6 kg.   Imaging Review Plain radiographs demonstrate severe degenerative joint disease of the right knee(s). The overall alignment issignificant varus. The bone quality appears to be adequate for age and  reported activity level.      Assessment/Plan:  End stage arthritis, right knee   The patient history, physical examination, clinical judgment of the provider and imaging studies are consistent with end stage degenerative joint disease of the right knee(s) and total knee arthroplasty is deemed medically necessary. The treatment options including medical management, injection therapy arthroscopy and arthroplasty were discussed at length. The risks and benefits of total knee arthroplasty were presented and reviewed. The risks due to aseptic loosening, infection, stiffness, patella tracking problems, thromboembolic complications and other imponderables were discussed. The patient acknowledged the explanation, agreed to proceed  with the plan and consent was signed. Patient is being admitted for inpatient treatment for surgery, pain control, PT, OT, prophylactic antibiotics, VTE prophylaxis, progressive ambulation and ADL's and discharge planning. The patient is planning to be discharged home with OPPT.   Therapy Plans: outpatient therapy. Med center [facility] printed copy given to patient today start 09/20/22.  Disposition: Home with Husband Planned DVT Prophylaxis: Eliquis 2.5mg  BID.  DME needed: Has rolling walker and ice machine.  PCP: Cleared.  TXA: IV Allergies: NDKA.  Anesthesia Concerns: Easily nauseated. Did okay pre-medicated last time.  BMI: 27.0 Last HgbA1c: 5.7 Other: - History of DVT and PE.  - IVC filter placement with Dr. Randie Heinz 04/12/22. Still has filter.  - Did not do well on the oxycodone.  - Hydrocodone, zofran, methocarbamol, continue diclofenac.  - Wants thigh high compression stockings order placed.  [facility] pharmacy.  - 09/06/22: Hgb 12.9, K+ 4.0, Cr. 0.88.     Patient's anticipated LOS is less than 2 midnights, meeting these requirements: - Younger than 44 - Lives within 1 hour of care - Has a competent adult at home to recover with post-op  recover - NO history of  - Chronic pain requiring opiods  - Diabetes  - Coronary Artery Disease  - Heart failure  - Heart attack  - Stroke  - DVT/VTE  - Cardiac arrhythmia  - Respiratory Failure/COPD  - Renal failure  - Anemia  - Advanced Liver disease

## 2022-09-13 NOTE — H&P (Signed)
TOTAL KNEE ADMISSION H&P  Patient is being admitted for right total knee arthroplasty.  Subjective:  Chief Complaint:right knee pain.  HPI: BILEN GOLE, 57 y.o. female, has a history of pain and functional disability in the right knee due to arthritis and has failed non-surgical conservative treatments for greater than 12 weeks to includeNSAID's and/or analgesics, corticosteriod injections, flexibility and strengthening excercises, use of assistive devices, and activity modification.  Onset of symptoms was gradual, starting 10 years ago with rapidlly worsening course since that time. The patient noted no past surgery on the right knee(s).  Patient currently rates pain in the right knee(s) at 10 out of 10 with activity. Patient has night pain, worsening of pain with activity and weight bearing, pain that interferes with activities of daily living, pain with passive range of motion, crepitus, and joint swelling.  Patient has evidence of subchondral cysts, subchondral sclerosis, periarticular osteophytes, and joint space narrowing by imaging studies. There is no active infection.  Patient Active Problem List   Diagnosis Date Noted   Osteoarthritis of left knee 04/21/2022   Bilateral pulmonary embolism (HCC) 09/27/2015   Acute deep vein thrombosis (DVT) of right lower extremity (HCC) 09/27/2015   Past Medical History:  Diagnosis Date   Anxiety    Arthritis    Blood dyscrasia    has sickle cell trait   DVT (deep venous thrombosis) (HCC)    Headache    migraine   PONV (postoperative nausea and vomiting)    Pre-diabetes    Pulmonary embolism [facility])     Past Surgical History:  Procedure Laterality Date   CHOLECYSTECTOMY  2008   laparoscopic   IVC FILTER INSERTION N/A 04/12/2022   Procedure: IVC FILTER INSERTION;  Surgeon: Maeola Harman, MD;  Location: [facility] INVASIVE CV LAB;  Service: Cardiovascular;  Laterality: N/A;   IVC VENOGRAPHY N/A 04/12/2022   Procedure: IVC  Venography;  Surgeon: Maeola Harman, MD;  Location: [facility] INVASIVE CV LAB;  Service: Cardiovascular;  Laterality: N/A;   KNEE ARTHROPLASTY Left 04/21/2022   Procedure: COMPUTER ASSISTED TOTAL KNEE ARTHROPLASTY;  Surgeon: Samson Frederic, MD;  Location: [facility] ORS;  Service: Orthopedics;  Laterality: Left;   ulnar surgery Left 2017   released compressed nerve   WRIST SURGERY Right    nerve de fragmentation    Current Outpatient Medications  Medication Sig Dispense Refill Last Dose   amoxicillin (AMOXIL) 500 MG tablet Take 4 tablets (2000 mg) by mouth 1 hour prior to dental appointments.      atorvastatin (LIPITOR) 40 MG tablet Take 40 mg by mouth daily.      BIOTIN PO Take 1 capsule by mouth daily.      busPIRone (BUSPAR) 10 MG tablet Take 20 mg by mouth 2 (two) times daily.      Cyanocobalamin (B-12 PO) Take 1 capsule by mouth daily.      diclofenac (VOLTAREN) 75 MG EC tablet Take 75 mg by mouth 2 (two) times daily.      escitalopram (LEXAPRO) 20 MG tablet Take 20 mg by mouth daily.      Naphazoline-Pheniramine (VISINE OP) Place 1 drop into both eyes daily as needed (irritation).      Scar Treatment Products [facility]) GEL Apply 1 Application topically daily. Apply to scar on knee      SUMAtriptan (IMITREX) 100 MG tablet Take 100 mg by mouth every 2 (two) hours as needed for migraine.      VITAMIN E PO Take 1 capsule by mouth  daily.      No current facility-administered medications for this visit.   No Known Allergies  Social History   Tobacco Use   Smoking status: Never   Smokeless tobacco: Never  Substance Use Topics   Alcohol use: Yes    Comment: occasionally    Family History  Problem Relation Age of Onset   Hypertension Mother    Hypertension Father    Diabetes Other      Review of Systems  Musculoskeletal:  Positive for arthralgias, gait problem and joint swelling.    Objective:  Physical Exam Constitutional:      Appearance: Normal appearance.  HENT:      Head: Normocephalic and atraumatic.     Nose: Nose normal.     Mouth/Throat:     Mouth: Mucous membranes are moist.     Pharynx: Oropharynx is clear.  Eyes:     Conjunctiva/sclera: Conjunctivae normal.  Cardiovascular:     Rate and Rhythm: Normal rate and regular rhythm.     Pulses: Normal pulses.     Heart sounds: Normal heart sounds.  Pulmonary:     Effort: Pulmonary effort is normal.     Breath sounds: Normal breath sounds.  Abdominal:     General: Abdomen is flat.     Palpations: Abdomen is soft.  Genitourinary:    Comments: deferred Musculoskeletal:     Cervical back: Normal range of motion and neck supple.     Comments: Examination of the right knee reveals no skin wounds or lesions. She has a varus deformity. She has some swelling, trace effusion. No warmth or erythema. Tenderness to palpation medial joint line, lateral joint line, peripatellar retinacular tissues with positive grind side. Crepitation with range of motion. Range of motion is 12 to 120 degrees without any ligamentous instability. Painless range of motion of the hips.  Distally, she is neurovascular intact.  Skin:    General: Skin is warm and dry.     Capillary Refill: Capillary refill takes less than 2 seconds.  Neurological:     General: No focal deficit present.     Mental Status: She is alert and oriented to person, place, and time.  Psychiatric:        Mood and Affect: Mood normal.        Behavior: Behavior normal.        Thought Content: Thought content normal.        Judgment: Judgment normal.     Vital signs in last 24 hours: @VSRANGES @  Labs:   Estimated body mass index is 26.78 kg/m as calculated from the following:   Height as of 09/06/22: 5\' 7"  (1.702 m).   Weight as of 09/06/22: 77.6 kg.   Imaging Review Plain radiographs demonstrate severe degenerative joint disease of the right knee(s). The overall alignment issignificant varus. The bone quality appears to be adequate for age and  reported activity level.      Assessment/Plan:  End stage arthritis, right knee   The patient history, physical examination, clinical judgment of the provider and imaging studies are consistent with end stage degenerative joint disease of the right knee(s) and total knee arthroplasty is deemed medically necessary. The treatment options including medical management, injection therapy arthroscopy and arthroplasty were discussed at length. The risks and benefits of total knee arthroplasty were presented and reviewed. The risks due to aseptic loosening, infection, stiffness, patella tracking problems, thromboembolic complications and other imponderables were discussed. The patient acknowledged the explanation, agreed to proceed  with the plan and consent was signed. Patient is being admitted for inpatient treatment for surgery, pain control, PT, OT, prophylactic antibiotics, VTE prophylaxis, progressive ambulation and ADL's and discharge planning. The patient is planning to be discharged home with OPPT.   Therapy Plans: outpatient therapy. Med center [facility] printed copy given to patient today start 09/20/22.  Disposition: Home with Husband Planned DVT Prophylaxis: Eliquis 2.5mg  BID.  DME needed: Has rolling walker and ice machine.  PCP: Cleared.  TXA: IV Allergies: NDKA.  Anesthesia Concerns: Easily nauseated. Did okay pre-medicated last time.  BMI: 27.0 Last HgbA1c: 5.7 Other: - History of DVT and PE.  - IVC filter placement with Dr. Randie Heinz 04/12/22. Still has filter.  - Did not do well on the oxycodone.  - Hydrocodone, zofran, methocarbamol, continue diclofenac.  - Wants thigh high compression stockings order placed.  [facility] pharmacy.  - 09/06/22: Hgb 12.9, K+ 4.0, Cr. 0.88.     Patient's anticipated LOS is less than 2 midnights, meeting these requirements: - Younger than 44 - Lives within 1 hour of care - Has a competent adult at home to recover with post-op  recover - NO history of  - Chronic pain requiring opiods  - Diabetes  - Coronary Artery Disease  - Heart failure  - Heart attack  - Stroke  - DVT/VTE  - Cardiac arrhythmia  - Respiratory Failure/COPD  - Renal failure  - Anemia  - Advanced Liver disease

## 2022-09-14 NOTE — Anesthesia Preprocedure Evaluation (Signed)
Anesthesia Evaluation  Patient identified by MRN, date of birth, ID band Patient awake    Reviewed: Allergy & Precautions, NPO status , Patient's Chart, lab work & pertinent test results  History of Anesthesia Complications (+) PONV and history of anesthetic complications  Airway Mallampati: II  TM Distance: >3 FB Neck ROM: Full    Dental  (+) Teeth Intact, Dental Advisory Given   Pulmonary PE   Pulmonary exam normal breath sounds clear to auscultation       Cardiovascular + DVT  Normal cardiovascular exam Rhythm:Regular Rate:Normal     Neuro/Psych  Headaches PSYCHIATRIC DISORDERS Anxiety        GI/Hepatic negative GI ROS, Neg liver ROS,,,  Endo/Other  negative endocrine ROS    Renal/GU negative Renal ROS     Musculoskeletal  (+) Arthritis , Osteoarthritis,    Abdominal   Peds  Hematology  (+) Blood dyscrasia, Sickle cell trait Plt 314k   Anesthesia Other Findings   Reproductive/Obstetrics                             Anesthesia Physical Anesthesia Plan  ASA: 3  Anesthesia Plan: Spinal   Post-op Pain Management: Regional block* and Tylenol PO (pre-op)*   Induction: Intravenous  PONV Risk Score and Plan: 3 and Midazolam, TIVA, Dexamethasone and Ondansetron  Airway Management Planned: Natural Airway and Simple Face Mask  Additional Equipment:   Intra-op Plan:   Post-operative Plan:   Informed Consent: I have reviewed the patients History and Physical, chart, labs and discussed the procedure including the risks, benefits and alternatives for the proposed anesthesia with the patient or authorized representative who has indicated his/her understanding and acceptance.     Dental advisory given  Plan Discussed with: CRNA  Anesthesia Plan Comments:        Anesthesia Quick Evaluation

## 2022-09-15 ENCOUNTER — Ambulatory Visit (HOSPITAL_COMMUNITY)
Admission: RE | Admit: 2022-09-15 | Discharge: 2022-09-15 | Disposition: A | Discharge status: Home / Self Care | Payer: Federal, State, Local not specified - PPO | Source: Ambulatory Visit | Attending: Orthopedic Surgery | Admitting: Orthopedic Surgery

## 2022-09-15 ENCOUNTER — Ambulatory Visit (HOSPITAL_COMMUNITY): Payer: Federal, State, Local not specified - PPO | Admitting: Anesthesiology

## 2022-09-15 ENCOUNTER — Encounter (HOSPITAL_COMMUNITY)
Admission: RE | Disposition: A | Discharge status: Home / Self Care | Payer: Self-pay | Source: Ambulatory Visit | Attending: Orthopedic Surgery

## 2022-09-15 ENCOUNTER — Ambulatory Visit (HOSPITAL_COMMUNITY): Payer: Federal, State, Local not specified - PPO

## 2022-09-15 ENCOUNTER — Other Ambulatory Visit: Payer: Self-pay

## 2022-09-15 ENCOUNTER — Other Ambulatory Visit (HOSPITAL_COMMUNITY): Payer: Self-pay

## 2022-09-15 ENCOUNTER — Encounter (HOSPITAL_COMMUNITY): Payer: Self-pay | Admitting: Orthopedic Surgery

## 2022-09-15 ENCOUNTER — Other Ambulatory Visit (HOSPITAL_BASED_OUTPATIENT_CLINIC_OR_DEPARTMENT_OTHER): Payer: Self-pay

## 2022-09-15 DIAGNOSIS — F419 Anxiety disorder, unspecified: Secondary | ICD-10-CM | POA: Diagnosis not present

## 2022-09-15 DIAGNOSIS — D573 Sickle-cell trait: Secondary | ICD-10-CM | POA: Diagnosis not present

## 2022-09-15 DIAGNOSIS — Z96651 Presence of right artificial knee joint: Secondary | ICD-10-CM

## 2022-09-15 DIAGNOSIS — Z86718 Personal history of other venous thrombosis and embolism: Secondary | ICD-10-CM | POA: Insufficient documentation

## 2022-09-15 DIAGNOSIS — M1711 Unilateral primary osteoarthritis, right knee: Secondary | ICD-10-CM | POA: Diagnosis not present

## 2022-09-15 HISTORY — PX: KNEE ARTHROPLASTY: SHX992

## 2022-09-15 SURGERY — ARTHROPLASTY, KNEE, TOTAL, USING IMAGELESS COMPUTER-ASSISTED NAVIGATION
Anesthesia: Spinal | Site: Knee | Laterality: Right

## 2022-09-15 MED ORDER — DOCUSATE SODIUM 100 MG PO CAPS
100.0000 mg | ORAL_CAPSULE | Freq: Two times a day (BID) | ORAL | 0 refills | Status: AC
  Filled 2022-09-15: qty 100, 50d supply, fill #0

## 2022-09-15 MED ORDER — BUPIVACAINE IN DEXTROSE 0.75-8.25 % IT SOLN
INTRATHECAL | Status: DC | PRN
  Administered 2022-09-15: 1.8 mL via INTRATHECAL

## 2022-09-15 MED ORDER — CHLORHEXIDINE GLUCONATE 0.12 % MT SOLN
15.0000 mL | Freq: Once | OROMUCOSAL | Status: AC
  Administered 2022-09-15: 15 mL via OROMUCOSAL

## 2022-09-15 MED ORDER — METOCLOPRAMIDE HCL 5 MG/ML IJ SOLN: 5.0000 mg | Freq: Three times a day (TID) | INTRAMUSCULAR | Status: DC | PRN

## 2022-09-15 MED ORDER — PROPOFOL 10 MG/ML IV BOLUS
INTRAVENOUS | Status: DC | PRN
  Administered 2022-09-15: 170 mg via INTRAVENOUS

## 2022-09-15 MED ORDER — LACTATED RINGERS IV BOLUS
250.0000 mL | Freq: Once | INTRAVENOUS | Status: AC
  Administered 2022-09-15: 250 mL via INTRAVENOUS

## 2022-09-15 MED ORDER — SODIUM CHLORIDE 0.9 % IR SOLN
Status: DC | PRN
  Administered 2022-09-15: 1000 mL
  Administered 2022-09-15: 3000 mL

## 2022-09-15 MED ORDER — HYDROCODONE-ACETAMINOPHEN 5-325 MG PO TABS
1.0000 | ORAL_TABLET | ORAL | 0 refills | Status: AC | PRN
  Filled 2022-09-15: qty 42, 7d supply, fill #0

## 2022-09-15 MED ORDER — MORPHINE SULFATE (PF) 2 MG/ML IV SOLN: 0.5000 mg | INTRAVENOUS | Status: DC | PRN

## 2022-09-15 MED ORDER — PROPOFOL 500 MG/50ML IV EMUL
INTRAVENOUS | Status: AC
  Filled 2022-09-15: qty 50

## 2022-09-15 MED ORDER — KETOROLAC TROMETHAMINE 30 MG/ML IJ SOLN
INTRAMUSCULAR | Status: DC | PRN
  Administered 2022-09-15: 30 mg

## 2022-09-15 MED ORDER — MIDAZOLAM HCL 2 MG/2ML IJ SOLN
INTRAMUSCULAR | Status: AC
  Filled 2022-09-15: qty 2

## 2022-09-15 MED ORDER — ACETAMINOPHEN 325 MG PO TABS: 325.0000 mg | ORAL_TABLET | Freq: Four times a day (QID) | ORAL | Status: DC | PRN

## 2022-09-15 MED ORDER — SODIUM CHLORIDE (PF) 0.9 % IJ SOLN
INTRAMUSCULAR | Status: DC | PRN
  Administered 2022-09-15: 30 mL

## 2022-09-15 MED ORDER — CEFAZOLIN SODIUM-DEXTROSE 2-4 GM/100ML-% IV SOLN
2.0000 g | Freq: Four times a day (QID) | INTRAVENOUS | Status: DC
  Administered 2022-09-15: 2 g via INTRAVENOUS

## 2022-09-15 MED ORDER — KETOROLAC TROMETHAMINE 15 MG/ML IJ SOLN: 15.0000 mg | Freq: Four times a day (QID) | INTRAMUSCULAR | Status: DC

## 2022-09-15 MED ORDER — ONDANSETRON HCL 4 MG PO TABS: 4.0000 mg | ORAL_TABLET | Freq: Four times a day (QID) | ORAL | Status: DC | PRN

## 2022-09-15 MED ORDER — ACETAMINOPHEN 500 MG PO TABS
1000.0000 mg | ORAL_TABLET | Freq: Once | ORAL | Status: AC
  Administered 2022-09-15: 1000 mg via ORAL
  Filled 2022-09-15: qty 2

## 2022-09-15 MED ORDER — HYDROCODONE-ACETAMINOPHEN 5-325 MG PO TABS
ORAL_TABLET | ORAL | Status: AC
  Filled 2022-09-15: qty 1

## 2022-09-15 MED ORDER — LACTATED RINGERS IV SOLN: INTRAVENOUS | Status: DC

## 2022-09-15 MED ORDER — ONDANSETRON HCL 4 MG/2ML IJ SOLN: 4.0000 mg | Freq: Once | INTRAMUSCULAR | Status: DC | PRN

## 2022-09-15 MED ORDER — ONDANSETRON HCL 4 MG/2ML IJ SOLN
INTRAMUSCULAR | Status: AC
  Filled 2022-09-15: qty 2

## 2022-09-15 MED ORDER — METHOCARBAMOL 500 MG IVPB - SIMPLE MED: 500.0000 mg | Freq: Four times a day (QID) | INTRAVENOUS | Status: DC | PRN

## 2022-09-15 MED ORDER — DEXAMETHASONE SODIUM PHOSPHATE 10 MG/ML IJ SOLN
INTRAMUSCULAR | Status: DC | PRN
  Administered 2022-09-15: 10 mg via INTRAVENOUS

## 2022-09-15 MED ORDER — TRANEXAMIC ACID-NACL 1000-0.7 MG/100ML-% IV SOLN
1000.0000 mg | INTRAVENOUS | Status: AC
  Administered 2022-09-15: 1000 mg via INTRAVENOUS
  Filled 2022-09-15: qty 100

## 2022-09-15 MED ORDER — HYDROCODONE-ACETAMINOPHEN 7.5-325 MG PO TABS: 1.0000 | ORAL_TABLET | ORAL | Status: DC | PRN

## 2022-09-15 MED ORDER — ISOPROPYL ALCOHOL 70 % SOLN
Status: DC | PRN
  Administered 2022-09-15: 1 via TOPICAL

## 2022-09-15 MED ORDER — HYDROCODONE-ACETAMINOPHEN 5-325 MG PO TABS
1.0000 | ORAL_TABLET | ORAL | Status: DC | PRN
  Administered 2022-09-15: 1 via ORAL

## 2022-09-15 MED ORDER — EPHEDRINE 5 MG/ML INJ
INTRAVENOUS | Status: AC
  Filled 2022-09-15: qty 5

## 2022-09-15 MED ORDER — MIDAZOLAM HCL 2 MG/2ML IJ SOLN
INTRAMUSCULAR | Status: DC | PRN
  Administered 2022-09-15: 2 mg via INTRAVENOUS

## 2022-09-15 MED ORDER — ONDANSETRON HCL 4 MG/2ML IJ SOLN: 4.0000 mg | Freq: Four times a day (QID) | INTRAMUSCULAR | Status: DC | PRN

## 2022-09-15 MED ORDER — FENTANYL CITRATE (PF) 100 MCG/2ML IJ SOLN
INTRAMUSCULAR | Status: AC
  Filled 2022-09-15: qty 2

## 2022-09-15 MED ORDER — CEFAZOLIN SODIUM-DEXTROSE 2-4 GM/100ML-% IV SOLN
2.0000 g | INTRAVENOUS | Status: AC
  Administered 2022-09-15: 2 g via INTRAVENOUS
  Filled 2022-09-15: qty 100

## 2022-09-15 MED ORDER — PROPOFOL 500 MG/50ML IV EMUL
INTRAVENOUS | Status: DC | PRN
  Administered 2022-09-15 (×2): 50 ug/kg/min via INTRAVENOUS

## 2022-09-15 MED ORDER — AMISULPRIDE (ANTIEMETIC) 5 MG/2ML IV SOLN: 10.0000 mg | Freq: Once | INTRAVENOUS | Status: DC | PRN

## 2022-09-15 MED ORDER — ONDANSETRON HCL 4 MG PO TABS
4.0000 mg | ORAL_TABLET | Freq: Three times a day (TID) | ORAL | 0 refills | Status: AC | PRN
  Filled 2022-09-15: qty 30, 10d supply, fill #0

## 2022-09-15 MED ORDER — PROPOFOL 1000 MG/100ML IV EMUL
INTRAVENOUS | Status: AC
  Filled 2022-09-15: qty 100

## 2022-09-15 MED ORDER — CEFAZOLIN SODIUM-DEXTROSE 2-4 GM/100ML-% IV SOLN
INTRAVENOUS | Status: AC
  Filled 2022-09-15: qty 100

## 2022-09-15 MED ORDER — LACTATED RINGERS IV BOLUS
500.0000 mL | Freq: Once | INTRAVENOUS | Status: AC
  Administered 2022-09-15: 500 mL via INTRAVENOUS

## 2022-09-15 MED ORDER — FENTANYL CITRATE (PF) 100 MCG/2ML IJ SOLN
INTRAMUSCULAR | Status: DC | PRN
  Administered 2022-09-15 (×2): 50 ug via INTRAVENOUS

## 2022-09-15 MED ORDER — EPHEDRINE SULFATE (PRESSORS) 50 MG/ML IJ SOLN
INTRAMUSCULAR | Status: DC | PRN
  Administered 2022-09-15 (×2): 5 mg via INTRAVENOUS

## 2022-09-15 MED ORDER — STERILE WATER FOR IRRIGATION IR SOLN
Status: DC | PRN
  Administered 2022-09-15: 2000 mL

## 2022-09-15 MED ORDER — POLYETHYLENE GLYCOL 3350 17 G PO PACK
17.0000 g | PACK | Freq: Every day | ORAL | 0 refills | Status: AC | PRN
  Filled 2022-09-15: qty 14, 14d supply, fill #0

## 2022-09-15 MED ORDER — FENTANYL CITRATE PF 50 MCG/ML IJ SOSY: 25.0000 ug | PREFILLED_SYRINGE | INTRAMUSCULAR | Status: DC | PRN

## 2022-09-15 MED ORDER — SODIUM CHLORIDE (PF) 0.9 % IJ SOLN
INTRAMUSCULAR | Status: AC
  Filled 2022-09-15: qty 50

## 2022-09-15 MED ORDER — BUPIVACAINE-EPINEPHRINE 0.25% -1:200000 IJ SOLN
INTRAMUSCULAR | Status: DC | PRN
  Administered 2022-09-15: 30 mL

## 2022-09-15 MED ORDER — METOCLOPRAMIDE HCL 5 MG PO TABS: 5.0000 mg | ORAL_TABLET | Freq: Three times a day (TID) | ORAL | Status: DC | PRN

## 2022-09-15 MED ORDER — PROPOFOL 10 MG/ML IV BOLUS
INTRAVENOUS | Status: AC
  Filled 2022-09-15: qty 20

## 2022-09-15 MED ORDER — KETOROLAC TROMETHAMINE 30 MG/ML IJ SOLN
INTRAMUSCULAR | Status: AC
  Filled 2022-09-15: qty 1

## 2022-09-15 MED ORDER — ROPIVACAINE HCL 5 MG/ML IJ SOLN
INTRAMUSCULAR | Status: DC | PRN
  Administered 2022-09-15: 20 mL via PERINEURAL

## 2022-09-15 MED ORDER — POVIDONE-IODINE 10 % EX SWAB: 2.0000 | Freq: Once | CUTANEOUS | Status: DC

## 2022-09-15 MED ORDER — DEXAMETHASONE SODIUM PHOSPHATE 10 MG/ML IJ SOLN
INTRAMUSCULAR | Status: DC | PRN
Start: 2022-09-15 — End: 2022-09-15
  Administered 2022-09-15: 10 mg

## 2022-09-15 MED ORDER — METHOCARBAMOL 500 MG PO TABS: 500.0000 mg | ORAL_TABLET | Freq: Four times a day (QID) | ORAL | Status: DC | PRN

## 2022-09-15 MED ORDER — ORAL CARE MOUTH RINSE: 15.0000 mL | Freq: Once | OROMUCOSAL | Status: AC

## 2022-09-15 MED ORDER — ONDANSETRON HCL 4 MG/2ML IJ SOLN
INTRAMUSCULAR | Status: DC | PRN
  Administered 2022-09-15: 4 mg via INTRAVENOUS

## 2022-09-15 MED ORDER — SENNA 8.6 MG PO TABS
2.0000 | ORAL_TABLET | Freq: Every day | ORAL | 0 refills | Status: AC
  Filled 2022-09-15: qty 100, 50d supply, fill #0

## 2022-09-15 MED ORDER — APIXABAN 2.5 MG PO TABS
2.5000 mg | ORAL_TABLET | Freq: Two times a day (BID) | ORAL | 0 refills | Status: DC
  Filled 2022-09-15: qty 60, 30d supply, fill #0

## 2022-09-15 MED ORDER — DEXAMETHASONE SODIUM PHOSPHATE 10 MG/ML IJ SOLN
INTRAMUSCULAR | Status: AC
  Filled 2022-09-15: qty 1

## 2022-09-15 MED ORDER — BUPIVACAINE-EPINEPHRINE 0.25% -1:200000 IJ SOLN
INTRAMUSCULAR | Status: AC
  Filled 2022-09-15: qty 1

## 2022-09-15 SURGICAL SUPPLY — 80 items
ADH SKN CLS APL DERMABOND .7 (GAUZE/BANDAGES/DRESSINGS) ×2
APL PRP STRL LF DISP 70% ISPRP (MISCELLANEOUS) ×2
BAG COUNTER SPONGE SURGICOUNT (BAG) IMPLANT
BAG SPEC THK2 15X12 ZIP CLS (MISCELLANEOUS)
BAG SPNG CNTER NS LX DISP (BAG)
BAG ZIPLOCK 12X15 (MISCELLANEOUS) IMPLANT
BATTERY INSTRU NAVIGATION (MISCELLANEOUS) ×3 IMPLANT
BIT DRILL 2.0X128 (BIT) IMPLANT
BLADE SAW RECIPROCATING 77.5 (BLADE) ×1 IMPLANT
BNDG CMPR 5X4 KNIT ELC UNQ LF (GAUZE/BANDAGES/DRESSINGS) ×1
BNDG CMPR 6 X 5 YARDS HK CLSR (GAUZE/BANDAGES/DRESSINGS) ×1
BNDG ELASTIC 4INX 5YD STR LF (GAUZE/BANDAGES/DRESSINGS) ×1 IMPLANT
BNDG ELASTIC 6INX 5YD STR LF (GAUZE/BANDAGES/DRESSINGS) ×1 IMPLANT
BTRY SRG DRVR LF (MISCELLANEOUS) ×3
CEMENT BONE REFOBACIN R1X40 US (Cement) IMPLANT
CHLORAPREP W/TINT 26 (MISCELLANEOUS) ×2 IMPLANT
COMP FEM PS KNEE NRW 7 RT (Joint) ×1 IMPLANT
COMP PATELLA 3 PEG 35 (Joint) IMPLANT
COMPONENT FEM PS KNEE NRW 7 RT (Joint) IMPLANT
COMPONENT PATELLA 3 PEG 35 (Joint) IMPLANT
COVER SURGICAL LIGHT HANDLE (MISCELLANEOUS) ×1 IMPLANT
DERMABOND ADVANCED .7 DNX12 (GAUZE/BANDAGES/DRESSINGS) ×2 IMPLANT
DRAPE SHEET LG 3/4 BI-LAMINATE (DRAPES) ×6 IMPLANT
DRAPE U-SHAPE 47X51 STRL (DRAPES) ×2 IMPLANT
DRSG AQUACEL AG ADV 3.5X10 (GAUZE/BANDAGES/DRESSINGS) ×1 IMPLANT
ELECT BLADE TIP CTD 4 INCH (ELECTRODE) ×1 IMPLANT
ELECT REM PT RETURN 15FT ADLT (MISCELLANEOUS) ×1 IMPLANT
GAUZE SPONGE 4X4 12PLY STRL (GAUZE/BANDAGES/DRESSINGS) ×2 IMPLANT
GLOVE BIO SURGEON STRL SZ7 (GLOVE) ×1 IMPLANT
GLOVE BIO SURGEON STRL SZ8.5 (GLOVE) ×2 IMPLANT
GLOVE BIOGEL PI IND STRL 7.5 (GLOVE) ×1 IMPLANT
GLOVE BIOGEL PI IND STRL 8.5 (GLOVE) ×1 IMPLANT
GOWN SPEC L3 XXLG W/TWL (GOWN DISPOSABLE) ×1 IMPLANT
GOWN STRL REUS W/ TWL XL LVL3 (GOWN DISPOSABLE) ×1 IMPLANT
GOWN STRL REUS W/TWL XL LVL3 (GOWN DISPOSABLE) ×1
HANDPIECE INTERPULSE COAX TIP (DISPOSABLE) ×1
HOLDER FOLEY CATH W/STRAP (MISCELLANEOUS) ×1 IMPLANT
HOOD PEEL AWAY T7 (MISCELLANEOUS) ×6 IMPLANT
IMMOBILIZER KNEE 20 (SOFTGOODS) ×1
IMMOBILIZER KNEE 20 THIGH 36 (SOFTGOODS) IMPLANT
KIT TURNOVER KIT A (KITS) IMPLANT
LINER TIB ASF PS CD/3-9 10 RT (Liner) IMPLANT
MARKER SKIN DUAL TIP RULER LAB (MISCELLANEOUS) ×1 IMPLANT
NDL MAYO 6 CRC TAPER PT (NEEDLE) IMPLANT
NDL SAFETY ECLIP 18X1.5 (MISCELLANEOUS) ×2 IMPLANT
NDL SPNL 18GX3.5 QUINCKE PK (NEEDLE) ×1 IMPLANT
NEEDLE MAYO 6 CRC TAPER PT (NEEDLE) ×1 IMPLANT
NEEDLE SPNL 18GX3.5 QUINCKE PK (NEEDLE) ×1 IMPLANT
NS IRRIG 1000ML POUR BTL (IV SOLUTION) ×1 IMPLANT
PACK TOTAL KNEE CUSTOM (KITS) ×1 IMPLANT
PADDING CAST COTTON 6X4 STRL (CAST SUPPLIES) ×1 IMPLANT
PASSER SUT SWANSON 36MM LOOP (INSTRUMENTS) IMPLANT
PIN DRILL HDLS TROCAR 75 4PK (PIN) IMPLANT
PROTECTOR NERVE ULNAR (MISCELLANEOUS) ×1 IMPLANT
SAW OSC TIP CART 19.5X105X1.3 (SAW) ×1 IMPLANT
SCREW FEMALE HEX FIX 25X2.5 (ORTHOPEDIC DISPOSABLE SUPPLIES) IMPLANT
SEALER BIPOLAR AQUA 6.0 (INSTRUMENTS) ×1 IMPLANT
SET HNDPC FAN SPRY TIP SCT (DISPOSABLE) ×1 IMPLANT
SET PAD KNEE POSITIONER (MISCELLANEOUS) ×1 IMPLANT
SOLUTION PRONTOSAN WOUND 350ML (IRRIGATION / IRRIGATOR) IMPLANT
SPIKE FLUID TRANSFER (MISCELLANEOUS) ×2 IMPLANT
STEM POLY PAT PLY 35M KNEE (Knees) IMPLANT
STEM TIB PS KNEE D 0D RT (Stem) IMPLANT
SUT BROADBAND TAPE 2PK 2.3 (SUTURE) IMPLANT
SUT MNCRL AB 3-0 PS2 18 (SUTURE) ×1 IMPLANT
SUT MON AB 2-0 CT1 36 (SUTURE) ×1 IMPLANT
SUT STRATAFIX PDO 1 14 VIOLET (SUTURE) ×1
SUT STRATFX PDO 1 14 VIOLET (SUTURE) ×1
SUT VIC AB 1 CTX 36 (SUTURE) ×2
SUT VIC AB 1 CTX36XBRD ANBCTR (SUTURE) ×2 IMPLANT
SUT VIC AB 2-0 CT1 27 (SUTURE) ×1
SUT VIC AB 2-0 CT1 TAPERPNT 27 (SUTURE) ×1 IMPLANT
SUTURE STRATFX PDO 1 14 VIOLET (SUTURE) ×1 IMPLANT
SYR 3ML LL SCALE MARK (SYRINGE) ×1 IMPLANT
TASP GUIDE PS KNEE CD 10 DISP (ORTHOPEDIC DISPOSABLE SUPPLIES) IMPLANT
TRAY FOLEY MTR SLVR 16FR STAT (SET/KITS/TRAYS/PACK) IMPLANT
TRAY FOLEY W/BAG SLVR 14FR LF (SET/KITS/TRAYS/PACK) IMPLANT
TUBE SUCTION HIGH CAP CLEAR NV (SUCTIONS) ×1 IMPLANT
WATER STERILE IRR 1000ML POUR (IV SOLUTION) ×2 IMPLANT
WRAP KNEE MAXI GEL POST OP (GAUZE/BANDAGES/DRESSINGS) IMPLANT

## 2022-09-15 NOTE — Anesthesia Procedure Notes (Signed)
Anesthesia Regional Block: Adductor canal block   Pre-Anesthetic Checklist: , timeout performed,  Correct Patient, Correct Site, Correct Laterality,  Correct Procedure, Correct Position, site marked,  Risks and benefits discussed,  Surgical consent,  Pre-op evaluation,  At surgeon's request and post-op pain management  Laterality: Right  Prep: chloraprep       Needles:  Injection technique: Single-shot  Needle Type: Echogenic Needle     Needle Length: 9cm  Needle Gauge: 21     Additional Needles:   Procedures:,,,, ultrasound used (permanent image in chart),,    Narrative:  Start time: 09/15/2022 7:55 AM End time: 09/15/2022 8:02 AM Injection made incrementally with aspirations every 5 mL.  Performed by: Personally  Anesthesiologist: Collene Schlichter, MD  Additional Notes: No pain on injection. No increased resistance to injection. Injection made in 5cc increments.  Good needle visualization.  Patient tolerated procedure well.

## 2022-09-15 NOTE — Anesthesia Postprocedure Evaluation (Signed)
Anesthesia Post Note  Patient: Lori Klein  Procedure(s) Performed: COMPUTER ASSISTED RIGHT TOTAL KNEE ARTHROPLASTY (Right: Knee)     Patient location during evaluation: PACU Anesthesia Type: Spinal and General Level of consciousness: awake and alert Pain management: pain level controlled Vital Signs Assessment: post-procedure vital signs reviewed and stable Respiratory status: spontaneous breathing, nonlabored ventilation, respiratory function stable and patient connected to nasal cannula oxygen Cardiovascular status: blood pressure returned to baseline and stable Postop Assessment: no apparent nausea or vomiting, spinal receding, no headache and no backache Anesthetic complications: no   No notable events documented.  Last Vitals:  Vitals:   09/15/22 1430 09/15/22 1445  BP: 124/74 (!) 121/51  Pulse: 71 77  Resp:    Temp:    SpO2: 100% 100%    Last Pain:  Vitals:   09/15/22 1545  TempSrc:   PainSc: 1                  Collene Schlichter

## 2022-09-15 NOTE — Anesthesia Procedure Notes (Signed)
Spinal  Patient location during procedure: OR Start time: 09/15/2022 8:37 AM End time: 09/15/2022 8:41 AM Reason for block: surgical anesthesia Staffing Performed: anesthesiologist  Anesthesiologist: Collene Schlichter, MD Performed by: Collene Schlichter, MD Authorized by: Collene Schlichter, MD   Preanesthetic Checklist Completed: patient identified, IV checked, risks and benefits discussed, surgical consent, monitors and equipment checked, pre-op evaluation and timeout performed Spinal Block Patient position: sitting Prep: DuraPrep and site prepped and draped Patient monitoring: continuous pulse ox and blood pressure Approach: midline Location: L3-4 Injection technique: single-shot Needle Needle type: Pencan  Needle gauge: 24 G Assessment Events: CSF return Additional Notes Functioning IV was confirmed and monitors were applied. Sterile prep and drape, including hand hygiene, mask and sterile gloves were used. The patient was positioned and the spine was prepped. The skin was anesthetized with lidocaine.  Free flow of clear CSF was obtained prior to injecting local anesthetic into the CSF.  The spinal needle aspirated freely following injection.  The needle was carefully withdrawn.  The patient tolerated the procedure well. Consent was obtained prior to procedure with all questions answered and concerns addressed. Risks including but not limited to bleeding, infection, nerve damage, paralysis, failed block, inadequate analgesia, allergic reaction, high spinal, itching and headache were discussed and the patient wished to proceed.   Arrie Aran, MD

## 2022-09-15 NOTE — Therapy (Incomplete)
OUTPATIENT PHYSICAL THERAPY LOWER EXTREMITY EVALUATION   Patient Name: Lori Klein MRN: [id number] DOB:1965/12/24, 57 y.o., female Today's Date: 09/15/2022   END OF SESSION:   Past Medical History:  Diagnosis Date   Anxiety    Arthritis    Blood dyscrasia    has sickle cell trait   DVT (deep venous thrombosis) (HCC)    Headache    migraine   PONV (postoperative nausea and vomiting)    Pre-diabetes    Pulmonary embolism [facility])    Past Surgical History:  Procedure Laterality Date   CHOLECYSTECTOMY  2008   laparoscopic   IVC FILTER INSERTION N/A 04/12/2022   Procedure: IVC FILTER INSERTION;  Surgeon: Maeola Harman, MD;  Location: [facility] INVASIVE CV LAB;  Service: Cardiovascular;  Laterality: N/A;   IVC VENOGRAPHY N/A 04/12/2022   Procedure: IVC Venography;  Surgeon: Maeola Harman, MD;  Location: [facility] INVASIVE CV LAB;  Service: Cardiovascular;  Laterality: N/A;   KNEE ARTHROPLASTY Left 04/21/2022   Procedure: COMPUTER ASSISTED TOTAL KNEE ARTHROPLASTY;  Surgeon: Samson Frederic, MD;  Location: [facility] ORS;  Service: Orthopedics;  Laterality: Left;   ulnar surgery Left 2017   released compressed nerve   WRIST SURGERY Right    nerve de fragmentation   Patient Active Problem List   Diagnosis Date Noted   Osteoarthritis of right knee 09/15/2022   Osteoarthritis of left knee 04/21/2022   Bilateral pulmonary embolism (HCC) 09/27/2015   Acute deep vein thrombosis (DVT) of right lower extremity (HCC) 09/27/2015    PCP: Tiana Loft, PA-C   REFERRING PROVIDER: Clois Dupes, PA-C   REFERRING DIAG: *** [phone] (ICD-10-CM) - S/P TKR (total knee replacement)   THERAPY DIAG:  No diagnosis found.  RATIONALE FOR EVALUATION AND TREATMENT: Rehabilitation  ONSET DATE: 09/15/2022 - R TKA  NEXT MD VISIT: ***   SUBJECTIVE:                                                                                                                                                                                                          SUBJECTIVE STATEMENT: ***  PAIN: Are you having pain? {OPRCPAIN:27236}  PERTINENT HISTORY:  ***Chronic B knee OA, s/p R TKA 09/15/22 & L TKA 04/21/22; anxiety; h/o DVT and B PE 2017; IVC filter insertion 04/12/22  PRECAUTIONS: {Therapy precautions:24002}  RED FLAGS: {PT Red Flags:29287}  WEIGHT BEARING RESTRICTIONS: {Yes ***/No:24003}  FALLS:  Has patient fallen in last 6 months? 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  PATIENT SURVEYS:  LEFS ***  COGNITION: Overall cognitive status: {cognition:24006}    SENSATION: {sensation:27233}  EDEMA:  {edema:24020}  MUSCLE LENGTH: Hamstrings: Right *** deg; Left *** deg Maisie Fus test: Right *** deg; Left *** deg Hamstrings: *** ITB: *** Piriformis: *** Hip flexors: *** Quads: *** Heelcord: ***  POSTURE:  {posture:25561}  PALPATION: ***  LOWER EXTREMITY ROM: R knee pre-op AROM = -12-120 (as of PT episode s/p L TKA)  L knee post-rehab following L TKA 0-120 (05/24/22)  {AROM/PROM:27142} ROM Right eval Left   eval  Hip flexion    Hip extension    Hip abduction    Hip adduction    Hip internal rotation    Hip external rotation    Knee flexion    Knee extension    Ankle dorsiflexion    Ankle plantarflexion    Ankle inversion    Ankle eversion     {AROM/PROM:27142} ROM Right eval Left   eval  Hip flexion    Hip  extension    Hip abduction    Hip adduction    Hip internal rotation    Hip external rotation    Knee flexion    Knee extension    Ankle dorsiflexion    Ankle plantarflexion    Ankle inversion    Ankle eversion    (Blank rows = not tested)  LOWER EXTREMITY MMT:  MMT Right eval Left   eval  Hip flexion    Hip extension    Hip abduction    Hip adduction    Hip internal rotation    Hip external rotation    Knee flexion    Knee extension    Ankle dorsiflexion    Ankle plantarflexion    Ankle inversion    Ankle eversion     (Blank rows = not tested)  LOWER EXTREMITY SPECIAL TESTS:  {LEspecialtests:26242}  FUNCTIONAL TESTS:  5 times sit to stand: *** Timed up and go (TUG): ***  GAIT: Distance walked: *** Assistive device utilized: {Assistive devices:23999} Level of assistance: {Levels of assistance:24026} Gait pattern: {gait characteristics:25376} Comments: ***   TODAY'S TREATMENT:  ***   PATIENT EDUCATION:  Education details: {Education details:27468}  Person educated: {Person educated:25204} Education method: {Education Method:25205} Education comprehension: {Education Comprehension:25206}  HOME EXERCISE PROGRAM: ***   ASSESSMENT:  CLINICAL IMPRESSION: Lori Klein is a 57 y.o. female who was seen today for physical therapy evaluation and treatment s/p R TKA on 09/15/2022. ***   OBJECTIVE IMPAIRMENTS: {opptimpairments:25111}.   ACTIVITY LIMITATIONS: {activitylimitations:27494}  PARTICIPATION LIMITATIONS: {participationrestrictions:25113}  PERSONAL FACTORS: {Personal factors:25162} are also affecting patient's functional outcome.   REHAB POTENTIAL: {rehabpotential:25112}  CLINICAL DECISION MAKING: {clinical decision making:25114}  EVALUATION COMPLEXITY: {Evaluation complexity:25115}   GOALS: Goals reviewed with patient? {yes/no:20286}  SHORT TERM GOALS: Target date: ***  Patient will be independent with initial HEP. Baseline:  *** Goal status: {GOALSTATUS:25110}  2.  *** Baseline: *** Goal status: {GOALSTATUS:25110}  3.  *** Baseline: *** Goal status: {GOALSTATUS:25110}   LONG TERM GOALS: Target date: ***  Patient will be independent with advanced/ongoing HEP  to improve outcomes and carryover.  Baseline: *** Goal status: {GOALSTATUS:25110}  2.  Patient will report at least 75% improvement in *** knee pain to improve QOL. Baseline: *** Goal status: {GOALSTATUS:25110}  3.  Patient will demonstrate improved *** knee AROM to >/= ***-*** deg to allow for normal gait and stair mechanics. Baseline: *** Goal status: {GOALSTATUS:25110}  4.  Patient will demonstrate improved *** strength to >/= ***/5 for improved stability and ease of mobility. Baseline: *** Goal status: {GOALSTATUS:25110}  5.  Patient will be able to ambulate 600' with LRAD and normal gait pattern without increased pain to access community.  Baseline: *** Goal status: {GOALSTATUS:25110}  6. Patient will be able to ascend/descend stairs with 1 HR and reciprocal step pattern safely to access home and community.  Baseline: *** Goal status: {GOALSTATUS:25110}  7.  Patient will report *** on *** (patient reported outcome measure) to demonstrate improved functional ability. Baseline: *** Goal status: {GOALSTATUS:25110}  8.  Patient will demonstrate at least 19/24 on DGI to decrease risk of falls. Baseline: *** Goal status: {GOALSTATUS:25110}   9.  *** Baseline: *** Goal status: {GOALSTATUS:25110}   PLAN:  PT FREQUENCY: {rehab frequency:25116}  PT DURATION: {rehab duration:25117}  PLANNED INTERVENTIONS: {rehab planned interventions:25118::"Therapeutic exercises","Therapeutic activity","Neuromuscular re-education","Balance training","Gait training","Patient/Family education","Self Care","Joint mobilization"}  PLAN FOR NEXT SESSION: ***   Marry Guan, PT 09/15/2022, 3:20 PM

## 2022-09-15 NOTE — Interval H&P Note (Signed)
History and Physical Interval Note:  09/15/2022 8:16 AM  Lori Klein  has presented today for surgery, with the diagnosis of Right knee osteoarthritis.  The various methods of treatment have been discussed with the patient and family. After consideration of risks, benefits and other options for treatment, the patient has consented to  Procedure(s): COMPUTER ASSISTED TOTAL KNEE ARTHROPLASTY (Right) as a surgical intervention.  The patient's history has been reviewed, patient examined, no change in status, stable for surgery.  I have reviewed the patient's chart and labs.  Questions were answered to the patient's satisfaction.     Iline Oven 

## 2022-09-15 NOTE — Discharge Instructions (Signed)
 Dr. Brian Swinteck Total Joint Specialist [facility] [address] [place], [place] [address] [phone]  TOTAL KNEE REPLACEMENT POSTOPERATIVE DIRECTIONS    Knee Rehabilitation, Guidelines Following Surgery  Results after knee surgery are often greatly improved when you follow the exercise, range of motion and muscle strengthening exercises prescribed by your doctor. Safety measures are also important to protect the knee from further injury. Any time any of these exercises cause you to have increased pain or swelling in your knee joint, decrease the amount until you are comfortable again and slowly increase them. If you have problems or questions, call your caregiver or physical therapist for advice.   WEIGHT BEARING Weight bearing as tolerated with assist device (walker, cane, etc) as directed, use it as long as suggested by your surgeon or therapist, typically at least 4-6 weeks.  HOME CARE INSTRUCTIONS  Remove items at home which could result in a fall. This includes throw rugs or furniture in walking pathways.  Continue medications as instructed at time of discharge. You may have some home medications which will be placed on hold until you complete the course of blood thinner medication.  You may start showering once you are discharged home but do not submerge the incision under water. Just pat the incision dry and apply a dry gauze dressing on daily. Walk with walker as instructed.  You may resume a sexual relationship in one month or when given the OK by your doctor.  Use walker as long as suggested by your caregivers. Avoid periods of inactivity such as sitting longer than an hour when not asleep. This helps prevent blood clots.  You may put full weight on your legs and walk as much as is comfortable.  You may return to work once you are cleared by your doctor.  Do not drive a car for 6 weeks or until released by you surgeon.  Do not drive while  taking narcotics.  Wear the elastic stockings for three weeks following surgery during the day but you may remove then at night. Make sure you keep all of your appointments after your operation with all of your doctors and caregivers. You should call the office at the above phone number and make an appointment for approximately two weeks after the date of your surgery. Do not remove your surgical dressing. The dressing is waterproof; you may take showers in 3 days, but do not take tub baths or submerge the dressing. Please pick up a stool softener and laxative for home use as long as you are requiring pain medications. ICE to the affected knee every three hours for 30 minutes at a time and then as needed for pain and swelling.  Continue to use ice on the knee for pain and swelling from surgery. You may notice swelling that will progress down to the foot and ankle.  This is normal after surgery.  Elevate the leg when you are not up walking on it.   It is important for you to complete the blood thinner medication as prescribed by your doctor. Continue to use the breathing machine which will help keep your temperature down.  It is common for your temperature to cycle up and down following surgery, especially at night when you are not up moving around and exerting yourself.  The breathing machine keeps your lungs expanded and your temperature down.  RANGE OF MOTION AND STRENGTHENING EXERCISES  Rehabilitation of the knee is important following a knee injury or an   operation. After just a few days of immobilization, the muscles of the thigh which control the knee become weakened and shrink (atrophy). Knee exercises are designed to build up the tone and strength of the thigh muscles and to improve knee motion. Often times heat used for twenty to thirty minutes before working out will loosen up your tissues and help with improving the range of motion but do not use heat for the first two weeks following surgery.  These exercises can be done on a training (exercise) mat, on the floor, on a table or on a bed. Use what ever works the best and is most comfortable for you Knee exercises include:  Leg Lifts - While your knee is still immobilized in a splint or cast, you can do straight leg raises. Lift the leg to 60 degrees, hold for 3 sec, and slowly lower the leg. Repeat 10-20 times 2-3 times daily. Perform this exercise against resistance later as your knee gets better.  Quad and Hamstring Sets - Tighten up the muscle on the front of the thigh (Quad) and hold for 5-10 sec. Repeat this 10-20 times hourly. Hamstring sets are done by pushing the foot backward against an object and holding for 5-10 sec. Repeat as with quad sets.  A rehabilitation program following serious knee injuries can speed recovery and prevent re-injury in the future due to weakened muscles. Contact your doctor or a physical therapist for more information on knee rehabilitation.   POST-OPERATIVE OPIOID TAPER INSTRUCTIONS: It is important to wean off of your opioid medication as soon as possible. If you do not need pain medication after your surgery it is ok to stop day one. Opioids include: Codeine, Hydrocodone(Norco, Vicodin), Oxycodone(Percocet, oxycontin) and hydromorphone amongst others.  Long term and even short term use of opiods can cause: Increased pain response Dependence Constipation Depression Respiratory depression And more.  Withdrawal symptoms can include Flu like symptoms Nausea, vomiting And more Techniques to manage these symptoms Hydrate well Eat regular healthy meals Stay active Use relaxation techniques(deep breathing, meditating, yoga) Do Not substitute Alcohol to help with tapering If you have been on opioids for less than two weeks and do not have pain than it is ok to stop all together.  Plan to wean off of opioids This plan should start within one week post op of your joint replacement. Maintain the same  interval or time between taking each dose and first decrease the dose.  Cut the total daily intake of opioids by one tablet each day Next start to increase the time between doses. The last dose that should be eliminated is the evening dose.    SKILLED REHAB INSTRUCTIONS: If the patient is transferred to a skilled rehab facility following release from the hospital, a list of the current medications will be sent to the facility for the patient to continue.  When discharged from the skilled rehab facility, please have the facility set up the patient's Home Health Physical Therapy prior to being released. Also, the skilled facility will be responsible for providing the patient with their medications at time of release from the facility to include their pain medication, the muscle relaxants, and their blood thinner medication. If the patient is still at the rehab facility at time of the two week follow up appointment, the skilled rehab facility will also need to assist the patient in arranging follow up appointment in our office and any transportation needs.  MAKE SURE YOU:  Understand these instructions.  Will watch   your condition.  Will get help right away if you are not doing well or get worse.    Pick up stool softner and laxative for home use following surgery while on pain medications. Do NOT remove your dressing. You may shower.  Do not take tub baths or submerge incision under water. May shower starting three days after surgery. Please use a clean towel to pat the incision dry following showers. Continue to use ice for pain and swelling after surgery. Do not use any lotions or creams on the incision until instructed by your surgeon.  

## 2022-09-15 NOTE — Transfer of Care (Signed)
Immediate Anesthesia Transfer of Care Note  Patient: Lori Klein  Procedure(s) Performed: COMPUTER ASSISTED RIGHT TOTAL KNEE ARTHROPLASTY (Right: Knee)  Patient Location: PACU  Anesthesia Type:General  Level of Consciousness: awake, alert , and oriented  Airway & Oxygen Therapy: Patient Spontanous Breathing and Patient connected to face mask oxygen  Post-op Assessment: Report given to RN and Post -op Vital signs reviewed and stable  Post vital signs: Reviewed and stable  Last Vitals:  Vitals Value Taken Time  BP 117/75 09/15/22 1145  Temp    Pulse 66 09/15/22 1145  Resp 17 09/15/22 1145  SpO2 100 % 09/15/22 1145  Vitals shown include unfiled device data.  Last Pain:  Vitals:   09/15/22 0645  TempSrc:   PainSc: 5       Patients Stated Pain Goal: 4 (09/15/22 0645)  Complications: No notable events documented.

## 2022-09-15 NOTE — Op Note (Signed)
OPERATIVE REPORT  SURGEON: Samson Frederic, MD   ASSISTANT: Clint Bolder, PA-C  PREOPERATIVE DIAGNOSIS: Primary Right knee arthritis.   POSTOPERATIVE DIAGNOSIS: Primary Right knee arthritis.   PROCEDURE: Computer assisted Right total knee arthroplasty.   IMPLANTS: Zimmer Persona PPS Cementless CR femur, size 7 Narrow. Persona 0 degree Spiked Keel OsseoTi Tibia, size D. Vivacit-E polyethelyene insert, size 10 mm, CR. Cemented 3-Peg patella, size 35 mm. Refobacin R bone cement. #5 Broadband Tape x2.  ANESTHESIA:  MAC, General, Regional, and Spinal  TOURNIQUET TIME: Not utilized.   ESTIMATED BLOOD LOSS:-250 mL    ANTIBIOTICS: 2g Ancef.  DRAINS: None.  COMPLICATIONS: Nondisplaced vertical patella fracture.   CONDITION: PACU - hemodynamically stable.   BRIEF CLINICAL NOTE: Lori Klein is a 57 y.o. female with a long-standing history of Right knee arthritis. After failing conservative management, the patient was indicated for total knee arthroplasty. The risks, benefits, and alternatives to the procedure were explained, and the patient elected to proceed.  PROCEDURE IN DETAIL: Adductor canal block was obtained in the pre-op holding area. Once inside the operative room, spinal anesthesia was obtained, and a foley catheter was inserted. The patient was then positioned and the lower extremity was prepped and draped in the normal sterile surgical fashion.  A time-out was called verifying side and site of surgery. The patient received IV antibiotics within 60 minutes of beginning the procedure. A tourniquet was not utilized.   An anterior approach to the knee was performed utilizing a midvastus arthrotomy. A medial release was performed and the patellar fat pad was excised. Stryker imageless navigation was used to cut the distal femur perpendicular to the mechanical axis. The patella measured  22 mm thick, and a freehand patellar resection was performed to 14 mm of remaining remnant. The patella was sized and prepared with 3 lug holes. A protector plate was placed.  Nagivation was used to make a neutral proximal tibia resection, taking 9 mm of bone from the less affected lateral side with 3 degrees of slope. The menisci were excised. A spacer block was placed, and the alignment and balance in extension were confirmed.   The distal femur was sized using the 3-degree external rotation guide referencing the posterior femoral cortex. The appropriate 4-in-1 cutting block was pinned into place. Rotation was checked using Whiteside's line, the epicondylar axis, and then confirmed with a spacer block in flexion. The remaining femoral cuts were performed, taking care to protect the MCL.  The tibia was sized and the trial tray was pinned into place. The remaining trail components were inserted. The knee was stable to varus and valgus stress through a full range of motion. The patella tracked centrally, and the PCL was well balanced. The trial components were removed, and the proximal tibial surface was prepared. Final tibial and femoral components were impacted into place. The poly liner was placed. Upon removing the patellar plate, there was a nondisplaced vertical patellar fracture. I placed a lane clamp to prevent displacement, and I elected to cement the patellar component. A 2 mm drill bit was used to create 2 bone tunnels in the patellar remnant, and I passed a #5 Broadband suture in a figure of 8 fashion. Once the cement polymerized, the knee was tested for a final time and found to be well balanced.   The wound was copiously irrigated with Prontosan solution and normal saline using pulse lavage.  Marcaine solution was injected into the periarticular soft tissue.  The Broadband suture was tightened.  I placed an additional Broadband cerclage sure. The wound was closed in layers using #1 Vicryl and  Stratafix for the fascia, 2-0 Vicryl for the subcutaneous fat, 2-0 Monocryl for the deep dermal layer, 3-0 running Monocryl subcuticular Stitch, and 4-0 Monocryl stay sutures at both ends of the wound. Dermabond was applied to the skin.  Once the glue was fully dried, an Aquacell Ag and compressive dressing were applied.  A knee immobilizer was placed. The patient was transported to the recovery room in stable condition.  Sponge, needle, and instrument counts were correct at the end of the case x2.  The patient tolerated the procedure well and there were no known complications.  The aquamantis was utilized for this case to help facilitate better hemostasis as patient was felt to be at increased risk of bleeding because of complex case requiring increased OR time and/or exposure. -minimally invasive approach.  A oscillating saw tip was utilized for this case to prevent damage to the soft tissue structures such as muscles, ligaments and tendons, and to ensure accurate bone cuts. This patient was at increased risk for above structures due to  minimally invasive approach.  Please note that a surgical assistant was a medical necessity for this procedure in order to perform it in a safe and expeditious manner. Surgical assistant was necessary to retract the ligaments and vital neurovascular structures to prevent injury to them and also necessary for proper positioning of the limb to allow for anatomic placement of the prosthesis.

## 2022-09-15 NOTE — Progress Notes (Signed)
Confirmed with Dr. Linna Caprice at 13:20 pt is weight bearing as tolerated, with knee immobilizer in place at all times until follow up. PT aware.

## 2022-09-15 NOTE — Evaluation (Signed)
Physical Therapy Evaluation Patient Details Name: Lori Klein MRN: [id number] DOB: 03-12-1965 Today's Date: 09/15/2022  History of Present Illness  57 yo female presents to therapy s/p R TKA on 09/15/2022 due to failure of conservative measures. During the course of surgical procedure today it was noted that pt had sustained R patella fracture and orthopedic recommends R LE WBAT in KI at all times, no active or passive R knee flexion and to follow up in 2 wks. Pt has PMH including but not limited to: DVT, PE, blood dyscrasia, IVC filter (3/112024), R wrist sx and L TKA on 04/21/2022.  Clinical Impression    Lori Klein is a 57 y.o. female POD 0 s/p R TKA. Patient reports IND with mobility at baseline. Patient is now limited by functional impairments (see PT problem list below) and requires CGA for transfers and gait with RW. Patient was able to ambulate 45 feet with RW and CGA and cues for safe walker management. Patient educated on safe sequencing for stair mobility, fall risk prevention, KI management, placement and importance of assessing skin integrity as well as MD recommendations for wear schedule, car transfers, pain management and goal, use of iceman and no active or passive R knee flexion with OPPT to be delayed until after follow up with orthopedic pt and spouse verbalized understanding of safe guarding position for people assisting with mobility. Patient instructed in exercises to facilitate ROM and circulation reviewed and HO provided. Patient will benefit from continued skilled PT interventions to address impairments and progress towards PLOF. Patient has met mobility goals at adequate level for discharge home with family support; will continue to follow if pt continues acute stay to progress towards Mod I goals.       If plan is discharge home, recommend the following: A little help with walking and/or transfers;A little help with bathing/dressing/bathroom;Assistance with  cooking/housework;Assist for transportation;Help with stairs or ramp for entrance   Can travel by private vehicle        Equipment Recommendations None recommended by PT  Recommendations for Other Services       Functional Status Assessment Patient has had a recent decline in their functional status and demonstrates the ability to make significant improvements in function in a reasonable and predictable amount of time.     Precautions / Restrictions Precautions Precautions: Fall;Knee Precaution Comments: no active or passive R knee flexion Required Braces or Orthoses: Knee Immobilizer - Right      Mobility  Bed Mobility Overal bed mobility: Needs Assistance Bed Mobility: Supine to Sit     Supine to sit: Contact guard     General bed mobility comments: min cues for safety and managing R LE with KI off EOB    Transfers Overall transfer level: Needs assistance Equipment used: Rolling walker (2 wheels) Transfers: Sit to/from Stand Sit to Stand: Contact guard assist           General transfer comment: cues for safety and technique  with R LE in extension    Ambulation/Gait Ambulation/Gait assistance: Contact guard assist Gait Distance (Feet): 45 Feet Assistive device: Rolling walker (2 wheels) Gait Pattern/deviations: Step-to pattern, Antalgic Gait velocity: decreased     General Gait Details: R KI donned absent R knee fleixon in swing phase and pt compendating wtih L hip hike  Stairs Stairs: Yes Stairs assistance: Contact guard assist Stair Management: One rail Left Number of Stairs: 3 General stair comments: step navigation assessed with B handrail and R KI prior  to progressing to L handrail only with CGA, cues for safety, sequencing, technique with R LE circumduction and PT recommended pt may benefit from use of L handrail and R UE SPC  Wheelchair Mobility     Tilt Bed    Modified Rankin (Stroke Patients Only)       Balance Overall balance  assessment: Mild deficits observed, not formally tested                                           Pertinent Vitals/Pain Pain Assessment Pain Assessment: 0-10 Pain Score: 3  Pain Location: R LE Pain Descriptors / Indicators: Aching, Constant, Discomfort, Operative site guarding Pain Intervention(s): Limited activity within patient's tolerance, Monitored during session, Premedicated before session, Repositioned    Home Living Family/patient expects to be discharged to:: Private residence Living Arrangements: Spouse/significant other Available Help at Discharge: Family Type of Home: House Home Access: Stairs to enter Entrance Stairs-Rails: None Entrance Stairs-Number of Steps: 1 Alternate Level Stairs-Number of Steps: 15 Home Layout: Two level;Bed/bath upstairs Home Equipment: Shower seat - built Magazine features editor (2 wheels) Additional Comments: iceman machine    Prior Function Prior Level of Function : Independent/Modified Independent;Driving;Working/employed             Mobility Comments: IND with ADLs, self care tasks, IADLs no AD       Extremity/Trunk Assessment        Lower Extremity Assessment Lower Extremity Assessment: RLE deficits/detail RLE Deficits / Details: ankle DF/PF 5/5 and SLR , 10 degrees however assessed with KI donned RLE Sensation: WNL    Cervical / Trunk Assessment Cervical / Trunk Assessment: Normal  Communication   Communication Communication: No apparent difficulties  Cognition Arousal: Alert Behavior During Therapy: WFL for tasks assessed/performed Overall Cognitive Status: Within Functional Limits for tasks assessed                                          General Comments      Exercises Total Joint Exercises Ankle Circles/Pumps: AROM, Both, 15 reps Quad Sets: AROM, Right, 5 reps Hip ABduction/ADduction: AROM, Right, 5 reps, Supine Straight Leg Raises: AROM, Right, 5 reps    Assessment/Plan    PT Assessment Patient needs continued PT services  PT Problem List Decreased strength;Decreased range of motion;Decreased activity tolerance;Decreased balance;Decreased mobility;Pain       PT Treatment Interventions DME instruction;Gait training;Stair training;Functional mobility training;Therapeutic activities;Therapeutic exercise;Balance training;Neuromuscular re-education;Patient/family education;Modalities    PT Goals (Current goals can be found in the Care Plan section)  Acute Rehab PT Goals Patient Stated Goal: to be able to travel PT Goal Formulation: With patient Time For Goal Achievement: 09/29/22 Potential to Achieve Goals: Good    Frequency 7X/week     Co-evaluation               AM-PAC PT "6 Clicks" Mobility  Outcome Measure Help needed turning from your back to your side while in a flat bed without using bedrails?: A Little Help needed moving from lying on your back to sitting on the side of a flat bed without using bedrails?: A Little Help needed moving to and from a bed to a chair (including a wheelchair)?: A Little Help needed standing up from a chair using your arms (e.g., wheelchair or  bedside chair)?: A Little Help needed to walk in hospital room?: A Little Help needed climbing 3-5 steps with a railing? : A Little 6 Click Score: 18    End of Session Equipment Utilized During Treatment: Gait belt Activity Tolerance: Patient tolerated treatment well Patient left: in chair;with call bell/phone within reach;with family/visitor present Nurse Communication: Mobility status;Other (comment) (pt readiness for d/c from PT standpoint) PT Visit Diagnosis: Unsteadiness on feet (R26.81);Other abnormalities of gait and mobility (R26.89);Muscle weakness (generalized) (M62.81);Difficulty in walking, not elsewhere classified (R26.2);Pain Pain - Right/Left: Right Pain - part of body: Knee;Leg    Time: 4098-1191 PT Time Calculation (min) (ACUTE  ONLY): 41 min   Charges:   PT Evaluation $PT Eval Low Complexity: 1 Low PT Treatments $Gait Training: 8-22 mins $Therapeutic Exercise: 8-22 mins PT General Charges $$ ACUTE PT VISIT: 1 Visit         Johnny Bridge, PT Acute Rehab   Jacqualyn Posey 09/15/2022, 4:28 PM

## 2022-09-17 ENCOUNTER — Other Ambulatory Visit (HOSPITAL_BASED_OUTPATIENT_CLINIC_OR_DEPARTMENT_OTHER): Payer: Self-pay

## 2022-09-17 MED ORDER — OXYCODONE HCL 5 MG PO TABS
5.0000 mg | ORAL_TABLET | ORAL | 0 refills | Status: DC | PRN
Start: 2022-09-17 — End: 2022-12-23
  Filled 2022-09-17: qty 42, 11d supply, fill #0

## 2022-09-20 ENCOUNTER — Ambulatory Visit: Payer: Federal, State, Local not specified - PPO | Admitting: Physical Therapy

## 2022-09-21 ENCOUNTER — Encounter (HOSPITAL_COMMUNITY): Payer: Self-pay | Admitting: Orthopedic Surgery

## 2022-09-22 ENCOUNTER — Ambulatory Visit: Payer: Federal, State, Local not specified - PPO

## 2022-09-27 ENCOUNTER — Encounter: Payer: Federal, State, Local not specified - PPO | Admitting: Physical Therapy

## 2022-10-05 ENCOUNTER — Encounter: Payer: Self-pay | Admitting: Physical Therapy

## 2022-10-05 ENCOUNTER — Ambulatory Visit: Payer: Federal, State, Local not specified - PPO | Attending: Student | Admitting: Physical Therapy

## 2022-10-05 DIAGNOSIS — R600 Localized edema: Secondary | ICD-10-CM | POA: Diagnosis present

## 2022-10-05 DIAGNOSIS — M25661 Stiffness of right knee, not elsewhere classified: Secondary | ICD-10-CM | POA: Insufficient documentation

## 2022-10-05 DIAGNOSIS — M25561 Pain in right knee: Secondary | ICD-10-CM | POA: Insufficient documentation

## 2022-10-05 DIAGNOSIS — M6281 Muscle weakness (generalized): Secondary | ICD-10-CM | POA: Diagnosis present

## 2022-10-05 DIAGNOSIS — R262 Difficulty in walking, not elsewhere classified: Secondary | ICD-10-CM | POA: Diagnosis present

## 2022-10-05 NOTE — Therapy (Signed)
OUTPATIENT PHYSICAL THERAPY LOWER EXTREMITY EVALUATION   Patient Name: Lori Klein MRN: [id number] DOB:02-Apr-1965, 57 y.o., female Today's Date: 10/05/2022   END OF SESSION:  PT End of Session - 10/05/22 1700     Visit Number 1    Date for PT Re-Evaluation 11/16/22    PT Start Time 1655    PT Stop Time 1800    PT Time Calculation (min) 65 min    Activity Tolerance Patient tolerated treatment well    Behavior During Therapy [facility] for tasks assessed/performed             Past Medical History:  Diagnosis Date   Anxiety    Arthritis    Blood dyscrasia    has sickle cell trait   DVT (deep venous thrombosis) (HCC)    Headache    migraine   PONV (postoperative nausea and vomiting)    Pre-diabetes    Pulmonary embolism (HCC)    Past Surgical History:  Procedure Laterality Date   CHOLECYSTECTOMY  2008   laparoscopic   IVC FILTER INSERTION N/A 04/12/2022   Procedure: IVC FILTER INSERTION;  Surgeon: Maeola Harman, MD;  Location: [facility] INVASIVE CV LAB;  Service: Cardiovascular;  Laterality: N/A;   IVC VENOGRAPHY N/A 04/12/2022   Procedure: IVC Venography;  Surgeon: Maeola Harman, MD;  Location: [facility] INVASIVE CV LAB;  Service: Cardiovascular;  Laterality: N/A;   KNEE ARTHROPLASTY Left 04/21/2022   Procedure: COMPUTER ASSISTED TOTAL KNEE ARTHROPLASTY;  Surgeon: Samson Frederic, MD;  Location: [facility];  Service: Orthopedics;  Laterality: Left;   KNEE ARTHROPLASTY Right 09/15/2022   Procedure: COMPUTER ASSISTED RIGHT TOTAL KNEE ARTHROPLASTY;  Surgeon: Samson Frederic, MD;  Location: [facility];  Service: Orthopedics;  Laterality: Right;   ulnar surgery Left 2017   released compressed nerve   WRIST SURGERY Right    nerve de fragmentation   Patient Active Problem List   Diagnosis Date Noted   Osteoarthritis of right knee 09/15/2022   Osteoarthritis of left knee 04/21/2022   Bilateral pulmonary embolism (HCC) 09/27/2015   Acute deep vein thrombosis (DVT) of  right lower extremity (HCC) 09/27/2015    PCP: Tiana Loft, PA-C   REFERRING PROVIDER: Clois Dupes, PA-C   REFERRING DIAG: [phone] (ICD-10-CM) - S/P TKR (total knee replacement)   THERAPY DIAG:  Stiffness of right knee, not elsewhere classified  Acute pain of right knee  Localized edema  Muscle weakness (generalized)  Difficulty in walking, not elsewhere classified  RATIONALE FOR EVALUATION AND TREATMENT: Rehabilitation  ONSET DATE: 09/15/2022 - R TKA with cemented patella due to intraoperative vertical patellar fracture   NEXT MD VISIT: 10/25/2022   SUBJECTIVE:  SUBJECTIVE STATEMENT: Pt had R TKA on 09/15/22.  She had patellar fracture at the end of surgery and was in knee immobilizer for 3 weeks.  She has been ok'd to start PT, but to wear immobilizer and use walker until she has better quad control.  She has already started her exercises, the ones she could remember from her previous TKA done last March and has been working on quad sets to regain her quad control, and has stopped using the knee immobilizer.  Otherwise doing well, not a lot of pain, but she has had a lot of insomnia with this knee, only sleeping 2-3 hours.  Not from pain, just really uncomfortable.    From post-surgical notes:During the course of surgical procedure today it was noted that pt had sustained R patella fracture and orthopedic recommends R LE WBAT in KI at all times, no active or passive R knee flexion and to follow up in 2 wks.    PAIN: Are you having pain? Yes: NPRS scale: 4/10 Pain location: R knee  Pain description: discomfort/stiff Aggravating factors: being still, worse at night Relieving factors: moving around, exercises  PERTINENT HISTORY:  Chronic B knee OA, s/p R TKA 09/15/22 & L TKA  04/21/22; anxiety; h/o DVT and B PE 2017; IVC filter insertion 04/12/22  PRECAUTIONS: Other: history DVT  RED FLAGS: None  WEIGHT BEARING RESTRICTIONS: No  FALLS:  Has patient fallen in last 6 months? 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  PATIENT SURVEYS:  LEFS 25/80  COGNITION: Overall cognitive status: Within functional limits for tasks assessed    SENSATION: Decreased sensation on medial and lateral sides of incision.   EDEMA:   Right cm Left cm  Patella (midline) 40 38.5  4 in above patella  50 48  4 in below patella 41 40  ankles 21 21    MUSCLE LENGTH: NT  POSTURE:  No Significant postural limitations  PALPATION: No tenderness in calf.  Incision healing well, no tenderness or redness.    LOWER EXTREMITY ROM: L knee post-rehab following L TKA 0-120 (05/24/22)  Active ROM Right eval Left   eval  Knee flexion 80 115  Knee extension -10 0  Ankle dorsiflexion     Passive ROM Right eval Left   eval  Knee flexion 90 115  Knee extension -5 0  Ankle dorsiflexion    (Blank rows = not tested)  LOWER EXTREMITY MMT:  MMT Right eval Left   eval  Hip flexion 5 5  Hip extension 5 5  Hip abduction 5 5  Hip adduction 5 5  Knee flexion 3 5  Knee extension 2+ (8 deg quad lag 5  Ankle dorsiflexion 5 5  Ankle plantarflexion     (Blank rows = not tested)  Edema   FUNCTIONAL TESTS:  5 times  sit to stand: 16.75 seconds with bil UE assist and R leg forward.     GAIT: Distance walked: 64' Assistive device utilized:  Walker - 2 wheeled Level of assistance: Modified independence Gait pattern: step through pattern Comments: decreased gait speed 0.5 m/s   TODAY'S TREATMENT:   10/05/2022 Therapeutic Exercise: to improve strength and mobility.  Demo, verbal and tactile cues throughout for technique. Supine quad sets - pillow under knee for tactile feed back, tactile cues to quad Active Straight Leg Raise with Quad Set- cues to keep knee straight and perform quad set each rep   Supine Heel Slide with Strap -demo only   Supine Hip Abduction   Supine Knee Extension Strengthening -with bolster under knee Seated Long Arc Quad Seated Long Arc Quad with Strap - demo  Seated Quad Set  Seated Knee Flexion AAROM   Vasopneumatic: Gameready at end of session for post session soreness/edema.  10 min, low compression, 34 deg (coldest).    PATIENT EDUCATION:  Education details: PT eval findings, anticipated POC, initial HEP, and scar tissue mobilization  Person educated: Patient Education method: Explanation, Demonstration, Verbal cues, and Handouts Education comprehension: verbalized understanding and returned demonstration  HOME EXERCISE PROGRAM: Access Code: [email or web address] URL: [email or web address] Date: 10/05/2022 Prepared by: Harrie Foreman  Exercises - Supine Quad Set  - 2 x daily - 7 x weekly - 2 sets - 10 reps - Active Straight Leg Raise with Quad Set  - 2 x daily - 7 x weekly - 2 sets - 10 reps - Supine Heel Slide with Strap  - 2 x daily - 7 x weekly - 2 sets - 10 reps - Supine Hip Abduction  - 2 x daily - 7 x weekly - 2 sets - 10 reps - Supine Knee Extension Strengthening  - 2 x daily - 7 x weekly - 2 sets - 10 reps - Seated Long Arc Quad  - 2 x daily - 7 x weekly - 2 sets - 10 reps - Seated Long Arc Quad with Strap  - 2 x daily - 7 x weekly - 1 sets - 5-10 reps -  10 sec hold - Seated Quad Set  - 2 x daily - 7 x weekly - 2 sets - 10 reps - Seated Knee Flexion AAROM  - 2 x daily - 7 x weekly - 2 sets - 10 reps   ASSESSMENT:  CLINICAL IMPRESSION: Lori Klein is a 57 y.o. female who was seen today for physical therapy evaluation and treatment s/p R TKA on 09/15/2022. Despite 3 weeks in immobilizer after surgery, Lori Klein demonstrates 5-90 R knee PROM.  She still has localized edema, poor functional strength with 8 deg quad lag, gait and balance impairment, and poor functional activity tolerance. Lori Klein will benefit from skilled PT services to address all limitations, reduce pain, and reach optimal level of function.  Today reviewed initial HEP focusing on quad activation as well as extension and flexion exercises, scar tissue mobilization.  Vasopneumatic (GameReady) applied at end of session for post-session edema and soreness.    OBJECTIVE IMPAIRMENTS: Abnormal gait, decreased activity tolerance, decreased balance, decreased endurance, decreased mobility, difficulty walking, decreased ROM, decreased strength, increased edema, increased fascial restrictions, impaired sensation, and pain.   ACTIVITY LIMITATIONS: carrying, lifting, bending, sitting, standing, sleeping, stairs, transfers, and locomotion level  PARTICIPATION LIMITATIONS: meal prep, cleaning, laundry, driving, shopping, and community activity  PERSONAL FACTORS: Time since onset of injury/illness/exacerbation and 1-2 comorbidities: Chronic B knee OA, s/p R TKA 09/15/22 & L TKA 04/21/22; anxiety; h/o DVT and B PE 2017; IVC filter insertion 04/12/22  are also affecting patient's functional  outcome.   REHAB POTENTIAL: Excellent  CLINICAL DECISION MAKING: Stable/uncomplicated  EVALUATION COMPLEXITY: Low   GOALS: Goals reviewed with patient? Yes  SHORT TERM GOALS: Target date: 10/26/2022  Patient will be independent with initial HEP. Baseline:  Goal status: INITIAL  2.   Patient will demonstrate full (0) deg R knee extension  Baseline: lacking 5 deg passively  Goal status: INITIAL  3.  Patient will demonstrate 100 deg R knee flexion  Baseline: 90 Goal status: INITIAL   LONG TERM GOALS: Target date: 11/16/2022  Patient will be independent with advanced/ongoing HEP to improve outcomes and carryover.  Baseline:  Goal status: INITIAL  2.  Patient will report at least 75% improvement in R knee pain to improve QOL. Baseline: 4/10 at worst Goal status: INITIAL  3.  Patient will demonstrate improved R knee AROM to >/= 0-115 deg to allow for normal gait and stair mechanics. Baseline: -10-80 Goal status: INITIAL  4.  Patient will demonstrate improved R quad strength to >/= 4+/5 for improved stability and ease of mobility. Baseline: 2+/5 Goal status: INITIAL  5.  Patient will be able to ambulate 600' with LRAD and normal gait pattern without increased pain to access community.  Baseline: using 2WRW Goal status: INITIAL  6. Patient will be able to ascend/descend stairs with 1 HR and reciprocal step pattern safely to access home and community.  Baseline: NT Goal status: INITIAL  7.  Patient will report >40/80 on LEFS to demonstrate improved functional ability. Baseline: 25/80 Goal status: INITIAL  8.  Patient will demonstrate at least 19/24 on DGI to decrease risk of falls. Baseline: NT Goal status: INITIAL   PLAN:  PT FREQUENCY: 2x/week  PT DURATION: 6 weeks  PLANNED INTERVENTIONS: Therapeutic exercises, Therapeutic activity, Neuromuscular re-education, Balance training, Gait training, Patient/Family education, Self Care, Joint mobilization, Stair training, Orthotic/Fit training, Electrical stimulation, Cryotherapy, Vasopneumatic device, Manual therapy, and Re-evaluation  PLAN FOR NEXT SESSION: review and progress HEP, measure ROM weekly, modalities PRN.   Dr. Kathline Magic Knee Replacement Protocol  [ X ] Evaluate and Treat -ONLY AS  INDICATED [ X ] Strengthening [ X ] Stretching [ X ] Soft Tissue Mobilization [ X ] Modalities PRN Other:   Functional Guidelines: Weight Bearing Status: WBAT  Gait: Progression from use of an assistive device to non-assisted ambulation is encouraged immediately post-operatively. Progression is per physical therapist discretion, with the primary goal of avoiding gait deviations.  Emphasis should be placed on extension exercises  Patient should be educated to incorporate extension of the surgical knee in sleeping and sitting positions. (i.e. no pillows under knee when sleeping and leg elevated when sitting)  Increasing range of motion of the surgical knee should be the goal of physical therapy. The patient should achieve 0 degrees of knee extension and 100 degrees of knee flexion by three (3) weeks post-operative.  Golfing is allowed per patient tolerance  Biking is allowed per patient tolerance  Please call [phone] weekly regarding ROM if less than 90 degrees  Please send progress note with patient to all follow-up visits   *PT to start 09/20/22 (PT start delayed due to intraoperative R patella fracture)   Jena Gauss, PT, DPT 10/05/2022, 6:41 PM   [facility] [address]  [address] [place], [place], [address] Phone: [phone]   Fax:  [phone]

## 2022-10-07 ENCOUNTER — Ambulatory Visit: Payer: Federal, State, Local not specified - PPO | Admitting: Physical Therapy

## 2022-10-07 ENCOUNTER — Encounter: Payer: Self-pay | Admitting: Physical Therapy

## 2022-10-07 DIAGNOSIS — M25661 Stiffness of right knee, not elsewhere classified: Secondary | ICD-10-CM

## 2022-10-07 DIAGNOSIS — M25561 Pain in right knee: Secondary | ICD-10-CM

## 2022-10-07 DIAGNOSIS — R600 Localized edema: Secondary | ICD-10-CM

## 2022-10-07 DIAGNOSIS — R262 Difficulty in walking, not elsewhere classified: Secondary | ICD-10-CM

## 2022-10-07 DIAGNOSIS — M6281 Muscle weakness (generalized): Secondary | ICD-10-CM

## 2022-10-07 NOTE — Therapy (Signed)
OUTPATIENT PHYSICAL THERAPY TREATMENT   Patient Name: Lori Klein MRN: [id number] DOB:[birth year], 57 y.o., female Today's Date: 10/07/2022   END OF SESSION:  PT End of Session - 10/07/22 1658     Visit Number 2    Date for PT Re-Evaluation 11/16/22    Authorization Type Federal [employer] - VL: 50 (13 used in prior PT episode)    PT Start Time 1658    PT Stop Time 1753    PT Time Calculation (min) 55 min    Activity Tolerance Patient tolerated treatment well    Behavior During Therapy WFL for tasks assessed/performed             Past Medical History:  Diagnosis Date   Anxiety    Arthritis    Blood dyscrasia    has sickle cell trait   DVT (deep venous thrombosis) (HCC)    Headache    migraine   PONV (postoperative nausea and vomiting)    Pre-diabetes    Pulmonary embolism (HCC)    Past Surgical History:  Procedure Laterality Date   CHOLECYSTECTOMY  2008   laparoscopic   IVC FILTER INSERTION N/A 04/12/2022   Procedure: IVC FILTER INSERTION;  Surgeon: Maeola Harman, MD;  Location: [facility] INVASIVE CV LAB;  Service: Cardiovascular;  Laterality: N/A;   IVC VENOGRAPHY N/A 04/12/2022   Procedure: IVC Venography;  Surgeon: Maeola Harman, MD;  Location: [facility] INVASIVE CV LAB;  Service: Cardiovascular;  Laterality: N/A;   KNEE ARTHROPLASTY Left 04/21/2022   Procedure: COMPUTER ASSISTED TOTAL KNEE ARTHROPLASTY;  Surgeon: Samson Frederic, MD;  Location: [facility];  Service: Orthopedics;  Laterality: Left;   KNEE ARTHROPLASTY Right 09/15/2022   Procedure: COMPUTER ASSISTED RIGHT TOTAL KNEE ARTHROPLASTY;  Surgeon: Samson Frederic, MD;  Location: [facility];  Service: Orthopedics;  Laterality: Right;   ulnar surgery Left 2017   released compressed nerve   WRIST SURGERY Right    nerve de fragmentation   Patient Active Problem List   Diagnosis Date Noted   Osteoarthritis of right knee 09/15/2022   Osteoarthritis of left knee 04/21/2022   Bilateral pulmonary embolism  (HCC) 09/27/2015   Acute deep vein thrombosis (DVT) of right lower extremity (HCC) 09/27/2015    PCP: Tiana Loft, PA-C   REFERRING PROVIDER: Clois Dupes, PA-C   REFERRING DIAG: [phone] (ICD-10-CM) - S/P TKR (total knee replacement)   THERAPY DIAG:  Stiffness of right knee, not elsewhere classified  Acute pain of right knee  Localized edema  Muscle weakness (generalized)  Difficulty in walking, not elsewhere classified  RATIONALE FOR EVALUATION AND TREATMENT: Rehabilitation  ONSET DATE: 09/15/2022 - R TKA with cemented patella due to intraoperative vertical patellar fracture   NEXT MD VISIT: 10/25/2022   SUBJECTIVE:  SUBJECTIVE STATEMENT: Pt reports her knee still feels stiff having been in the KI initially post-op.  EVAL: Pt had R TKA on 09/15/22.  She had patellar fracture at the end of surgery and was in knee immobilizer for 3 weeks.  She has been ok'd to start PT, but to wear immobilizer and use walker until she has better quad control.  She has already started her exercises, the ones she could remember from her previous TKA done last March and has been working on quad sets to regain her quad control, and has stopped using the knee immobilizer.  Otherwise doing well, not a lot of pain, but she has had a lot of insomnia with this knee, only sleeping 2-3 hours.  Not from pain, just really uncomfortable.    PAIN: Are you having pain? Yes: NPRS scale:  4/10 Pain location: R knee  Pain description: discomfort/stiff Aggravating factors: being still, worse at night Relieving factors: moving around, exercises  PERTINENT HISTORY:  Chronic B knee OA, s/p R TKA 09/15/22 & L TKA 04/21/22; anxiety; h/o DVT and B PE 2017; IVC filter insertion 04/12/22  PRECAUTIONS: Other: history  DVT  RED FLAGS: None  WEIGHT BEARING RESTRICTIONS: No  FALLS:  Has patient fallen in last 6 months? 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  PATIENT SURVEYS:  LEFS 25/80  COGNITION: Overall cognitive status: Within functional limits for tasks assessed    SENSATION: Decreased sensation on medial and lateral sides of incision.   EDEMA:   Right cm Left cm  Patella (midline) 40 38.5  4 in above patella  50 48  4 in below patella 41 40  ankles 21 21    MUSCLE LENGTH: NT  POSTURE:  No Significant postural limitations  PALPATION: No tenderness in calf.  Incision healing well, no tenderness or redness.    LOWER EXTREMITY ROM: L knee post-rehab following L TKA 0-120 (05/24/22)  Active ROM Right eval Left   eval  Knee flexion 80 115  Knee extension -10 0  Ankle dorsiflexion     Passive ROM Right eval Left   eval  Knee flexion 90 115  Knee extension -5 0  Ankle dorsiflexion    (Blank rows = not tested)  LOWER EXTREMITY MMT:  MMT Right eval Left   eval  Hip flexion 5 5  Hip extension 5 5  Hip abduction 5 5  Hip adduction 5 5  Knee flexion 3 5  Knee extension 2+ (8 deg quad lag 5  Ankle dorsiflexion 5 5  Ankle plantarflexion     (Blank rows = not tested)  Edema   FUNCTIONAL TESTS:  5 times sit to stand: 16.75 seconds with bil UE assist and R leg forward.     GAIT: Distance walked:  9' Assistive device utilized: Environmental consultant - 2 wheeled Level of assistance: Modified independence Gait pattern: step through pattern Comments: decreased gait speed 0.5 m/s   TODAY'S TREATMENT:   10/07/2022  THERAPEUTIC EXERCISE:  to improve flexibility, strength and mobility.  Demonstration, verbal and tactile cues throughout for technique. Rec bike - rocking/partial revolutions x 6 min Supine R quad sets with towel roll under knee 10 x 5" Supine R quad set + SLR x 10   Supine R heel slide with strap x 10  Supine R hip abd/adduction x 10 Supine R SAQ x 10 Seated R LAQ x 10 Seated R LAQ with strap AAROM for end ROM 10 x 10" Seated R quad set + ankle DF 10 x 5" Seated R heel slide + AAROM knee flexion at end ROM x 10 Standing church pews x 20 Standing R retro step + contralateral arm raise x 20 Standing R TKE into ball on wall 2 x 10 Supine B HS curls with heels on peanut ball 2 x 10 - 2nd set with strap assist for R knee flexion AAROM Supine quad + glute set isometric pressing R heel into peanut ball 2 x 10  GAIT TRAINING: To normalize gait pattern and prepare to wean to LRAD. 120 ft with SPC - good heel strike but slightly limited R hip and knee flexion   MANUAL THERAPY: To promote improved flexibility, improved joint mobility, and increased ROM.  R knee patellar mobs R knee incisional scar mobility   MODALITIES: Game Ready vasopneumatic compression post session to R knee x 10 min, medium compression, 34 to reduce post-exercise pain and swelling/edema   10/05/2022  Therapeutic Exercise: to improve strength and mobility.  Demo, verbal and tactile cues throughout for technique. Supine quad sets - pillow under knee for tactile feed back, tactile cues to quad Active Straight Leg Raise with Quad Set- cues to keep knee straight and perform quad set each rep  Supine Heel Slide with Strap -demo only  Supine Hip Abduction  Supine Knee Extension Strengthening -with bolster under knee Seated  Long Arc Quad Seated Long Arc Quad with Strap - demo  Seated Quad Set  Seated Knee Flexion AAROM   Vasopneumatic: Gameready at end of session for post session soreness/edema.  10 min, low compression, 34 deg (coldest).    PATIENT EDUCATION:  Education details: HEP review and scar tissue mobilization  Person educated: Patient Education method: Explanation, Demonstration, Verbal cues, and MedBridgeGO app updated Education comprehension: verbalized understanding and returned demonstration  HOME EXERCISE PROGRAM: *Access Code: [email or web address] URL: [email or web address] Date: 10/07/2022 Prepared by: Glenetta Hew  Exercises - Supine Quad Set  - 2 x daily - 7 x weekly - 2 sets - 10 reps - Active Straight Leg Raise with Quad Set  - 2 x daily - 7 x weekly - 2 sets - 10 reps - Supine Heel Slide with Strap  - 2 x daily - 7 x weekly - 2 sets - 10 reps - Supine Hip Abduction  - 2 x daily - 7 x weekly - 2 sets - 10 reps - Supine Knee Extension Strengthening  - 2 x daily - 7 x weekly - 2 sets - 10 reps - Seated Long Arc Quad  - 2 x daily - 7 x weekly - 2 sets - 10 reps - Seated Long Arc Quad with Strap  - 2 x daily - 7 x weekly - 1 sets - 5-10 reps - 10 sec hold - Seated Quad Set  - 2 x daily - 7 x weekly - 2 sets - 10 reps - Seated Knee Flexion AAROM  - 2 x daily - 7 x weekly - 2 sets - 10 reps -  Seated Hamstring Stretch with Strap (Mirrored)  - 2 x daily - 7 x weekly - 3 reps - 30 sec hold - [place]  - 1 x daily - 7 x weekly - 2 sets - 10 reps - 3 sec hold - Retro Step (Mirrored)  - 1 x daily - 7 x weekly - 2 sets - 10 reps - 3 sec hold  *Pt using MedBridge GO app  ASSESSMENT:  CLINICAL IMPRESSION: Ritaann reports her knee feels very stiff coming out of the KI but feels like her HEP exercises are going well. Completed brief review of initial HEP providing clarification as well as hints to help with muscle activation. Progressed quad activation exercises adding standing exercises  to promote R knee stability and functional knee extension during gait cycle. Exercises well tolerated with HEP updated to reflect some of progression. Session concluded with GR vasopneumatic compression to reduce post-exercise/activity pain and edema. Estie will benefit from continued skilled PT to address ongoing ROM, strength and balance deficits to improve mobility and activity tolerance with decreased pain interference.   OBJECTIVE IMPAIRMENTS: Abnormal gait, decreased activity tolerance, decreased balance, decreased endurance, decreased mobility, difficulty walking, decreased ROM, decreased strength, increased edema, increased fascial restrictions, impaired sensation, and pain.   ACTIVITY LIMITATIONS: carrying, lifting, bending, sitting, standing, sleeping, stairs, transfers, and locomotion level  PARTICIPATION LIMITATIONS: meal prep, cleaning, laundry, driving, shopping, and community activity  PERSONAL FACTORS: Time since onset of injury/illness/exacerbation and 1-2 comorbidities: Chronic B knee OA, s/p R TKA 09/15/22 & L TKA 04/21/22; anxiety; h/o DVT and B PE 2017; IVC filter insertion 04/12/22  are also affecting patient's functional outcome.   REHAB POTENTIAL: Excellent  CLINICAL DECISION MAKING: Stable/uncomplicated  EVALUATION COMPLEXITY: Low   GOALS: Goals reviewed with patient? Yes  SHORT TERM GOALS: Target date: 10/26/2022  Patient will be independent with initial HEP. Baseline:  Goal status: IN PROGRESS  2.  Patient will demonstrate full (0) deg R knee extension  Baseline: lacking 5 deg passively  Goal status: IN PROGRESS  3.  Patient will demonstrate 100 deg R knee flexion  Baseline: 90 Goal status: IN PROGRESS   LONG TERM GOALS: Target date: 11/16/2022  Patient will be independent with advanced/ongoing HEP to improve outcomes and carryover.  Baseline:  Goal status: IN PROGRESS  2.  Patient will report at least 75% improvement in R knee pain to improve  QOL. Baseline: 4/10 at worst Goal status: IN PROGRESS  3.  Patient will demonstrate improved R knee AROM to >/= 0-115 deg to allow for normal gait and stair mechanics. Baseline: -10-80 Goal status: IN PROGRESS  4.  Patient will demonstrate improved R quad strength to >/= 4+/5 for improved stability and ease of mobility. Baseline: 2+/5 Goal status: IN PROGRESS  5.  Patient will be able to ambulate 600' with LRAD and normal gait pattern without increased pain to access community.  Baseline: using 2WRW Goal status: IN PROGRESS  6. Patient will be able to ascend/descend stairs with 1 HR and reciprocal step pattern safely to access home and community.  Baseline: NT Goal status: IN PROGRESS  7.  Patient will report >40/80 on LEFS to demonstrate improved functional ability. Baseline: 25/80 Goal status: IN PROGRESS  8.  Patient will demonstrate at least 19/24 on DGI to decrease risk of falls. Baseline: NT Goal status: IN PROGRESS   PLAN:  PT FREQUENCY: 2x/week  PT DURATION: 6 weeks  PLANNED INTERVENTIONS: Therapeutic exercises, Therapeutic activity, Neuromuscular re-education, Balance training, Gait training,  Patient/Family education, Self Care, Joint mobilization, Stair training, Orthotic/Fit training, Electrical stimulation, Cryotherapy, Vasopneumatic device, Manual therapy, and Re-evaluation  PLAN FOR NEXT SESSION: review and progress HEP, measure ROM weekly, modalities PRN.   Dr. Kathline Magic Knee Replacement Protocol  [ X ] Evaluate and Treat -ONLY AS INDICATED [ X ] Strengthening [ X ] Stretching [ X ] Soft Tissue Mobilization [ X ] Modalities PRN Other:   Functional Guidelines: Weight Bearing Status: WBAT  Gait: Progression from use of an assistive device to non-assisted ambulation is encouraged immediately post-operatively. Progression is per physical therapist discretion, with the primary goal of avoiding gait deviations.  Emphasis should be placed on extension  exercises  Patient should be educated to incorporate extension of the surgical knee in sleeping and sitting positions. (i.e. no pillows under knee when sleeping and leg elevated when sitting)  Increasing range of motion of the surgical knee should be the goal of physical therapy. The patient should achieve 0 degrees of knee extension and 100 degrees of knee flexion by three (3) weeks post-operative.  Golfing is allowed per patient tolerance  Biking is allowed per patient tolerance  Please call [phone] weekly regarding ROM if less than 90 degrees  Please send progress note with patient to all follow-up visits   *PT to start 09/20/22 (PT start delayed due to intraoperative R patella fracture)   Marry Guan, PT 10/07/2022, 6:27 PM   [facility] [address]  [address] [place], [place], [address] Phone: [phone]   Fax:  [phone]

## 2022-10-08 ENCOUNTER — Ambulatory Visit: Payer: Federal, State, Local not specified - PPO | Admitting: Physical Therapy

## 2022-10-11 ENCOUNTER — Ambulatory Visit: Payer: Federal, State, Local not specified - PPO

## 2022-10-11 DIAGNOSIS — M25661 Stiffness of right knee, not elsewhere classified: Secondary | ICD-10-CM | POA: Diagnosis not present

## 2022-10-11 DIAGNOSIS — M6281 Muscle weakness (generalized): Secondary | ICD-10-CM

## 2022-10-11 DIAGNOSIS — R600 Localized edema: Secondary | ICD-10-CM

## 2022-10-11 DIAGNOSIS — R262 Difficulty in walking, not elsewhere classified: Secondary | ICD-10-CM

## 2022-10-11 DIAGNOSIS — M25561 Pain in right knee: Secondary | ICD-10-CM

## 2022-10-11 NOTE — Therapy (Signed)
OUTPATIENT PHYSICAL THERAPY TREATMENT   Patient Name: Lori Klein MRN: [id number] DOB:[birth year], 57 y.o., female Today's Date: 10/11/2022   END OF SESSION:    Past Medical History:  Diagnosis Date   Anxiety    Arthritis    Blood dyscrasia    has sickle cell trait   DVT (deep venous thrombosis) (HCC)    Headache    migraine   PONV (postoperative nausea and vomiting)    Pre-diabetes    Pulmonary embolism [facility])    Past Surgical History:  Procedure Laterality Date   CHOLECYSTECTOMY  2008   laparoscopic   IVC FILTER INSERTION N/A 04/12/2022   Procedure: IVC FILTER INSERTION;  Surgeon: Maeola Harman, MD;  Location: [facility] INVASIVE CV LAB;  Service: Cardiovascular;  Laterality: N/A;   IVC VENOGRAPHY N/A 04/12/2022   Procedure: IVC Venography;  Surgeon: Maeola Harman, MD;  Location: [facility] INVASIVE CV LAB;  Service: Cardiovascular;  Laterality: N/A;   KNEE ARTHROPLASTY Left 04/21/2022   Procedure: COMPUTER ASSISTED TOTAL KNEE ARTHROPLASTY;  Surgeon: Samson Frederic, MD;  Location: [facility] ORS;  Service: Orthopedics;  Laterality: Left;   KNEE ARTHROPLASTY Right 09/15/2022   Procedure: COMPUTER ASSISTED RIGHT TOTAL KNEE ARTHROPLASTY;  Surgeon: Samson Frederic, MD;  Location: [facility];  Service: Orthopedics;  Laterality: Right;   ulnar surgery Left 2017   released compressed nerve   WRIST SURGERY Right    nerve de fragmentation   Patient Active Problem List   Diagnosis Date Noted   Osteoarthritis of right knee 09/15/2022   Osteoarthritis of left knee 04/21/2022   Bilateral pulmonary embolism (HCC) 09/27/2015   Acute deep vein thrombosis (DVT) of right lower extremity (HCC) 09/27/2015    PCP: Tiana Loft, PA-C   REFERRING PROVIDER: Clois Dupes, PA-C   REFERRING DIAG: [phone] (ICD-10-CM) - S/P TKR (total knee replacement)   THERAPY DIAG:  Stiffness of right knee, not elsewhere classified  Acute pain of right knee  Localized edema  Muscle weakness  (generalized)  Difficulty in walking, not elsewhere classified  RATIONALE FOR EVALUATION AND TREATMENT: Rehabilitation  ONSET DATE: 09/15/2022 - R TKA with cemented patella due to intraoperative vertical patellar fracture   NEXT MD VISIT: 10/25/2022   SUBJECTIVE:                                                                                                                                                                                                         SUBJECTIVE STATEMENT: Pt reports doing good overall, R knee just feels a lot stiffer than the L .  EVAL: Pt had R TKA on 09/15/22.  She had patellar fracture at the end of surgery and was in knee immobilizer for 3 weeks.  She has been ok'd to start PT, but to wear immobilizer and use walker until she has better quad control.  She has already started her exercises, the ones she could remember from her previous TKA done last March and has been working on quad sets to regain her quad control, and has stopped using the knee immobilizer.  Otherwise doing well, not a lot of pain, but she has had a lot of insomnia with this knee, only sleeping 2-3 hours.  Not from pain, just really uncomfortable.    PAIN: Are you having pain? Yes: NPRS scale:  4/10 Pain location: R knee  Pain description: discomfort/stiff Aggravating factors: being still, worse at night Relieving factors: moving around, exercises  PERTINENT HISTORY:  Chronic B knee OA, s/p R TKA 09/15/22 & L TKA 04/21/22; anxiety; h/o DVT and B PE 2017; IVC filter insertion 04/12/22  PRECAUTIONS: Other: history DVT  RED FLAGS: None  WEIGHT BEARING RESTRICTIONS: No  FALLS:  Has patient fallen in last 6 months? 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  PATIENT SURVEYS:  LEFS 25/80  COGNITION: Overall cognitive status: Within functional limits for tasks assessed    SENSATION: Decreased sensation on medial and lateral sides of incision.   EDEMA:   Right cm Left cm  Patella (midline) 40 38.5  4 in above patella  50 48  4 in below patella 41 40  ankles 21 21    MUSCLE LENGTH: NT  POSTURE:  No Significant postural limitations  PALPATION: No tenderness in calf.  Incision healing well, no tenderness or redness.    LOWER EXTREMITY ROM: L knee post-rehab following L TKA 0-120 (05/24/22)  Active ROM Right eval Left   eval  Knee flexion 80 115  Knee extension -10 0  Ankle dorsiflexion     Passive ROM Right eval Left   eval  Knee flexion 90 115  Knee extension -5 0  Ankle dorsiflexion    (Blank rows = not tested)  LOWER EXTREMITY MMT:  MMT Right eval Left   eval  Hip flexion 5 5  Hip extension 5 5  Hip abduction 5 5  Hip adduction 5 5  Knee flexion 3 5  Knee extension 2+ (8 deg quad lag 5  Ankle dorsiflexion 5 5  Ankle plantarflexion     (Blank rows = not tested)  Edema   FUNCTIONAL TESTS:  5 times sit to stand: 16.75 seconds with bil UE assist and R leg forward.     GAIT: Distance walked: 3' Assistive device utilized: Environmental consultant - 2 wheeled Level of assistance: Modified independence Gait pattern: step through pattern Comments: decreased gait speed 0.5 m/s   TODAY'S TREATMENT:  10/11/22 THERAPEUTIC EXERCISE: to improve flexibility, strength and mobility.  Demonstration, verbal and tactile cues throughout for technique. Nustep L5x35min TKE standing back  to wall 2x10  Toe  raises back to wall 2x10 Seated R knee flexion AAROM with strap 2x10 Seated LAQ R 2x10 Supine R QS with pillow under knee 2x10 Seated hamstring stretch with strap 2x30 sec Runner stretch 2x30 sec MANUAL THERAPY: To promote improved flexibility, improved joint mobility, and increased ROM.  R knee patellar mobs  R knee incisional scar mobilization    MODALITIES: Game Ready vasopneumatic compression post session to R knee x 10 min, medium compression, 34 to reduce post-exercise pain and swelling/edema  10/07/2022  THERAPEUTIC EXERCISE: to improve flexibility, strength and mobility.  Demonstration, verbal and tactile cues throughout for technique. Rec bike - rocking/partial revolutions x 6 min Supine R quad sets with towel roll under knee 10 x 5" Supine R quad set + SLR x 10   Supine R heel slide with strap x 10  Supine R hip abd/adduction x 10 Supine R SAQ x 10 Seated R LAQ x 10 Seated R LAQ with strap AAROM for end ROM 10 x 10" Seated R quad set + ankle DF 10 x 5" Seated R heel slide + AAROM knee flexion at end ROM x 10 Standing church pews x 20 Standing R retro step + contralateral arm raise x 20 Standing R TKE into ball on wall 2 x 10 Supine B HS curls with heels on peanut ball 2 x 10 - 2nd set with strap assist for R knee flexion AAROM Supine quad + glute set isometric pressing R heel into peanut ball 2 x 10  GAIT TRAINING: To normalize gait pattern and prepare to wean to LRAD. 120 ft with SPC - good heel strike but slightly limited R hip and knee flexion   MANUAL THERAPY: To promote improved flexibility, improved joint mobility, and increased ROM.  R knee patellar mobs R knee incisional scar mobility   MODALITIES: Game Ready vasopneumatic compression post session to R knee x 10 min, medium compression, 34 to reduce post-exercise pain and swelling/edema   10/05/2022  Therapeutic Exercise: to improve strength and mobility.  Demo, verbal and tactile cues throughout for  technique. Supine quad sets - pillow under knee for tactile feed back, tactile cues to quad Active Straight Leg Raise with Quad Set- cues to keep knee straight and perform quad set each rep  Supine Heel Slide with Strap -demo only  Supine Hip Abduction  Supine Knee Extension Strengthening -with bolster under knee Seated Long Arc Quad Seated Long Arc Quad with Strap - demo  Seated Quad Set  Seated Knee Flexion AAROM   Vasopneumatic: Gameready at end of session for post session soreness/edema.  10 min, low compression, 34 deg (coldest).    PATIENT EDUCATION:  Education details: HEP review and scar tissue mobilization  Person educated: Patient Education method: Explanation, Demonstration, Verbal cues, and MedBridgeGO app updated Education comprehension: verbalized understanding and returned demonstration  HOME EXERCISE PROGRAM: *Access Code: [email or web address] URL: [email or web address] Date: 10/07/2022 Prepared by: Glenetta Hew  Exercises - Supine Quad Set  - 2 x daily - 7 x weekly - 2 sets - 10 reps - Active Straight Leg Raise with Quad Set  - 2 x daily - 7 x weekly - 2 sets - 10 reps - Supine Heel Slide with Strap  - 2 x daily - 7 x weekly - 2 sets - 10 reps - Supine Hip Abduction  - 2 x daily - 7 x weekly - 2 sets - 10 reps - Supine Knee Extension Strengthening  - 2 x daily -  7 x weekly - 2 sets - 10 reps - Seated Long Arc Quad  - 2 x daily - 7 x weekly - 2 sets - 10 reps - Seated Long Arc Quad with Strap  - 2 x daily - 7 x weekly - 1 sets - 5-10 reps - 10 sec hold - Seated Quad Set  - 2 x daily - 7 x weekly - 2 sets - 10 reps - Seated Knee Flexion AAROM  - 2 x daily - 7 x weekly - 2 sets - 10 reps - Seated Hamstring Stretch with Strap (Mirrored)  - 2 x daily - 7 x weekly - 3 reps - 30 sec hold - [place]  - 1 x daily - 7 x weekly - 2 sets - 10 reps - 3 sec hold - Retro Step (Mirrored)  - 1 x daily - 7 x weekly - 2 sets - 10 reps - 3 sec hold  *Pt using MedBridge GO  app  ASSESSMENT:  CLINICAL IMPRESSION: Aryssa reports her knee feels very stiff coming out of the KI. Progressed through exercises as tolerated for knee strengthening and ROM. Quad contraction still weak w/o any feedback behind knee. She is now using cane for ambulation but compensates with increased R hip rotation going through midstance. Added runner stretch to improve knee extension and also educated on propping R LE up with something under L heel for posterior knee stretch. Deshannon will benefit from continued skilled PT to address ongoing ROM, strength and balance deficits to improve mobility and activity tolerance with decreased pain interference.   OBJECTIVE IMPAIRMENTS: Abnormal gait, decreased activity tolerance, decreased balance, decreased endurance, decreased mobility, difficulty walking, decreased ROM, decreased strength, increased edema, increased fascial restrictions, impaired sensation, and pain.   ACTIVITY LIMITATIONS: carrying, lifting, bending, sitting, standing, sleeping, stairs, transfers, and locomotion level  PARTICIPATION LIMITATIONS: meal prep, cleaning, laundry, driving, shopping, and community activity  PERSONAL FACTORS: Time since onset of injury/illness/exacerbation and 1-2 comorbidities: Chronic B knee OA, s/p R TKA 09/15/22 & L TKA 04/21/22; anxiety; h/o DVT and B PE 2017; IVC filter insertion 04/12/22  are also affecting patient's functional outcome.   REHAB POTENTIAL: Excellent  CLINICAL DECISION MAKING: Stable/uncomplicated  EVALUATION COMPLEXITY: Low   GOALS: Goals reviewed with patient? Yes  SHORT TERM GOALS: Target date: 10/26/2022  Patient will be independent with initial HEP. Baseline:  Goal status: IN PROGRESS  2.  Patient will demonstrate full (0) deg R knee extension  Baseline: lacking 5 deg passively  Goal status: IN PROGRESS  3.  Patient will demonstrate 100 deg R knee flexion  Baseline: 90 Goal status: IN PROGRESS   LONG TERM GOALS:  Target date: 11/16/2022  Patient will be independent with advanced/ongoing HEP to improve outcomes and carryover.  Baseline:  Goal status: IN PROGRESS  2.  Patient will report at least 75% improvement in R knee pain to improve QOL. Baseline: 4/10 at worst Goal status: IN PROGRESS  3.  Patient will demonstrate improved R knee AROM to >/= 0-115 deg to allow for normal gait and stair mechanics. Baseline: -10-80 Goal status: IN PROGRESS  4.  Patient will demonstrate improved R quad strength to >/= 4+/5 for improved stability and ease of mobility. Baseline: 2+/5 Goal status: IN PROGRESS  5.  Patient will be able to ambulate 600' with LRAD and normal gait pattern without increased pain to access community.  Baseline: using 2WRW Goal status: IN PROGRESS  6. Patient will be able to ascend/descend stairs  with 1 HR and reciprocal step pattern safely to access home and community.  Baseline: NT Goal status: IN PROGRESS  7.  Patient will report >40/80 on LEFS to demonstrate improved functional ability. Baseline: 25/80 Goal status: IN PROGRESS  8.  Patient will demonstrate at least 19/24 on DGI to decrease risk of falls. Baseline: NT Goal status: IN PROGRESS   PLAN:  PT FREQUENCY: 2x/week  PT DURATION: 6 weeks  PLANNED INTERVENTIONS: Therapeutic exercises, Therapeutic activity, Neuromuscular re-education, Balance training, Gait training, Patient/Family education, Self Care, Joint mobilization, Stair training, Orthotic/Fit training, Electrical stimulation, Cryotherapy, Vasopneumatic device, Manual therapy, and Re-evaluation  PLAN FOR NEXT SESSION: review and progress HEP, measure ROM weekly, modalities PRN.   Dr. Kathline Magic Knee Replacement Protocol  [ X ] Evaluate and Treat -ONLY AS INDICATED [ X ] Strengthening [ X ] Stretching [ X ] Soft Tissue Mobilization [ X ] Modalities PRN Other:   Functional Guidelines: Weight Bearing Status: WBAT  Gait: Progression from use of an  assistive device to non-assisted ambulation is encouraged immediately post-operatively. Progression is per physical therapist discretion, with the primary goal of avoiding gait deviations.  Emphasis should be placed on extension exercises  Patient should be educated to incorporate extension of the surgical knee in sleeping and sitting positions. (i.e. no pillows under knee when sleeping and leg elevated when sitting)  Increasing range of motion of the surgical knee should be the goal of physical therapy. The patient should achieve 0 degrees of knee extension and 100 degrees of knee flexion by three (3) weeks post-operative.  Golfing is allowed per patient tolerance  Biking is allowed per patient tolerance  Please call [phone] weekly regarding ROM if less than 90 degrees  Please send progress note with patient to all follow-up visits   *PT to start 09/20/22 (PT start delayed due to intraoperative R patella fracture)   Darleene Cleaver, PTA 10/11/2022, 5:07 PM   [facility] [address]  [address] [place], [place], [address] Phone: [phone]   Fax:  [phone]

## 2022-10-13 ENCOUNTER — Ambulatory Visit: Payer: Federal, State, Local not specified - PPO

## 2022-10-13 DIAGNOSIS — M25661 Stiffness of right knee, not elsewhere classified: Secondary | ICD-10-CM | POA: Diagnosis not present

## 2022-10-13 DIAGNOSIS — M6281 Muscle weakness (generalized): Secondary | ICD-10-CM

## 2022-10-13 DIAGNOSIS — M25561 Pain in right knee: Secondary | ICD-10-CM

## 2022-10-13 DIAGNOSIS — R262 Difficulty in walking, not elsewhere classified: Secondary | ICD-10-CM

## 2022-10-13 DIAGNOSIS — R600 Localized edema: Secondary | ICD-10-CM

## 2022-10-13 NOTE — Therapy (Signed)
OUTPATIENT PHYSICAL THERAPY TREATMENT   Patient Name: Lori Klein MRN: [id number] DOB:May 09, 1965, 57 y.o., female Today's Date: 10/13/2022   END OF SESSION:  PT End of Session - 10/13/22 1657     Visit Number 4    Date for PT Re-Evaluation 11/16/22    Authorization Type Federal [employer] - VL: 50 (13 used in prior PT episode)    PT Start Time 1652    PT Stop Time 1754    PT Time Calculation (min) 62 min    Activity Tolerance Patient tolerated treatment well    Behavior During Therapy WFL for tasks assessed/performed              Past Medical History:  Diagnosis Date   Anxiety    Arthritis    Blood dyscrasia    has sickle cell trait   DVT (deep venous thrombosis) (HCC)    Headache    migraine   PONV (postoperative nausea and vomiting)    Pre-diabetes    Pulmonary embolism (HCC)    Past Surgical History:  Procedure Laterality Date   CHOLECYSTECTOMY  2008   laparoscopic   IVC FILTER INSERTION N/A 04/12/2022   Procedure: IVC FILTER INSERTION;  Surgeon: Maeola Harman, MD;  Location: [facility] INVASIVE CV LAB;  Service: Cardiovascular;  Laterality: N/A;   IVC VENOGRAPHY N/A 04/12/2022   Procedure: IVC Venography;  Surgeon: Maeola Harman, MD;  Location: [facility] INVASIVE CV LAB;  Service: Cardiovascular;  Laterality: N/A;   KNEE ARTHROPLASTY Left 04/21/2022   Procedure: COMPUTER ASSISTED TOTAL KNEE ARTHROPLASTY;  Surgeon: Samson Frederic, MD;  Location: [facility];  Service: Orthopedics;  Laterality: Left;   KNEE ARTHROPLASTY Right 09/15/2022   Procedure: COMPUTER ASSISTED RIGHT TOTAL KNEE ARTHROPLASTY;  Surgeon: Samson Frederic, MD;  Location: [facility];  Service: Orthopedics;  Laterality: Right;   ulnar surgery Left 2017   released compressed nerve   WRIST SURGERY Right    nerve de fragmentation   Patient Active Problem List   Diagnosis Date Noted   Osteoarthritis of right knee 09/15/2022   Osteoarthritis of left knee 04/21/2022   Bilateral pulmonary  embolism (HCC) 09/27/2015   Acute deep vein thrombosis (DVT) of right lower extremity (HCC) 09/27/2015    PCP: Tiana Loft, PA-C   REFERRING PROVIDER: Clois Dupes, PA-C   REFERRING DIAG: [phone] (ICD-10-CM) - S/P TKR (total knee replacement)   THERAPY DIAG:  Stiffness of right knee, not elsewhere classified  Acute pain of right knee  Localized edema  Muscle weakness (generalized)  Difficulty in walking, not elsewhere classified  RATIONALE FOR EVALUATION AND TREATMENT: Rehabilitation  ONSET DATE: 09/15/2022 - R TKA with cemented patella due to intraoperative vertical patellar fracture   NEXT MD VISIT: 10/25/2022   SUBJECTIVE:  SUBJECTIVE STATEMENT: Pt feels tight over her hip flexors and quads, no pain in her knee though.  EVAL: Pt had R TKA on 09/15/22.  She had patellar fracture at the end of surgery and was in knee immobilizer for 3 weeks.  She has been ok'd to start PT, but to wear immobilizer and use walker until she has better quad control.  She has already started her exercises, the ones she could remember from her previous TKA done last March and has been working on quad sets to regain her quad control, and has stopped using the knee immobilizer.  Otherwise doing well, not a lot of pain, but she has had a lot of insomnia with this knee, only sleeping 2-3 hours.  Not from pain, just really uncomfortable.    PAIN: Are you having pain? Yes: NPRS scale: 0/10 Pain location: R knee  Pain description: discomfort/stiff Aggravating factors: being still, worse at night Relieving factors: moving around, exercises  PERTINENT HISTORY:  Chronic B knee OA, s/p R TKA 09/15/22 & L TKA 04/21/22; anxiety; h/o DVT and B PE 2017; IVC filter insertion 04/12/22  PRECAUTIONS: Other: history  DVT  RED FLAGS: None  WEIGHT BEARING RESTRICTIONS: No  FALLS:  Has patient fallen in last 6 months? 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  PATIENT SURVEYS:  LEFS 25/80  COGNITION: Overall cognitive status: Within functional limits for tasks assessed    SENSATION: Decreased sensation on medial and lateral sides of incision.   EDEMA:   Right cm Left cm  Patella (midline) 40 38.5  4 in above patella  50 48  4 in below patella 41 40  ankles 21 21    MUSCLE LENGTH: NT  POSTURE:  No Significant postural limitations  PALPATION: No tenderness in calf.  Incision healing well, no tenderness or redness.    LOWER EXTREMITY ROM: L knee post-rehab following L TKA 0-120 (05/24/22)  Active ROM Right eval Left   eval Right 10/13/22   Knee flexion 80 115 87- seated  Knee extension -10 0 6- supine  Ankle dorsiflexion      Passive ROM Right eval Left   eval  Knee flexion 90 115  Knee extension -5 0  Ankle dorsiflexion    (Blank rows = not tested)  LOWER EXTREMITY MMT:  MMT Right eval Left   eval  Hip flexion 5 5  Hip extension 5 5  Hip abduction 5 5  Hip adduction 5 5  Knee flexion 3 5  Knee extension 2+ (8 deg quad lag 5  Ankle dorsiflexion 5 5  Ankle plantarflexion     (Blank rows = not tested)  Edema   FUNCTIONAL TESTS:  5 times sit to stand: 16.75 seconds with bil UE assist and R leg  forward.     GAIT: Distance walked: 92' Assistive device utilized: Environmental consultant - 2 wheeled Level of assistance: Modified independence Gait pattern: step through pattern Comments: decreased gait speed 0.5 m/s  TODAY'S TREATMENT:  10/13/22 THERAPEUTIC EXERCISE: to improve flexibility, strength and mobility.  Demonstration, verbal and tactile cues throughout for technique. Nustep L5x4 min UE/LE Recumbent Bike partial rev x 3 min Prone R quad stretch with strap 2x30 sec Seated R knee flexion AAROM with strap 2x12  Standing R knee flexion on 6',8' step x 10 each  Mini squats with UE support 2x10 Runner stretch 2x30 sec R  MANUAL THERAPY: To promote improved flexibility, improved joint mobility, and increased ROM.  IASTM to R VL, ITB with rolling stick  NEUROMUSCULAR RE-EDUCATION: To improve posture, balance, and kinesthesia. Clock balance toe tapping with LLE 1/2way around 4x Retro steps with arm raise 2x10 Toe raise intermittent support x 20   MODALITIES: Game Ready vasopneumatic compression post session to R knee x 10 min, medium compression, 34 to reduce post-exercise pain and swelling/edema  10/11/22 THERAPEUTIC EXERCISE: to improve flexibility, strength and mobility.  Demonstration, verbal and tactile cues throughout for technique. Nustep L5x63min TKE standing back to wall 2x10  Toe raises back to wall 2x10 Seated R knee flexion AAROM with strap 2x10 Seated LAQ R 2x10 Supine R QS with pillow under knee 2x10 Seated hamstring stretch with strap 2x30 sec Runner stretch 2x30 sec MANUAL THERAPY: To promote improved flexibility, improved joint mobility, and increased ROM.  R knee patellar mobs  R knee incisional scar mobilization    MODALITIES: Game Ready vasopneumatic compression post session to R knee x 10 min, medium compression, 34 to reduce post-exercise pain and swelling/edema  10/07/2022  THERAPEUTIC EXERCISE: to improve flexibility, strength and mobility.  Demonstration,  verbal and tactile cues throughout for technique. Rec bike - rocking/partial revolutions x 6 min Supine R quad sets with towel roll under knee 10 x 5" Supine R quad set + SLR x 10   Supine R heel slide with strap x 10  Supine R hip abd/adduction x 10 Supine R SAQ x 10 Seated R LAQ x 10 Seated R LAQ with strap AAROM for end ROM 10 x 10" Seated R quad set + ankle DF 10 x 5" Seated R heel slide + AAROM knee flexion at end ROM x 10 Standing church pews x 20 Standing R retro step + contralateral arm raise x 20 Standing R TKE into ball on wall 2 x 10 Supine B HS curls with heels on peanut ball 2 x 10 - 2nd set with strap assist for R knee flexion AAROM Supine quad + glute set isometric pressing R heel into peanut ball 2 x 10  GAIT TRAINING: To normalize gait pattern and prepare to wean to LRAD. 120 ft with SPC - good heel strike but slightly limited R hip and knee flexion   MANUAL THERAPY: To promote improved flexibility, improved joint mobility, and increased ROM.  R knee patellar mobs R knee incisional scar mobility   MODALITIES: Game Ready vasopneumatic compression post session to R knee x 10 min, medium compression, 34 to reduce post-exercise pain and swelling/edema   10/05/2022  Therapeutic Exercise: to improve strength and mobility.  Demo, verbal and tactile cues throughout for technique. Supine quad sets - pillow under knee for tactile feed back, tactile cues to quad Active Straight Leg Raise with Quad Set- cues to keep knee straight and perform quad set each rep  Supine Heel Slide with Strap -demo only  Supine Hip Abduction  Supine Knee Extension Strengthening -with bolster under knee Seated Long Arc Quad Seated Long Arc Quad with Strap - Community education officer  Set  Seated Knee Flexion AAROM   Vasopneumatic: Gameready at end of session for post session soreness/edema.  10 min, low compression, 34 deg (coldest).    PATIENT EDUCATION:  Education details: HEP update - runner  stretch and quad stretch added   Person educated: Patient Education method: Explanation, Demonstration, Verbal cues, and MedBridgeGO app updated Education comprehension: verbalized understanding and returned demonstration  HOME EXERCISE PROGRAM: *Access Code: [email or web address] URL: [email or web address] Date: 10/13/2022 Prepared by: Verta Ellen  Exercises - Supine Quad Set  - 2 x daily - 7 x weekly - 2 sets - 10 reps - Active Straight Leg Raise with Quad Set  - 2 x daily - 7 x weekly - 2 sets - 10 reps - Supine Heel Slide with Strap  - 2 x daily - 7 x weekly - 2 sets - 10 reps - Supine Hip Abduction  - 2 x daily - 7 x weekly - 2 sets - 10 reps - Supine Knee Extension Strengthening  - 2 x daily - 7 x weekly - 2 sets - 10 reps - Seated Long Arc Quad  - 2 x daily - 7 x weekly - 2 sets - 10 reps - Seated Long Arc Quad with Strap  - 2 x daily - 7 x weekly - 1 sets - 5-10 reps - 10 sec hold - Seated Quad Set  - 2 x daily - 7 x weekly - 2 sets - 10 reps - Seated Knee Flexion AAROM  - 2 x daily - 7 x weekly - 2 sets - 10 reps - Seated Hamstring Stretch with Strap (Mirrored)  - 2 x daily - 7 x weekly - 3 reps - 30 sec hold - [place]  - 1 x daily - 7 x weekly - 2 sets - 10 reps - 3 sec hold - Retro Step (Mirrored)  - 1 x daily - 7 x weekly - 2 sets - 10 reps - 3 sec hold - Prone Quadriceps Stretch with Strap  - 2 x daily - 7 x weekly - 2 sets - 3 reps - 30 sec hold - Gastroc Stretch on Wall  - 1 x daily - 7 x weekly - 2 sets - 3 reps - 30 sec hold  *Pt using MedBridge GO app  ASSESSMENT:  CLINICAL IMPRESSION: Progressed patient through strengthening, ROM, flexibility, exercises along with proprioceptive exercises and manual techniques to improve recovery post op R TKA. She arrived with more tightness and soreness in the lateral quads but after MT this subsided. She demonstrates improved R knee AROM measurements (6-87 deg) after interventions. Quad stretch was also added to HEP to  increase flexion and quad flexibility. Isatu will benefit from continued skilled PT to address ongoing ROM, strength and balance deficits to improve mobility and activity tolerance with decreased pain interference.   OBJECTIVE IMPAIRMENTS: Abnormal gait, decreased activity tolerance, decreased balance, decreased endurance, decreased mobility, difficulty walking, decreased ROM, decreased strength, increased edema, increased fascial restrictions, impaired sensation, and pain.   ACTIVITY LIMITATIONS: carrying, lifting, bending, sitting, standing, sleeping, stairs, transfers, and locomotion level  PARTICIPATION LIMITATIONS: meal prep, cleaning, laundry, driving, shopping, and community activity  PERSONAL FACTORS: Time since onset of injury/illness/exacerbation and 1-2 comorbidities: Chronic B knee OA, s/p R TKA 09/15/22 & L TKA 04/21/22; anxiety; h/o DVT and B PE 2017; IVC filter insertion 04/12/22  are also affecting patient's functional outcome.   REHAB POTENTIAL: Excellent  CLINICAL DECISION MAKING: Stable/uncomplicated  EVALUATION COMPLEXITY: Low   GOALS:  Goals reviewed with patient? Yes  SHORT TERM GOALS: Target date: 10/26/2022  Patient will be independent with initial HEP. Baseline:  Goal status: IN PROGRESS  2.  Patient will demonstrate full (0) deg R knee extension  Baseline: lacking 5 deg passively  Goal status: IN PROGRESS  3.  Patient will demonstrate 100 deg R knee flexion  Baseline: 90 Goal status: IN PROGRESS   LONG TERM GOALS: Target date: 11/16/2022  Patient will be independent with advanced/ongoing HEP to improve outcomes and carryover.  Baseline:  Goal status: IN PROGRESS  2.  Patient will report at least 75% improvement in R knee pain to improve QOL. Baseline: 4/10 at worst Goal status: IN PROGRESS  3.  Patient will demonstrate improved R knee AROM to >/= 0-115 deg to allow for normal gait and stair mechanics. Baseline: -10-80 Goal status: IN  PROGRESS  4.  Patient will demonstrate improved R quad strength to >/= 4+/5 for improved stability and ease of mobility. Baseline: 2+/5 Goal status: IN PROGRESS  5.  Patient will be able to ambulate 600' with LRAD and normal gait pattern without increased pain to access community.  Baseline: using 2WRW Goal status: IN PROGRESS  6. Patient will be able to ascend/descend stairs with 1 HR and reciprocal step pattern safely to access home and community.  Baseline: NT Goal status: IN PROGRESS  7.  Patient will report >40/80 on LEFS to demonstrate improved functional ability. Baseline: 25/80 Goal status: IN PROGRESS  8.  Patient will demonstrate at least 19/24 on DGI to decrease risk of falls. Baseline: NT Goal status: IN PROGRESS   PLAN:  PT FREQUENCY: 2x/week  PT DURATION: 6 weeks  PLANNED INTERVENTIONS: Therapeutic exercises, Therapeutic activity, Neuromuscular re-education, Balance training, Gait training, Patient/Family education, Self Care, Joint mobilization, Stair training, Orthotic/Fit training, Electrical stimulation, Cryotherapy, Vasopneumatic device, Manual therapy, and Re-evaluation  PLAN FOR NEXT SESSION: review and progress HEP, measure ROM weekly, modalities PRN.   Dr. Kathline Magic Knee Replacement Protocol  [ X ] Evaluate and Treat -ONLY AS INDICATED [ X ] Strengthening [ X ] Stretching [ X ] Soft Tissue Mobilization [ X ] Modalities PRN Other:   Functional Guidelines: Weight Bearing Status: WBAT  Gait: Progression from use of an assistive device to non-assisted ambulation is encouraged immediately post-operatively. Progression is per physical therapist discretion, with the primary goal of avoiding gait deviations.  Emphasis should be placed on extension exercises  Patient should be educated to incorporate extension of the surgical knee in sleeping and sitting positions. (i.e. no pillows under knee when sleeping and leg elevated when sitting)  Increasing range  of motion of the surgical knee should be the goal of physical therapy. The patient should achieve 0 degrees of knee extension and 100 degrees of knee flexion by three (3) weeks post-operative.  Golfing is allowed per patient tolerance  Biking is allowed per patient tolerance  Please call [phone] weekly regarding ROM if less than 90 degrees  Please send progress note with patient to all follow-up visits   *PT to start 09/20/22 (PT start delayed due to intraoperative R patella fracture)   Darleene Cleaver, PTA 10/13/2022, 5:57 PM   [facility] [address]  [address] [place], [place], [address] Phone: [phone]   Fax:  [phone]

## 2022-10-18 ENCOUNTER — Encounter: Payer: Federal, State, Local not specified - PPO | Admitting: Physical Therapy

## 2022-10-18 ENCOUNTER — Ambulatory Visit: Payer: Federal, State, Local not specified - PPO | Admitting: Physical Therapy

## 2022-10-18 DIAGNOSIS — M25661 Stiffness of right knee, not elsewhere classified: Secondary | ICD-10-CM

## 2022-10-18 DIAGNOSIS — R262 Difficulty in walking, not elsewhere classified: Secondary | ICD-10-CM

## 2022-10-18 DIAGNOSIS — M25561 Pain in right knee: Secondary | ICD-10-CM

## 2022-10-18 DIAGNOSIS — R600 Localized edema: Secondary | ICD-10-CM

## 2022-10-18 DIAGNOSIS — M6281 Muscle weakness (generalized): Secondary | ICD-10-CM

## 2022-10-18 NOTE — Therapy (Signed)
OUTPATIENT PHYSICAL THERAPY TREATMENT   Patient Name: Lori Klein MRN: [id number] DOB:[birth year], 57 y.o., female Today's Date: 10/18/2022   END OF SESSION:  PT End of Session - 10/18/22 0932     Visit Number 5    Date for PT Re-Evaluation 11/16/22    Authorization Type Federal [employer] - VL: 50 (13 used in prior PT episode)    PT Start Time 0932    PT Stop Time 1026    PT Time Calculation (min) 54 min    Activity Tolerance Patient tolerated treatment well    Behavior During Therapy WFL for tasks assessed/performed              Past Medical History:  Diagnosis Date   Anxiety    Arthritis    Blood dyscrasia    has sickle cell trait   DVT (deep venous thrombosis) (HCC)    Headache    migraine   PONV (postoperative nausea and vomiting)    Pre-diabetes    Pulmonary embolism (HCC)    Past Surgical History:  Procedure Laterality Date   CHOLECYSTECTOMY  2008   laparoscopic   IVC FILTER INSERTION N/A 04/12/2022   Procedure: IVC FILTER INSERTION;  Surgeon: Maeola Harman, MD;  Location: [facility] INVASIVE CV LAB;  Service: Cardiovascular;  Laterality: N/A;   IVC VENOGRAPHY N/A 04/12/2022   Procedure: IVC Venography;  Surgeon: Maeola Harman, MD;  Location: [facility] INVASIVE CV LAB;  Service: Cardiovascular;  Laterality: N/A;   KNEE ARTHROPLASTY Left 04/21/2022   Procedure: COMPUTER ASSISTED TOTAL KNEE ARTHROPLASTY;  Surgeon: Samson Frederic, MD;  Location: [facility];  Service: Orthopedics;  Laterality: Left;   KNEE ARTHROPLASTY Right 09/15/2022   Procedure: COMPUTER ASSISTED RIGHT TOTAL KNEE ARTHROPLASTY;  Surgeon: Samson Frederic, MD;  Location: [facility];  Service: Orthopedics;  Laterality: Right;   ulnar surgery Left 2017   released compressed nerve   WRIST SURGERY Right    nerve de fragmentation   Patient Active Problem List   Diagnosis Date Noted   Osteoarthritis of right knee 09/15/2022   Osteoarthritis of left knee 04/21/2022   Bilateral pulmonary  embolism (HCC) 09/27/2015   Acute deep vein thrombosis (DVT) of right lower extremity (HCC) 09/27/2015    PCP: Tiana Loft, PA-C   REFERRING PROVIDER: Clois Dupes, PA-C   REFERRING DIAG: [phone] (ICD-10-CM) - S/P TKR (total knee replacement)   THERAPY DIAG:  Stiffness of right knee, not elsewhere classified  Acute pain of right knee  Localized edema  Muscle weakness (generalized)  Difficulty in walking, not elsewhere classified  RATIONALE FOR EVALUATION AND TREATMENT: Rehabilitation  ONSET DATE: 09/15/2022 - R TKA with cemented patella due to intraoperative vertical patellar fracture   NEXT MD VISIT: 10/25/2022   SUBJECTIVE:  SUBJECTIVE STATEMENT: Pt denies pain today - "it's a good feeling not to have any pain".  EVAL: Pt had R TKA on 09/15/22.  She had patellar fracture at the end of surgery and was in knee immobilizer for 3 weeks.  She has been ok'd to start PT, but to wear immobilizer and use walker until she has better quad control.  She has already started her exercises, the ones she could remember from her previous TKA done last March and has been working on quad sets to regain her quad control, and has stopped using the knee immobilizer.  Otherwise doing well, not a lot of pain, but she has had a lot of insomnia with this knee, only sleeping 2-3 hours.  Not from pain, just really uncomfortable.    PAIN: Are you having pain? No  PERTINENT HISTORY:  Chronic B knee OA, s/p R TKA 09/15/22 & L TKA 04/21/22; anxiety; h/o DVT and B PE 2017; IVC filter insertion 04/12/22  PRECAUTIONS: Other: history DVT  RED FLAGS: None  WEIGHT BEARING RESTRICTIONS: No  FALLS:  Has patient fallen in last 6 months? 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  PATIENT SURVEYS:  LEFS 25/80  COGNITION: Overall cognitive status: Within functional limits for tasks assessed    SENSATION: Decreased sensation on medial and lateral sides of incision.   EDEMA:   Right cm Left cm  Patella (midline) 40 38.5  4 in above patella  50 48  4 in below patella 41 40  ankles 21 21    MUSCLE LENGTH: NT  POSTURE:  No Significant postural limitations  PALPATION: No tenderness in calf.  Incision healing well, no tenderness or redness.    LOWER EXTREMITY ROM: L knee post-rehab following L TKA 0-120 (05/24/22)  Active ROM Right eval Left   eval Right 10/13/22   Knee flexion 80 115 87 seated  Knee extension -10 0 -6 supine  Ankle dorsiflexion      Passive ROM Right eval Left   eval  Knee flexion 90 115  Knee extension -5 0  Ankle dorsiflexion    (Blank rows = not tested)  LOWER EXTREMITY MMT:  MMT Right eval Left   eval  Hip flexion 5 5  Hip extension 5 5  Hip abduction 5 5  Hip adduction 5 5  Knee flexion 3 5  Knee extension 2+ (8 deg quad lag 5  Ankle dorsiflexion 5 5  Ankle plantarflexion     (Blank rows = not tested)  Edema   FUNCTIONAL TESTS:  5 times sit to stand: 16.75 seconds with bil UE assist and R leg forward.     GAIT: Distance walked: 56' Assistive device utilized: Environmental consultant - 2 wheeled Level of assistance: Modified independence Gait pattern: step through pattern Comments:  decreased gait speed 0.5 m/s   TODAY'S TREATMENT:   10/18/2022  THERAPEUTIC EXERCISE: to improve flexibility, strength and mobility.  Demonstration, verbal and tactile cues throughout for technique. Rec  bike (seat #8) - rocking/partial revolutions x 6 min Standing R TKE into ball on wall 10 x 5", 2 sets R retro step + contralateral arm raise 2 x 10 R step stretch on 9" stool for knee flexion ROM 2 x 10, 2nd set with slight bend in L knee to lower hips for increased R knee flexion R fwd step-up to 4" step x 10, 6" step x 10; intermittent light UE support on counter R lateral step-up to 6" step x 10; light UE support on counter Seated GTB R HS curls 2 x 10 Seated R Fitter (1 black) leg press 2 x 10 Supine B HS curls with heels on peanut ball 2 x 10 - strap assist for R knee flexion AAROM Supine quad + glute set with straight leg bridge pressing heels into peanut ball 2 x 10  MANUAL THERAPY: To promote normalized muscle tension, improved flexibility, improved joint mobility, increased ROM, and reduced pain.  R patellar mobs - all directions R knee grade II-III femorotibial mobs for R knee extension STM/DTM & XFM to R distal lateral HS  MODALITIES: Game Ready vasopneumatic compression post session to R knee x 10 min, medium compression, 34 to reduce post-exercise pain and swelling/edema   10/13/22 THERAPEUTIC EXERCISE: to improve flexibility, strength and mobility.  Demonstration, verbal and tactile cues throughout for technique. Nustep L5x4 min UE/LE Recumbent Bike partial rev x 3 min Prone R quad stretch with strap 2x30 sec Seated R knee flexion AAROM with strap 2x12  Standing R knee flexion on 6',8' step x 10 each  Mini squats with UE support 2x10 Runner stretch 2x30 sec R  MANUAL THERAPY: To promote improved flexibility, improved joint mobility, and increased ROM.  IASTM to R VL, ITB with rolling stick  NEUROMUSCULAR RE-EDUCATION: To improve posture, balance, and  kinesthesia. Clock balance toe tapping with LLE 1/2way around 4x Retro steps with arm raise 2x10 Toe raise intermittent support x 20   MODALITIES: Game Ready vasopneumatic compression post session to R knee x 10 min, medium compression, 34 to reduce post-exercise pain and swelling/edema   10/11/22 THERAPEUTIC EXERCISE: to improve flexibility, strength and mobility.  Demonstration, verbal and tactile cues throughout for technique. Nustep L5x53min TKE standing back to wall 2x10  Toe raises back to wall 2x10 Seated R knee flexion AAROM with strap 2x10 Seated LAQ R 2x10 Supine R QS with pillow under knee 2x10 Seated hamstring stretch with strap 2x30 sec Runner stretch 2x30 sec  MANUAL THERAPY: To promote improved flexibility, improved joint mobility, and increased ROM.  R knee patellar mobs  R knee incisional scar mobilization    MODALITIES: Game Ready vasopneumatic compression post session to R knee x 10 min, medium compression, 34 to reduce post-exercise pain and swelling/edema   PATIENT EDUCATION:  Education details: HEP update - runner stretch and quad stretch added   Person educated: Patient Education method: Explanation, Demonstration, Verbal cues, and MedBridgeGO app updated Education comprehension: verbalized understanding and returned demonstration  HOME EXERCISE PROGRAM: *Access Code: [email or web address] URL: [email or web address] Date: 10/13/2022 Prepared by: Verta Ellen  Exercises - Supine Quad Set  - 2 x daily - 7 x weekly - 2 sets - 10 reps - Active Straight Leg Raise with Quad Set  - 2 x daily - 7 x weekly - 2 sets - 10 reps - Supine Heel Slide with Strap  - 2 x daily - 7 x weekly - 2 sets - 10 reps - Supine Hip Abduction  - 2 x  daily - 7 x weekly - 2 sets - 10 reps - Supine Knee Extension Strengthening  - 2 x daily - 7 x weekly - 2 sets - 10 reps - Seated Long Arc Quad  - 2 x daily - 7 x weekly - 2 sets - 10 reps - Seated Long Arc Quad with Strap  - 2 x  daily - 7 x weekly - 1 sets - 5-10 reps - 10 sec hold - Seated Quad Set  - 2 x daily - 7 x weekly - 2 sets - 10 reps - Seated Knee Flexion AAROM  - 2 x daily - 7 x weekly - 2 sets - 10 reps - Seated Hamstring Stretch with Strap (Mirrored)  - 2 x daily - 7 x weekly - 3 reps - 30 sec hold - [place]  - 1 x daily - 7 x weekly - 2 sets - 10 reps - 3 sec hold - Retro Step (Mirrored)  - 1 x daily - 7 x weekly - 2 sets - 10 reps - 3 sec hold - Prone Quadriceps Stretch with Strap  - 2 x daily - 7 x weekly - 2 sets - 3 reps - 30 sec hold - Gastroc Stretch on Wall  - 1 x daily - 7 x weekly - 2 sets - 3 reps - 30 sec hold  *Pt using MedBridge GO app   ASSESSMENT:  CLINICAL IMPRESSION: Shaquanda reports her pain remains well controlled but continues to note stiffness in her R knee. She remains unable to achieve full revolutions on recumbent bike but  appears close. Exercises continuing to target quad and HS strengthening for improved knee stability and improved R knee ROM. Some tenderness noted in R lateral distal HS during MT, but otherwise no pain reported. Pt was able to ambulate w/o cane within PT clinic w/o signs of R knee instability. Verlaine will benefit from continued skilled PT to address ongoing ROM, strength and balance deficits to improve mobility and activity tolerance with decreased pain interference.   OBJECTIVE IMPAIRMENTS: Abnormal gait, decreased activity tolerance, decreased balance, decreased endurance, decreased mobility, difficulty walking, decreased ROM, decreased strength, increased edema, increased fascial restrictions, impaired sensation, and pain.   ACTIVITY LIMITATIONS: carrying, lifting, bending, sitting, standing, sleeping, stairs, transfers, and locomotion level  PARTICIPATION LIMITATIONS: meal prep, cleaning, laundry, driving, shopping, and community activity  PERSONAL FACTORS: Time since onset of injury/illness/exacerbation and 1-2 comorbidities: Chronic B knee OA, s/p R  TKA 09/15/22 & L TKA 04/21/22; anxiety; h/o DVT and B PE 2017; IVC filter insertion 04/12/22  are also affecting patient's functional outcome.   REHAB POTENTIAL: Excellent  CLINICAL DECISION MAKING: Stable/uncomplicated  EVALUATION COMPLEXITY: Low   GOALS: Goals reviewed with patient? Yes  SHORT TERM GOALS: Target date: 10/26/2022  Patient will be independent with initial HEP. Baseline:  Goal status: MET  10/18/22  2.  Patient will demonstrate full (0) deg R knee extension  Baseline: lacking 5 deg passively  Goal status: IN PROGRESS  3.  Patient will demonstrate 100 deg R knee flexion  Baseline: 90 Goal status: IN PROGRESS   LONG TERM GOALS: Target date: 11/16/2022  Patient will be independent with advanced/ongoing HEP to improve outcomes and carryover.  Baseline:  Goal status: IN PROGRESS  2.  Patient will report at least 75% improvement in R knee pain to improve QOL. Baseline: 4/10 at worst Goal status: IN PROGRESS  3.  Patient will demonstrate improved R knee AROM to >/= 0-115 deg  to allow for normal gait and stair mechanics. Baseline: -10-80 Goal status: IN PROGRESS  4.  Patient will demonstrate improved R quad strength to >/= 4+/5 for improved stability and ease of mobility. Baseline: 2+/5 Goal status: IN PROGRESS  5.  Patient will be able to ambulate 600' with LRAD and normal gait pattern without increased pain to access community.  Baseline: using 2WRW Goal status: IN PROGRESS  6. Patient will be able to ascend/descend stairs with 1 HR and reciprocal step pattern safely to access home and community.  Baseline: NT Goal status: IN PROGRESS  7.  Patient will report >40/80 on LEFS to demonstrate improved functional ability. Baseline: 25/80 Goal status: IN PROGRESS  8.  Patient will demonstrate at least 19/24 on DGI to decrease risk of falls. Baseline: NT Goal status: IN PROGRESS   PLAN:  PT FREQUENCY: 2x/week  PT DURATION: 6 weeks  PLANNED  INTERVENTIONS: Therapeutic exercises, Therapeutic activity, Neuromuscular re-education, Balance training, Gait training, Patient/Family education, Self Care, Joint mobilization, Stair training, Orthotic/Fit training, Electrical stimulation, Cryotherapy, Vasopneumatic device, Manual therapy, and Re-evaluation  PLAN FOR NEXT SESSION: review and progress HEP, measure ROM weekly, modalities PRN.   Dr. Kathline Magic Knee Replacement Protocol  [ X ] Evaluate and Treat -ONLY AS INDICATED [ X ] Strengthening [ X ] Stretching [ X ] Soft Tissue Mobilization [ X ] Modalities PRN Other:   Functional Guidelines: Weight Bearing Status: WBAT  Gait: Progression from use of an assistive device to non-assisted ambulation is encouraged immediately post-operatively. Progression is per physical therapist discretion, with the primary goal of avoiding gait deviations.  Emphasis should be placed on extension exercises  Patient should be educated to incorporate extension of the surgical knee in sleeping and sitting positions. (i.e. no pillows under knee when sleeping and leg elevated when sitting)  Increasing range of motion of the surgical knee should be the goal of physical therapy. The patient should achieve 0 degrees of knee extension and 100 degrees of knee flexion by three (3) weeks post-operative.  Golfing is allowed per patient tolerance  Biking is allowed per patient tolerance  Please call [phone] weekly regarding ROM if less than 90 degrees  Please send progress note with patient to all follow-up visits   *PT to start 09/20/22 (PT start delayed due to intraoperative R patella fracture)   Marry Guan, PT 10/18/2022, 12:59 PM   [facility] [address]  [address] [place], [place], [address] Phone: [phone]   Fax:  [phone]

## 2022-10-20 ENCOUNTER — Ambulatory Visit: Payer: Federal, State, Local not specified - PPO

## 2022-10-20 DIAGNOSIS — M25561 Pain in right knee: Secondary | ICD-10-CM

## 2022-10-20 DIAGNOSIS — M6281 Muscle weakness (generalized): Secondary | ICD-10-CM

## 2022-10-20 DIAGNOSIS — R262 Difficulty in walking, not elsewhere classified: Secondary | ICD-10-CM

## 2022-10-20 DIAGNOSIS — M25661 Stiffness of right knee, not elsewhere classified: Secondary | ICD-10-CM

## 2022-10-20 DIAGNOSIS — R600 Localized edema: Secondary | ICD-10-CM

## 2022-10-20 NOTE — Therapy (Signed)
OUTPATIENT PHYSICAL THERAPY TREATMENT   Patient Name: Lori Klein MRN: [id number] DOB:Jan 20, 1966, 57 y.o., female Today's Date: 10/20/2022   END OF SESSION:  PT End of Session - 10/20/22 1618     Visit Number 6    Date for PT Re-Evaluation 11/16/22    Authorization Type Federal [employer] - VL: 50 (13 used in prior PT episode)    PT Start Time 1614    PT Stop Time 1712    PT Time Calculation (min) 58 min    Activity Tolerance Patient tolerated treatment well    Behavior During Therapy WFL for tasks assessed/performed               Past Medical History:  Diagnosis Date   Anxiety    Arthritis    Blood dyscrasia    has sickle cell trait   DVT (deep venous thrombosis) (HCC)    Headache    migraine   PONV (postoperative nausea and vomiting)    Pre-diabetes    Pulmonary embolism (HCC)    Past Surgical History:  Procedure Laterality Date   CHOLECYSTECTOMY  2008   laparoscopic   IVC FILTER INSERTION N/A 04/12/2022   Procedure: IVC FILTER INSERTION;  Surgeon: Maeola Harman, MD;  Location: [facility] INVASIVE CV LAB;  Service: Cardiovascular;  Laterality: N/A;   IVC VENOGRAPHY N/A 04/12/2022   Procedure: IVC Venography;  Surgeon: Maeola Harman, MD;  Location: [facility] INVASIVE CV LAB;  Service: Cardiovascular;  Laterality: N/A;   KNEE ARTHROPLASTY Left 04/21/2022   Procedure: COMPUTER ASSISTED TOTAL KNEE ARTHROPLASTY;  Surgeon: Samson Frederic, MD;  Location: [facility] ORS;  Service: Orthopedics;  Laterality: Left;   KNEE ARTHROPLASTY Right 09/15/2022   Procedure: COMPUTER ASSISTED RIGHT TOTAL KNEE ARTHROPLASTY;  Surgeon: Samson Frederic, MD;  Location: [facility];  Service: Orthopedics;  Laterality: Right;   ulnar surgery Left 2017   released compressed nerve   WRIST SURGERY Right    nerve de fragmentation   Patient Active Problem List   Diagnosis Date Noted   Osteoarthritis of right knee 09/15/2022   Osteoarthritis of left knee 04/21/2022   Bilateral pulmonary  embolism (HCC) 09/27/2015   Acute deep vein thrombosis (DVT) of right lower extremity (HCC) 09/27/2015    PCP: Tiana Loft, PA-C   REFERRING PROVIDER: Clois Dupes, PA-C   REFERRING DIAG: [id number] (ICD-10-CM) - S/P TKR (total knee replacement)   THERAPY DIAG:  Stiffness of right knee, not elsewhere classified  Acute pain of right knee  Localized edema  Muscle weakness (generalized)  Difficulty in walking, not elsewhere classified  RATIONALE FOR EVALUATION AND TREATMENT: Rehabilitation  ONSET DATE: 09/15/2022 - R TKA with cemented patella due to intraoperative vertical patellar fracture   NEXT MD VISIT: 10/25/2022   SUBJECTIVE:  SUBJECTIVE STATEMENT: Pt denies pain, no new complaints.  EVAL: Pt had R TKA on 09/15/22.  She had patellar fracture at the end of surgery and was in knee immobilizer for 3 weeks.  She has been ok'd to start PT, but to wear immobilizer and use walker until she has better quad control.  She has already started her exercises, the ones she could remember from her previous TKA done last March and has been working on quad sets to regain her quad control, and has stopped using the knee immobilizer.  Otherwise doing well, not a lot of pain, but she has had a lot of insomnia with this knee, only sleeping 2-3 hours.  Not from pain, just really uncomfortable.    PAIN: Are you having pain? No  PERTINENT HISTORY:  Chronic B knee OA, s/p R TKA 09/15/22 & L TKA 04/21/22; anxiety; h/o DVT and B PE 2017; IVC filter insertion 04/12/22  PRECAUTIONS: Other: history DVT  RED FLAGS: None  WEIGHT BEARING RESTRICTIONS: No  FALLS:  Has patient fallen in last 6 months? 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  PATIENT SURVEYS:  LEFS 25/80  COGNITION: Overall cognitive status: Within functional limits for tasks assessed    SENSATION: Decreased sensation on medial and lateral sides of incision.   EDEMA:   Right cm Left cm  Patella (midline) 40 38.5  4 in above patella  50 48  4 in below patella 41 40  ankles 21 21    MUSCLE LENGTH: NT  POSTURE:  No Significant postural limitations  PALPATION: No tenderness in calf.  Incision healing well, no tenderness or redness.    LOWER EXTREMITY ROM: L knee post-rehab following L TKA 0-120 (05/24/22)  Active ROM Right eval Left   eval Right 10/13/22  R 10/20/22  Knee flexion 80 115 87 seated 96  Knee extension -10 0 -6 supine 5  Ankle dorsiflexion       Passive ROM Right eval Left   eval  Knee flexion 90 115  Knee extension -5 0  Ankle dorsiflexion    (Blank rows = not tested)  LOWER EXTREMITY MMT:  MMT Right eval Left   eval  Hip flexion 5 5  Hip extension 5 5  Hip abduction 5 5  Hip adduction 5 5  Knee flexion 3 5  Knee extension 2+ (8 deg quad lag 5  Ankle dorsiflexion 5 5  Ankle plantarflexion     (Blank rows = not tested)  Edema   FUNCTIONAL TESTS:  5 times sit to stand: 16.75 seconds with bil UE assist and R leg forward.     GAIT: Distance walked: 65' Assistive device utilized: Environmental consultant - 2 wheeled Level of assistance: Modified independence Gait pattern: step through pattern Comments: decreased gait  speed 0.5 m/s   TODAY'S TREATMENT:  10/20/2022  THERAPEUTIC EXERCISE: to improve flexibility, strength and mobility.  Demonstration, verbal and tactile cues throughout for technique. Rec bike (seat #8) -  rocking/partial revolutions x 6 min RSLS 2x trials 20 seconds each time Clocks with LLE standing on RLE 3x all the way around R SLS pallof press RTB x 20 bil R SLS OTIS with RTB x 10  Squats with UE support x 10  Wall squats 2x10 3 sec hold R runner stretch x 30 sec R gastroc heel drop stretch 2x30 sec Knee ROM measured  MANUAL THERAPY: To promote normalized muscle tension, improved flexibility, improved joint mobility, increased ROM, and reduced pain.  R patellar mobs - all directions R knee grade II-III femorotibial mobs for R knee extension   MODALITIES: Game Ready vasopneumatic compression post session to R knee x 10 min, medium compression, 34 to reduce post-exercise pain and swelling/edema  10/18/2022  THERAPEUTIC EXERCISE: to improve flexibility, strength and mobility.  Demonstration, verbal and tactile cues throughout for technique. Rec bike (seat #8) - rocking/partial revolutions x 6 min Standing R TKE into ball on wall 10 x 5", 2 sets R retro step + contralateral arm raise 2 x 10 R step stretch on 9" stool for knee flexion ROM 2 x 10, 2nd set with slight bend in L knee to lower hips for increased R knee flexion R fwd step-up to 4" step x 10, 6" step x 10; intermittent light UE support on counter R lateral step-up to 6" step x 10; light UE support on counter Seated GTB R HS curls 2 x 10 Seated R Fitter (1 black) leg press 2 x 10 Supine B HS curls with heels on peanut ball 2 x 10 - strap assist for R knee flexion AAROM Supine quad + glute set with straight leg bridge pressing heels into peanut ball 2 x 10  MANUAL THERAPY: To promote normalized muscle tension, improved flexibility, improved joint mobility, increased ROM, and reduced pain.  R patellar mobs - all  directions R knee grade II-III femorotibial mobs for R knee extension STM/DTM & XFM to R distal lateral HS  MODALITIES: Game Ready vasopneumatic compression post session to R knee x 10 min, medium compression, 34 to reduce post-exercise pain and swelling/edema   10/13/22 THERAPEUTIC EXERCISE: to improve flexibility, strength and mobility.  Demonstration, verbal and tactile cues throughout for technique. Nustep L5x4 min UE/LE Recumbent Bike partial rev x 3 min Prone R quad stretch with strap 2x30 sec Seated R knee flexion AAROM with strap 2x12  Standing R knee flexion on 6',8' step x 10 each  Mini squats with UE support 2x10 Runner stretch 2x30 sec R  MANUAL THERAPY: To promote improved flexibility, improved joint mobility, and increased ROM.  IASTM to R VL, ITB with rolling stick  NEUROMUSCULAR RE-EDUCATION: To improve posture, balance, and kinesthesia. Clock balance toe tapping with LLE 1/2way around 4x Retro steps with arm raise 2x10 Toe raise intermittent support x 20   MODALITIES: Game Ready vasopneumatic compression post session to R knee x 10 min, medium compression, 34 to reduce post-exercise pain and swelling/edema   10/11/22 THERAPEUTIC EXERCISE: to improve flexibility, strength and mobility.  Demonstration, verbal and tactile cues throughout for technique. Nustep L5x40min TKE standing back to wall 2x10  Toe raises back to wall 2x10 Seated R knee flexion AAROM with strap 2x10 Seated LAQ R 2x10 Supine R QS with pillow under knee 2x10 Seated hamstring stretch with strap 2x30 sec Runner stretch 2x30 sec  MANUAL THERAPY: To promote improved flexibility, improved joint mobility, and increased ROM.  R knee patellar mobs  R knee incisional scar mobilization  MODALITIES: Game Ready vasopneumatic compression post session to R knee x 10 min, medium compression, 34 to reduce post-exercise pain and swelling/edema   PATIENT EDUCATION:  Education details: HEP update -  runner stretch and quad stretch added   Person educated: Patient Education method: Explanation, Demonstration, Verbal cues, and MedBridgeGO app updated Education comprehension: verbalized understanding and returned demonstration  HOME EXERCISE PROGRAM: *Access Code: [email or web address] URL: [email or web address] Date: 10/13/2022 Prepared by: Verta Ellen  Exercises - Supine Quad Set  - 2 x daily - 7 x weekly - 2 sets - 10 reps - Active Straight Leg Raise with Quad Set  - 2 x daily - 7 x weekly - 2 sets - 10 reps - Supine Heel Slide with Strap  - 2 x daily - 7 x weekly - 2 sets - 10 reps - Supine Hip Abduction  - 2 x daily - 7 x weekly - 2 sets - 10 reps - Supine Knee Extension Strengthening  - 2 x daily - 7 x weekly - 2 sets - 10 reps - Seated Long Arc Quad  - 2 x daily - 7 x weekly - 2 sets - 10 reps - Seated Long Arc Quad with Strap  - 2 x daily - 7 x weekly - 1 sets - 5-10 reps - 10 sec hold - Seated Quad Set  - 2 x daily - 7 x weekly - 2 sets - 10 reps - Seated Knee Flexion AAROM  - 2 x daily - 7 x weekly - 2 sets - 10 reps - Seated Hamstring Stretch with Strap (Mirrored)  - 2 x daily - 7 x weekly - 3 reps - 30 sec hold - [place]  - 1 x daily - 7 x weekly - 2 sets - 10 reps - 3 sec hold - Retro Step (Mirrored)  - 1 x daily - 7 x weekly - 2 sets - 10 reps - 3 sec hold - Prone Quadriceps Stretch with Strap  - 2 x daily - 7 x weekly - 2 sets - 3 reps - 30 sec hold - Gastroc Stretch on Wall  - 1 x daily - 7 x weekly - 2 sets - 3 reps - 30 sec hold  *Pt using MedBridge GO app   ASSESSMENT:  CLINICAL IMPRESSION: Progressed work on balance and proprioception in RLE to begin session. She shows a lot of hip compensation with step ups so added wall squats to work on weight acceptance of RLE with stairs. Improvements made in ROM today measured 5-0-96, so she is showing good progress. Lori Klein will benefit from continued skilled PT to address ongoing ROM, strength and balance deficits  to improve mobility and activity tolerance with decreased pain interference.   OBJECTIVE IMPAIRMENTS: Abnormal gait, decreased activity tolerance, decreased balance, decreased endurance, decreased mobility, difficulty walking, decreased ROM, decreased strength, increased edema, increased fascial restrictions, impaired sensation, and pain.   ACTIVITY LIMITATIONS: carrying, lifting, bending, sitting, standing, sleeping, stairs, transfers, and locomotion level  PARTICIPATION LIMITATIONS: meal prep, cleaning, laundry, driving, shopping, and community activity  PERSONAL FACTORS: Time since onset of injury/illness/exacerbation and 1-2 comorbidities: Chronic B knee OA, s/p R TKA 09/15/22 & L TKA 04/21/22; anxiety; h/o DVT and B PE 2017; IVC filter insertion 04/12/22  are also affecting patient's functional outcome.   REHAB POTENTIAL: Excellent  CLINICAL DECISION MAKING: Stable/uncomplicated  EVALUATION COMPLEXITY: Low   GOALS: Goals reviewed with patient? Yes  SHORT TERM GOALS: Target date: 10/26/2022  Patient will be independent with  initial HEP. Baseline:  Goal status: MET  10/18/22  2.  Patient will demonstrate full (0) deg R knee extension  Baseline: lacking 5 deg passively  Goal status: IN PROGRESS  3.  Patient will demonstrate 100 deg R knee flexion  Baseline: 90 Goal status: IN PROGRESS   LONG TERM GOALS: Target date: 11/16/2022  Patient will be independent with advanced/ongoing HEP to improve outcomes and carryover.  Baseline:  Goal status: IN PROGRESS  2.  Patient will report at least 75% improvement in R knee pain to improve QOL. Baseline: 4/10 at worst Goal status: IN PROGRESS  3.  Patient will demonstrate improved R knee AROM to >/= 0-115 deg to allow for normal gait and stair mechanics. Baseline: -10-80 Goal status: IN PROGRESS  4.  Patient will demonstrate improved R quad strength to >/= 4+/5 for improved stability and ease of mobility. Baseline: 2+/5 Goal status:  IN PROGRESS  5.  Patient will be able to ambulate 600' with LRAD and normal gait pattern without increased pain to access community.  Baseline: using 2WRW Goal status: IN PROGRESS  6. Patient will be able to ascend/descend stairs with 1 HR and reciprocal step pattern safely to access home and community.  Baseline: NT Goal status: IN PROGRESS  7.  Patient will report >40/80 on LEFS to demonstrate improved functional ability. Baseline: 25/80 Goal status: IN PROGRESS  8.  Patient will demonstrate at least 19/24 on DGI to decrease risk of falls. Baseline: NT Goal status: IN PROGRESS   PLAN:  PT FREQUENCY: 2x/week  PT DURATION: 6 weeks  PLANNED INTERVENTIONS: Therapeutic exercises, Therapeutic activity, Neuromuscular re-education, Balance training, Gait training, Patient/Family education, Self Care, Joint mobilization, Stair training, Orthotic/Fit training, Electrical stimulation, Cryotherapy, Vasopneumatic device, Manual therapy, and Re-evaluation  PLAN FOR NEXT SESSION: review and progress HEP, measure ROM weekly, modalities PRN.   Dr. Kathline Magic Knee Replacement Protocol  [ X ] Evaluate and Treat -ONLY AS INDICATED [ X ] Strengthening [ X ] Stretching [ X ] Soft Tissue Mobilization [ X ] Modalities PRN Other:   Functional Guidelines: Weight Bearing Status: WBAT  Gait: Progression from use of an assistive device to non-assisted ambulation is encouraged immediately post-operatively. Progression is per physical therapist discretion, with the primary goal of avoiding gait deviations.  Emphasis should be placed on extension exercises  Patient should be educated to incorporate extension of the surgical knee in sleeping and sitting positions. (i.e. no pillows under knee when sleeping and leg elevated when sitting)  Increasing range of motion of the surgical knee should be the goal of physical therapy. The patient should achieve 0 degrees of knee extension and 100 degrees of knee  flexion by three (3) weeks post-operative.  Golfing is allowed per patient tolerance  Biking is allowed per patient tolerance  Please call [phone] weekly regarding ROM if less than 90 degrees  Please send progress note with patient to all follow-up visits   *PT to start 09/20/22 (PT start delayed due to intraoperative R patella fracture)   Darleene Cleaver, PTA 10/20/2022, 5:04 PM   [facility] [address]  [address] [place], [place], [address] Phone: [phone]   Fax:  [phone]

## 2022-10-22 ENCOUNTER — Encounter: Payer: Federal, State, Local not specified - PPO | Admitting: Physical Therapy

## 2022-10-25 ENCOUNTER — Ambulatory Visit: Payer: Federal, State, Local not specified - PPO | Admitting: Physical Therapy

## 2022-10-25 ENCOUNTER — Encounter: Payer: Self-pay | Admitting: Physical Therapy

## 2022-10-25 DIAGNOSIS — M25661 Stiffness of right knee, not elsewhere classified: Secondary | ICD-10-CM

## 2022-10-25 DIAGNOSIS — R600 Localized edema: Secondary | ICD-10-CM

## 2022-10-25 DIAGNOSIS — M25561 Pain in right knee: Secondary | ICD-10-CM

## 2022-10-25 DIAGNOSIS — M6281 Muscle weakness (generalized): Secondary | ICD-10-CM

## 2022-10-25 DIAGNOSIS — R262 Difficulty in walking, not elsewhere classified: Secondary | ICD-10-CM

## 2022-10-25 NOTE — Therapy (Signed)
OUTPATIENT PHYSICAL THERAPY TREATMENT  Progress Note  Reporting Period 10/05/2022 to 10/25/2022  See note below for Objective Data and Assessment of Progress/Goals.     Patient Name: Lori Klein MRN: [id number] DOB:[birth year], 57 y.o., female Today's Date: 10/25/2022   END OF SESSION:  PT End of Session - 10/25/22 0930     Visit Number 7    Date for PT Re-Evaluation 11/16/22    Authorization Type Federal [employer] - VL: 50 (13 used in prior PT episode)    PT Start Time 0930    PT Stop Time 1026    PT Time Calculation (min) 56 min    Activity Tolerance Patient tolerated treatment well    Behavior During Therapy WFL for tasks assessed/performed               Past Medical History:  Diagnosis Date   Anxiety    Arthritis    Blood dyscrasia    has sickle cell trait   DVT (deep venous thrombosis) (HCC)    Headache    migraine   PONV (postoperative nausea and vomiting)    Pre-diabetes    Pulmonary embolism (HCC)    Past Surgical History:  Procedure Laterality Date   CHOLECYSTECTOMY  2008   laparoscopic   IVC FILTER INSERTION N/A 04/12/2022   Procedure: IVC FILTER INSERTION;  Surgeon: Maeola Harman, MD;  Location: [facility] INVASIVE CV LAB;  Service: Cardiovascular;  Laterality: N/A;   IVC VENOGRAPHY N/A 04/12/2022   Procedure: IVC Venography;  Surgeon: Maeola Harman, MD;  Location: [facility] INVASIVE CV LAB;  Service: Cardiovascular;  Laterality: N/A;   KNEE ARTHROPLASTY Left 04/21/2022   Procedure: COMPUTER ASSISTED TOTAL KNEE ARTHROPLASTY;  Surgeon: Samson Frederic, MD;  Location: [facility];  Service: Orthopedics;  Laterality: Left;   KNEE ARTHROPLASTY Right 09/15/2022   Procedure: COMPUTER ASSISTED RIGHT TOTAL KNEE ARTHROPLASTY;  Surgeon: Samson Frederic, MD;  Location: [facility];  Service: Orthopedics;  Laterality: Right;   ulnar surgery Left 2017   released compressed nerve   WRIST SURGERY Right    nerve de fragmentation   Patient Active Problem List    Diagnosis Date Noted   Osteoarthritis of right knee 09/15/2022   Osteoarthritis of left knee 04/21/2022   Bilateral pulmonary embolism (HCC) 09/27/2015   Acute deep vein thrombosis (DVT) of right lower extremity (HCC) 09/27/2015    PCP: Tiana Loft, PA-C   REFERRING PROVIDER: Clois Dupes, PA-C   REFERRING DIAG: [phone] (ICD-10-CM) - S/P TKR (total knee replacement)   THERAPY DIAG:  Stiffness of right knee, not elsewhere classified  Acute pain of right knee  Localized edema  Muscle weakness (generalized)  Difficulty in walking, not elsewhere classified  RATIONALE FOR EVALUATION AND TREATMENT: Rehabilitation  ONSET DATE: 09/15/2022 - R TKA with cemented patella due to intraoperative vertical patellar fracture   NEXT MD VISIT: 10/25/2022   SUBJECTIVE:  SUBJECTIVE STATEMENT: Pt ambulating w/o AD and feeling good - able to get out and walk in the neighborhood yesterday.  EVAL: Pt had R TKA on 09/15/22.  She had patellar fracture at the end of surgery and was in knee immobilizer for 3 weeks.  She has been ok'd to start PT, but to wear immobilizer and use walker until she has better quad control.  She has already started her exercises, the ones she could remember from her previous TKA done last March and has been working on quad sets to regain her quad control, and has stopped using the knee immobilizer.  Otherwise doing well, not a lot of pain, but she has had a lot of insomnia with this knee, only sleeping 2-3 hours.  Not from pain, just really uncomfortable.    PAIN: Are you having pain? No  PERTINENT HISTORY:  Chronic B knee OA, s/p R TKA 09/15/22 & L TKA 04/21/22; anxiety; h/o DVT and B PE 2017; IVC filter insertion 04/12/22  PRECAUTIONS: Other: history DVT  RED  FLAGS: None  WEIGHT BEARING RESTRICTIONS: No  FALLS:  Has patient fallen in last 6 months? 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  PATIENT SURVEYS:  LEFS 25/80  COGNITION: Overall cognitive status: Within functional limits for tasks assessed    SENSATION: Decreased sensation on medial and lateral sides of incision.   EDEMA:   Right cm Left cm  Patella (midline) 40 38.5  4 in above patella  50 48  4 in below patella 41 40  ankles 21 21    MUSCLE LENGTH: NT  POSTURE:  No Significant postural limitations  PALPATION: No tenderness in calf.  Incision healing well, no tenderness or redness.    LOWER EXTREMITY ROM: L knee post-rehab following L TKA 0-120 (05/24/22)  Active ROM Right eval Left   eval Right 10/13/22  R 10/20/22 R 10/25/22  Knee flexion 80 115 87 seated 96 104  Knee extension -10 0 -6 supine -5 -3 supported -8 LAQ  Ankle dorsiflexion        Passive ROM Right eval Left   eval  Knee flexion 90 115  Knee extension -5 0  Ankle dorsiflexion    (Blank rows = not tested)  LOWER EXTREMITY MMT:  MMT Right eval Left   eval  Hip flexion 5 5  Hip extension 5 5  Hip abduction 5 5  Hip adduction 5 5  Knee flexion 3 5  Knee extension 2+ (8 deg quad lag 5  Ankle dorsiflexion 5 5  Ankle plantarflexion     (Blank rows = not tested)  Edema   FUNCTIONAL TESTS:  5 times sit to stand: 16.75  seconds with bil UE assist and R leg forward.     GAIT: Distance walked: 31' Assistive device utilized: Environmental consultant - 2 wheeled Level of assistance: Modified independence Gait pattern: step through pattern Comments: decreased gait speed 0.5 m/s   TODAY'S TREATMENT:   10/25/2022  THERAPEUTIC  EXERCISE: to improve flexibility, strength and mobility.  Demonstration, verbal and tactile cues throughout for technique. Rec bike (seat #9) - full revolutions x 3', L2 x 3' R blue TB TKE 10 x 5", 2 sets - cues for quad activation w/o moving hips R fwd 6"  step-up + blue TB TKE 2 x 10 R lateral eccentric step-down from 6" step with light L heel touch 2 x 10 R fwd eccentric step-down from 4" step with light L heel touch 2 x 10 R/L SLS + 4-way GTB SLR x 10 each direction, intermittent 1 pole A for balance BATCA knee flexion 15# x 10 B LE; 15# x 10 B con/R ecc BATCA knee extension 15# x 10 B LE; 10# x 10 B con/R ecc BATCA leg press 20# 2 x 10 B LE  MANUAL THERAPY: To promote improved flexibility, improved joint mobility, and increased ROM. R femorotibial A/P mobs with slight femur IR in standing runner's stretch position to promote increased R knee extension  MODALITIES: Game Ready vasopneumatic compression post session to R knee x 10 min, medium compression, 34 to reduce post-exercise pain and swelling/edema   10/20/2022  THERAPEUTIC EXERCISE: to improve flexibility, strength and mobility.  Demonstration, verbal and tactile cues throughout for technique. Rec bike (seat #8) - rocking/partial revolutions x 6 min RSLS 2x trials 20 seconds each time Clocks with LLE standing on RLE 3x all the way around R SLS pallof press RTB x 20 bil R SLS OTIS with RTB x 10  Squats with UE support x 10  Wall squats 2x10 3 sec hold R runner stretch x 30 sec R gastroc heel drop stretch 2x30 sec Knee ROM measured  MANUAL THERAPY: To promote normalized muscle tension, improved flexibility, improved joint mobility,  increased ROM, and reduced pain.  R patellar mobs - all directions R knee grade II-III femorotibial mobs for R knee extension  MODALITIES: Game Ready vasopneumatic compression post session to R knee x 10 min, medium compression, 34 to reduce post-exercise pain and swelling/edema   10/18/2022  THERAPEUTIC EXERCISE: to improve flexibility, strength and mobility.  Demonstration, verbal and tactile cues throughout for technique. Rec bike (seat #8) - rocking/partial revolutions x 6 min Standing R TKE into ball on wall 10 x 5", 2 sets R retro step + contralateral arm raise 2 x 10 R step stretch on 9" stool for knee flexion ROM 2 x 10, 2nd set with slight bend in L knee to lower hips for increased R knee flexion R fwd step-up to 4" step x 10, 6" step x 10; intermittent light UE support on counter R lateral step-up to 6" step x 10; light UE support on counter Seated GTB R HS curls 2 x 10 Seated R Fitter (1 black) leg press 2 x 10 Supine B HS curls with heels on peanut ball 2 x 10 - strap assist for R knee flexion AAROM Supine quad + glute set with straight leg bridge pressing heels into peanut ball 2 x 10  MANUAL THERAPY: To promote normalized muscle tension, improved flexibility, improved joint mobility, increased ROM, and reduced pain.  R patellar mobs - all directions R knee grade II-III femorotibial mobs for R knee extension STM/DTM & XFM to R distal lateral HS  MODALITIES: Game Ready vasopneumatic compression post session to R knee x 10 min, medium compression, 34 to reduce post-exercise pain and swelling/edema   PATIENT EDUCATION:  Education details: progress with PT and ongoing PT POC  Person educated: Patient  Education method: Explanation Education comprehension: verbalized understanding  HOME EXERCISE PROGRAM: *Access Code: [email or web address] URL: [email or web address] Date: 10/13/2022 Prepared by: Verta Ellen  Exercises - Supine Quad Set  - 2 x daily - 7 x weekly -  2 sets - 10 reps - Active Straight Leg Raise with Quad Set  - 2 x daily - 7 x weekly - 2 sets - 10 reps - Supine Heel Slide with Strap  - 2 x daily - 7 x weekly - 2 sets - 10 reps - Supine Hip Abduction  - 2 x daily - 7 x weekly - 2 sets - 10 reps - Supine Knee Extension Strengthening  - 2 x daily - 7 x weekly - 2 sets - 10 reps - Seated Long Arc Quad  - 2 x daily - 7 x weekly - 2 sets - 10 reps - Seated Long Arc Quad with Strap  - 2 x daily - 7 x weekly - 1 sets - 5-10 reps - 10 sec hold - Seated Quad Set  - 2 x daily - 7 x weekly - 2 sets - 10 reps - Seated Knee Flexion AAROM  - 2 x daily - 7 x weekly - 2 sets - 10 reps - Seated Hamstring Stretch with Strap (Mirrored)  - 2 x daily - 7 x weekly - 3 reps - 30 sec hold - [place]  - 1 x daily - 7 x weekly - 2 sets - 10 reps - 3 sec hold - Retro Step (Mirrored)  - 1 x daily - 7 x weekly - 2 sets - 10 reps - 3 sec hold - Prone Quadriceps Stretch with Strap  - 2 x daily - 7 x weekly - 2 sets - 3 reps - 30 sec hold - Gastroc Stretch on Wall  - 1 x daily - 7 x weekly - 2 sets - 3 reps - 30 sec hold  *Pt using MedBridge GO app   ASSESSMENT:  CLINICAL IMPRESSION: Ahliana remains pain free and reports improving activity and ambulation tolerance, able to walk in her neighborhood w/o an AD yesterday w/o any issues. Therapeutic interventions continuing to target improving R knee extension as well as quad control and balance with some fatigue noted but otherwise good tolerance. R knee ROM continues to improve with current AROM 3-104.  Avianah will benefit from continued skilled PT to address ongoing ROM, strength and balance deficits to improve mobility and activity tolerance with decreased pain interference.   OBJECTIVE IMPAIRMENTS: Abnormal gait, decreased activity tolerance, decreased balance, decreased endurance, decreased mobility, difficulty walking, decreased ROM, decreased strength, increased edema, increased fascial restrictions, impaired  sensation, and pain.   ACTIVITY LIMITATIONS: carrying, lifting, bending, sitting, standing, sleeping, stairs, transfers, and locomotion level  PARTICIPATION LIMITATIONS: meal prep, cleaning, laundry, driving, shopping, and community activity  PERSONAL FACTORS: Time since onset of injury/illness/exacerbation and 1-2 comorbidities: Chronic B knee OA, s/p R TKA 09/15/22 & L TKA 04/21/22; anxiety; h/o DVT and B PE 2017; IVC filter insertion 04/12/22  are also affecting patient's functional outcome.   REHAB POTENTIAL: Excellent  CLINICAL DECISION MAKING: Stable/uncomplicated  EVALUATION COMPLEXITY: Low   GOALS: Goals reviewed with patient? Yes  SHORT TERM GOALS: Target date: 10/26/2022  Patient will be independent with initial HEP. Baseline:  Goal status: MET  10/18/22  2.  Patient will demonstrate full (0) deg R knee extension  Baseline: lacking 5 deg passively  Goal status: IN PROGRESS  10/25/22 - R knee extension lacking 3  3.  Patient will demonstrate 100 deg R knee flexion  Baseline: 90 Goal status: MET  10/25/22 - R knee flexion 104   LONG TERM GOALS: Target date: 11/16/2022  Patient will be independent with advanced/ongoing HEP to improve outcomes and carryover.  Baseline:  Goal status: IN PROGRESS  2.  Patient will report at least 75% improvement in R knee pain to improve QOL. Baseline: 4/10 at worst Goal status: IN PROGRESS  3.  Patient will demonstrate improved R knee AROM to >/= 0-115 deg to allow for normal gait and stair mechanics. Baseline: -10-80 Goal status: IN PROGRESS  4.  Patient will demonstrate improved R quad strength to >/= 4+/5 for improved stability and ease of mobility. Baseline: 2+/5 Goal status: IN PROGRESS  5.  Patient will be able to ambulate 600' with LRAD and normal gait pattern without increased pain to access community.  Baseline: using 2WRW Goal status: IN PROGRESS  6. Patient will be able to ascend/descend stairs with 1 HR and  reciprocal step pattern safely to access home and community.  Baseline: NT Goal status: IN PROGRESS  7.  Patient will report >40/80 on LEFS to demonstrate improved functional ability. Baseline: 25/80 Goal status: IN PROGRESS  8.  Patient will demonstrate at least 19/24 on DGI to decrease risk of falls. Baseline: NT Goal status: IN PROGRESS   PLAN:  PT FREQUENCY: 2x/week  PT DURATION: 6 weeks  PLANNED INTERVENTIONS: Therapeutic exercises, Therapeutic activity, Neuromuscular re-education, Balance training, Gait training, Patient/Family education, Self Care, Joint mobilization, Stair training, Orthotic/Fit training, Electrical stimulation, Cryotherapy, Vasopneumatic device, Manual therapy, and Re-evaluation  PLAN FOR NEXT SESSION: review and progress HEP, measure ROM weekly, modalities PRN.   Dr. Kathline Magic Knee Replacement Protocol  [ X ] Evaluate and Treat -ONLY AS INDICATED [ X ] Strengthening [ X ] Stretching [ X ] Soft Tissue Mobilization [ X ] Modalities PRN Other:   Functional Guidelines: Weight Bearing Status: WBAT  Gait: Progression from use of an assistive device to non-assisted ambulation is encouraged immediately post-operatively. Progression is per physical therapist discretion, with the primary goal of avoiding gait deviations.  Emphasis should be placed on extension exercises  Patient should be educated to incorporate extension of the surgical knee in sleeping and sitting positions. (i.e. no pillows under knee when sleeping and leg elevated when sitting)  Increasing range of motion of the surgical knee should be the goal of physical therapy. The patient should achieve 0 degrees of knee extension and 100 degrees of knee flexion by three (3) weeks post-operative.  Golfing is allowed per patient tolerance  Biking is allowed per patient tolerance  Please call [phone] weekly regarding ROM if less than 90 degrees  Please send progress note with patient to all  follow-up visits   *PT to start 09/20/22 (PT start delayed due to intraoperative R patella fracture)   Marry Guan, PT 10/25/2022, 12:45 PM   [facility] [address]  [address] [place], [place], [address] Phone: [phone]   Fax:  [phone]

## 2022-10-27 ENCOUNTER — Ambulatory Visit: Payer: Federal, State, Local not specified - PPO

## 2022-10-27 DIAGNOSIS — M25661 Stiffness of right knee, not elsewhere classified: Secondary | ICD-10-CM

## 2022-10-27 DIAGNOSIS — M6281 Muscle weakness (generalized): Secondary | ICD-10-CM

## 2022-10-27 DIAGNOSIS — R262 Difficulty in walking, not elsewhere classified: Secondary | ICD-10-CM

## 2022-10-27 DIAGNOSIS — M25561 Pain in right knee: Secondary | ICD-10-CM

## 2022-10-27 DIAGNOSIS — R600 Localized edema: Secondary | ICD-10-CM

## 2022-10-27 NOTE — Therapy (Signed)
OUTPATIENT PHYSICAL THERAPY TREATMENT      Patient Name: Lori Klein MRN: [id number] DOB:[birth year], 57 y.o., female Today's Date: 10/27/2022   END OF SESSION:  PT End of Session - 10/27/22 1617     Visit Number 8    Date for PT Re-Evaluation 11/16/22    Authorization Type Federal [employer] - VL: 50 (13 used in prior PT episode)    PT Start Time 1615    PT Stop Time 1709    PT Time Calculation (min) 54 min    Activity Tolerance Patient tolerated treatment well    Behavior During Therapy WFL for tasks assessed/performed                Past Medical History:  Diagnosis Date   Anxiety    Arthritis    Blood dyscrasia    has sickle cell trait   DVT (deep venous thrombosis) (HCC)    Headache    migraine   PONV (postoperative nausea and vomiting)    Pre-diabetes    Pulmonary embolism (HCC)    Past Surgical History:  Procedure Laterality Date   CHOLECYSTECTOMY  2008   laparoscopic   IVC FILTER INSERTION N/A 04/12/2022   Procedure: IVC FILTER INSERTION;  Surgeon: Maeola Harman, MD;  Location: [facility] INVASIVE CV LAB;  Service: Cardiovascular;  Laterality: N/A;   IVC VENOGRAPHY N/A 04/12/2022   Procedure: IVC Venography;  Surgeon: Maeola Harman, MD;  Location: [facility] INVASIVE CV LAB;  Service: Cardiovascular;  Laterality: N/A;   KNEE ARTHROPLASTY Left 04/21/2022   Procedure: COMPUTER ASSISTED TOTAL KNEE ARTHROPLASTY;  Surgeon: Samson Frederic, MD;  Location: [facility]S;  Service: Orthopedics;  Laterality: Left;   KNEE ARTHROPLASTY Right 09/15/2022   Procedure: COMPUTER ASSISTED RIGHT TOTAL KNEE ARTHROPLASTY;  Surgeon: Samson Frederic, MD;  Location: [facility];  Service: Orthopedics;  Laterality: Right;   ulnar surgery Left 2017   released compressed nerve   WRIST SURGERY Right    nerve de fragmentation   Patient Active Problem List   Diagnosis Date Noted   Osteoarthritis of right knee 09/15/2022   Osteoarthritis of left knee 04/21/2022   Bilateral  pulmonary embolism (HCC) 09/27/2015   Acute deep vein thrombosis (DVT) of right lower extremity (HCC) 09/27/2015    PCP: Tiana Loft, PA-C   REFERRING PROVIDER: Clois Dupes, PA-C   REFERRING DIAG: [phone] (ICD-10-CM) - S/P TKR (total knee replacement)   THERAPY DIAG:  Stiffness of right knee, not elsewhere classified  Acute pain of right knee  Localized edema  Muscle weakness (generalized)  Difficulty in walking, not elsewhere classified  RATIONALE FOR EVALUATION AND TREATMENT: Rehabilitation  ONSET DATE: 09/15/2022 - R TKA with cemented patella due to intraoperative vertical patellar fracture   NEXT MD VISIT: 10/25/2022   SUBJECTIVE:  SUBJECTIVE STATEMENT: Pt reports feeling good, she went to Pilates yesterday and also driving herself now.   EVAL: Pt had R TKA on 09/15/22.  She had patellar fracture at the end of surgery and was in knee immobilizer for 3 weeks.  She has been ok'd to start PT, but to wear immobilizer and use walker until she has better quad control.  She has already started her exercises, the ones she could remember from her previous TKA done last March and has been working on quad sets to regain her quad control, and has stopped using the knee immobilizer.  Otherwise doing well, not a lot of pain, but she has had a lot of insomnia with this knee, only sleeping 2-3 hours.  Not from pain, just really uncomfortable.    PAIN: Are you having pain? No  PERTINENT HISTORY:  Chronic B knee OA, s/p R TKA 09/15/22 & L TKA 04/21/22; anxiety; h/o DVT and B PE 2017; IVC filter insertion 04/12/22  PRECAUTIONS: Other: history DVT  RED FLAGS: None  WEIGHT BEARING RESTRICTIONS: No  FALLS:  Has patient fallen in last 6 months? 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  PATIENT SURVEYS:  LEFS 25/80  COGNITION: Overall cognitive status: Within functional limits for tasks assessed    SENSATION: Decreased sensation on medial and lateral sides of incision.   EDEMA:   Right cm Left cm  Patella (midline) 40 38.5  4 in above patella  50 48  4 in below patella 41 40  ankles 21 21    MUSCLE LENGTH: NT  POSTURE:  No Significant postural limitations  PALPATION: No tenderness in calf.  Incision healing well, no tenderness or redness.    LOWER EXTREMITY ROM: L knee post-rehab following L TKA 0-120 (05/24/22)  Active ROM Right eval Left   eval Right 10/13/22  R 10/20/22 R 10/25/22  Knee flexion 80 115 87 seated 96 104  Knee extension -10 0 -6 supine -5 -3 supported -8 LAQ  Ankle dorsiflexion        Passive ROM Right eval Left   eval  Knee flexion 90 115  Knee extension -5 0  Ankle dorsiflexion    (Blank rows = not tested)  LOWER EXTREMITY MMT:  MMT Right eval Left   eval Right 10/27/22  Hip flexion 5 5   Hip extension 5 5   Hip abduction 5 5   Hip adduction 5 5   Knee flexion 3 5 4   Knee extension 2+ (8 deg quad lag 5 4-  Ankle dorsiflexion 5 5   Ankle plantarflexion      (Blank rows = not tested)  Edema   FUNCTIONAL TESTS:  5 times sit to stand: 16.75 seconds with bil UE assist and R leg forward.     GAIT: Distance walked: 25' Assistive device utilized: Environmental consultant -  2 wheeled Level of assistance: Modified independence Gait pattern: step through pattern Comments: decreased gait speed 0.5 m/s  TODAY'S TREATMENT:  10/27/2022  THERAPEUTIC EXERCISE: to improve flexibility, strength and mobility.  Demonstration, verbal and tactile cues throughout for technique. Rec bike (seat #8) L2x91min R knee MMT Squats 2x10- with mirror for visual feedback Wall squats with ball squeeze for VMO 5x5" Knee extension 10# 2x10 BLE Knee flexion 20# 2x10 BLE Fwd step down 4' step 2x10  Prone hang - knee extension measured in this positoin Standing R TKE blue TB 10x5" 2 sets  MANUAL THERAPY: To promote normalized muscle tension, improved flexibility, improved joint mobility, increased ROM, and reduced pain.  R patellar mobs - all directions R knee grade III-IV femorotibial mobs for R knee extension  MODALITIES: Game Ready vasopneumatic compression post session to R knee x 10 min, medium compression, 34 to reduce post-exercise pain and swelling/edema  10/25/2022  THERAPEUTIC EXERCISE: to improve flexibility, strength and mobility.  Demonstration, verbal and tactile cues throughout for technique. Rec bike (seat #9) - full revolutions x 3', L2 x 3' R blue TB TKE 10 x 5", 2 sets - cues for quad activation w/o moving hips R fwd 6"  step-up + blue TB TKE 2 x 10 R lateral eccentric step-down from 6" step with light L heel touch 2 x 10 R fwd eccentric step-down from 4" step with light L heel touch 2 x 10 R/L SLS + 4-way GTB SLR x 10 each direction, intermittent 1 pole A for balance BATCA knee flexion 15# x 10 B LE; 15# x 10 B con/R ecc BATCA knee extension 15# x 10 B LE; 10# x 10 B con/R ecc BATCA leg press 20# 2 x 10 B LE  MANUAL THERAPY: To promote improved flexibility, improved joint mobility, and increased ROM. R femorotibial A/P mobs with slight femur IR in standing runner's stretch position to promote increased R knee extension  MODALITIES: Game Ready vasopneumatic  compression post session to R knee x 10 min, medium compression, 34 to reduce post-exercise pain and swelling/edema   10/20/2022  THERAPEUTIC EXERCISE: to improve flexibility, strength and mobility.  Demonstration, verbal and tactile cues throughout for technique. Rec bike (seat #8) - rocking/partial revolutions x 6 min RSLS 2x trials 20 seconds each time Clocks with LLE standing on RLE 3x all the way around R SLS pallof press RTB x 20 bil R SLS OTIS with RTB x 10  Squats with UE support x 10  Wall squats 2x10 3 sec hold R runner stretch x 30 sec R gastroc heel drop stretch 2x30 sec Knee ROM measured  MANUAL THERAPY: To promote normalized muscle tension, improved flexibility, improved joint mobility, increased ROM, and reduced pain.  R patellar mobs - all directions R knee grade II-III femorotibial mobs for R knee extension  MODALITIES: Game Ready vasopneumatic compression post session to R knee x 10 min, medium compression, 34 to reduce post-exercise pain and swelling/edema   10/18/2022  THERAPEUTIC EXERCISE: to improve flexibility, strength and mobility.  Demonstration, verbal and tactile cues throughout for technique. Rec bike (seat #8) - rocking/partial revolutions x 6 min Standing R TKE into ball on wall 10 x 5", 2 sets R retro step + contralateral arm raise 2 x 10 R step stretch on 9" stool for knee flexion ROM 2 x 10, 2nd set with slight bend in L knee to lower hips for increased R knee flexion R fwd step-up to 4" step x 10, 6" step x 10; intermittent light UE support on counter R lateral step-up to 6" step x 10; light UE support  on counter Seated GTB R HS curls 2 x 10 Seated R Fitter (1 black) leg press 2 x 10 Supine B HS curls with heels on peanut ball 2 x 10 - strap assist for R knee flexion AAROM Supine quad + glute set with straight leg bridge pressing heels into peanut ball 2 x 10  MANUAL THERAPY: To promote normalized muscle tension, improved flexibility, improved  joint mobility, increased ROM, and reduced pain.  R patellar mobs - all directions R knee grade II-III femorotibial mobs for R knee extension STM/DTM & XFM to R distal lateral HS  MODALITIES: Game Ready vasopneumatic compression post session to R knee x 10 min, medium compression, 34 to reduce post-exercise pain and swelling/edema   PATIENT EDUCATION:  Education details: progress with PT and ongoing PT POC  Person educated: Patient Education method: Explanation Education comprehension: verbalized understanding  HOME EXERCISE PROGRAM: *Access Code: [email or web address] URL: [email or web address] Date: 10/13/2022 Prepared by: Verta Ellen  Exercises - Supine Quad Set  - 2 x daily - 7 x weekly - 2 sets - 10 reps - Active Straight Leg Raise with Quad Set  - 2 x daily - 7 x weekly - 2 sets - 10 reps - Supine Heel Slide with Strap  - 2 x daily - 7 x weekly - 2 sets - 10 reps - Supine Hip Abduction  - 2 x daily - 7 x weekly - 2 sets - 10 reps - Supine Knee Extension Strengthening  - 2 x daily - 7 x weekly - 2 sets - 10 reps - Seated Long Arc Quad  - 2 x daily - 7 x weekly - 2 sets - 10 reps - Seated Long Arc Quad with Strap  - 2 x daily - 7 x weekly - 1 sets - 5-10 reps - 10 sec hold - Seated Quad Set  - 2 x daily - 7 x weekly - 2 sets - 10 reps - Seated Knee Flexion AAROM  - 2 x daily - 7 x weekly - 2 sets - 10 reps - Seated Hamstring Stretch with Strap (Mirrored)  - 2 x daily - 7 x weekly - 3 reps - 30 sec hold - [place]  - 1 x daily - 7 x weekly - 2 sets - 10 reps - 3 sec hold - Retro Step (Mirrored)  - 1 x daily - 7 x weekly - 2 sets - 10 reps - 3 sec hold - Prone Quadriceps Stretch with Strap  - 2 x daily - 7 x weekly - 2 sets - 3 reps - 30 sec hold - Gastroc Stretch on Wall  - 1 x daily - 7 x weekly - 2 sets - 3 reps - 30 sec hold  *Pt using MedBridge GO app   ASSESSMENT:  CLINICAL IMPRESSION: R knee strength has improved but still showing some continued weakness. Pt  demonstrates good tolerance for exercises, continues to also show deficits with TKE but able to achieve 2 deg in prone hang position. Visual and verbal cues required for equal WS during squats. Good control shown with step downs on 4' step but unable to carryover to 6' step just yet. LTG #2 met. Vaso used post session to address edema. Gere will benefit from continued skilled PT to address ongoing ROM, strength and balance deficits to improve mobility and activity tolerance with decreased pain interference.   OBJECTIVE IMPAIRMENTS: Abnormal gait, decreased activity tolerance, decreased balance, decreased endurance, decreased mobility, difficulty walking, decreased ROM, decreased  strength, increased edema, increased fascial restrictions, impaired sensation, and pain.   ACTIVITY LIMITATIONS: carrying, lifting, bending, sitting, standing, sleeping, stairs, transfers, and locomotion level  PARTICIPATION LIMITATIONS: meal prep, cleaning, laundry, driving, shopping, and community activity  PERSONAL FACTORS: Time since onset of injury/illness/exacerbation and 1-2 comorbidities: Chronic B knee OA, s/p R TKA 09/15/22 & L TKA 04/21/22; anxiety; h/o DVT and B PE 2017; IVC filter insertion 04/12/22  are also affecting patient's functional outcome.   REHAB POTENTIAL: Excellent  CLINICAL DECISION MAKING: Stable/uncomplicated  EVALUATION COMPLEXITY: Low   GOALS: Goals reviewed with patient? Yes  SHORT TERM GOALS: Target date: 10/26/2022  Patient will be independent with initial HEP. Baseline:  Goal status: MET  10/18/22  2.  Patient will demonstrate full (0) deg R knee extension  Baseline: lacking 5 deg passively  Goal status: IN PROGRESS  10/25/22 - R knee extension lacking 3  3.  Patient will demonstrate 100 deg R knee flexion  Baseline: 90 Goal status: MET  10/25/22 - R knee flexion 104   LONG TERM GOALS: Target date: 11/16/2022  Patient will be independent with advanced/ongoing HEP to  improve outcomes and carryover.  Baseline:  Goal status: IN PROGRESS  2.  Patient will report at least 75% improvement in R knee pain to improve QOL. Baseline: 4/10 at worst Goal status: MET- 10/27/22 pt denies having any pain in R knee  3.  Patient will demonstrate improved R knee AROM to >/= 0-115 deg to allow for normal gait and stair mechanics. Baseline: -10-80 Goal status: IN PROGRESS  4.  Patient will demonstrate improved R quad strength to >/= 4+/5 for improved stability and ease of mobility. Baseline: 2+/5 Goal status: IN PROGRESS  5.  Patient will be able to ambulate 600' with LRAD and normal gait pattern without increased pain to access community.  Baseline: using 2WRW Goal status: IN PROGRESS  6. Patient will be able to ascend/descend stairs with 1 HR and reciprocal step pattern safely to access home and community.  Baseline: NT Goal status: IN PROGRESS  7.  Patient will report >40/80 on LEFS to demonstrate improved functional ability. Baseline: 25/80 Goal status: IN PROGRESS  8.  Patient will demonstrate at least 19/24 on DGI to decrease risk of falls. Baseline: NT Goal status: IN PROGRESS   PLAN:  PT FREQUENCY: 2x/week  PT DURATION: 6 weeks  PLANNED INTERVENTIONS: Therapeutic exercises, Therapeutic activity, Neuromuscular re-education, Balance training, Gait training, Patient/Family education, Self Care, Joint mobilization, Stair training, Orthotic/Fit training, Electrical stimulation, Cryotherapy, Vasopneumatic device, Manual therapy, and Re-evaluation  PLAN FOR NEXT SESSION: review and progress HEP, measure ROM weekly, modalities PRN.   Dr. Kathline Magic Knee Replacement Protocol  [ X ] Evaluate and Treat -ONLY AS INDICATED [ X ] Strengthening [ X ] Stretching [ X ] Soft Tissue Mobilization [ X ] Modalities PRN Other:   Functional Guidelines: Weight Bearing Status: WBAT  Gait: Progression from use of an assistive device to non-assisted ambulation is  encouraged immediately post-operatively. Progression is per physical therapist discretion, with the primary goal of avoiding gait deviations.  Emphasis should be placed on extension exercises  Patient should be educated to incorporate extension of the surgical knee in sleeping and sitting positions. (i.e. no pillows under knee when sleeping and leg elevated when sitting)  Increasing range of motion of the surgical knee should be the goal of physical therapy. The patient should achieve 0 degrees of knee extension and 100 degrees of knee flexion by three (3) weeks  post-operative.  Golfing is allowed per patient tolerance  Biking is allowed per patient tolerance  Please call [phone] weekly regarding ROM if less than 90 degrees  Please send progress note with patient to all follow-up visits   *PT to start 09/20/22 (PT start delayed due to intraoperative R patella fracture)   Darleene Cleaver, PTA 10/27/2022, 4:59 PM   [facility] [address]  [address] [place], [place], [address] Phone: [phone]   Fax:  [phone]

## 2022-11-01 ENCOUNTER — Encounter: Payer: Self-pay | Admitting: Physical Therapy

## 2022-11-01 ENCOUNTER — Ambulatory Visit: Payer: Federal, State, Local not specified - PPO | Admitting: Physical Therapy

## 2022-11-01 DIAGNOSIS — M25661 Stiffness of right knee, not elsewhere classified: Secondary | ICD-10-CM

## 2022-11-01 DIAGNOSIS — R600 Localized edema: Secondary | ICD-10-CM

## 2022-11-01 DIAGNOSIS — M6281 Muscle weakness (generalized): Secondary | ICD-10-CM

## 2022-11-01 DIAGNOSIS — R262 Difficulty in walking, not elsewhere classified: Secondary | ICD-10-CM

## 2022-11-01 DIAGNOSIS — M25561 Pain in right knee: Secondary | ICD-10-CM

## 2022-11-01 NOTE — Therapy (Signed)
OUTPATIENT PHYSICAL THERAPY TREATMENT      Patient Name: Lori Klein MRN: [id number] DOB:12/25/1965, 57 y.o., female Today's Date: 11/01/2022   END OF SESSION:  PT End of Session - 11/01/22 0802     Visit Number 9    Date for PT Re-Evaluation 11/16/22    Authorization Type Federal BC[employer] - VL: 50 (13 used in prior PT episode)    PT Start Time 0802    PT Stop Time 0857    PT Time Calculation (min) 55 min    Activity Tolerance Patient tolerated treatment well    Behavior During Therapy WFL for tasks assessed/performed                 Past Medical History:  Diagnosis Date   Anxiety    Arthritis    Blood dyscrasia    has sickle cell trait   DVT (deep venous thrombosis) (HCC)    Headache    migraine   PONV (postoperative nausea and vomiting)    Pre-diabetes    Pulmonary embolism (HCC)    Past Surgical History:  Procedure Laterality Date   CHOLECYSTECTOMY  2008   laparoscopic   IVC FILTER INSERTION N/A 04/12/2022   Procedure: IVC FILTER INSERTION;  Surgeon: Maeola Harman, MD;  Location: [facility] INVASIVE CV LAB;  Service: Cardiovascular;  Laterality: N/A;   IVC VENOGRAPHY N/A 04/12/2022   Procedure: IVC Venography;  Surgeon: Maeola Harman, MD;  Location: [facility] INVASIVE CV LAB;  Service: Cardiovascular;  Laterality: N/A;   KNEE ARTHROPLASTY Left 04/21/2022   Procedure: COMPUTER ASSISTED TOTAL KNEE ARTHROPLASTY;  Surgeon: Samson Frederic, MD;  Location: [facility] ORS;  Service: Orthopedics;  Laterality: Left;   KNEE ARTHROPLASTY Right 09/15/2022   Procedure: COMPUTER ASSISTED RIGHT TOTAL KNEE ARTHROPLASTY;  Surgeon: Samson Frederic, MD;  Location: [facility];  Service: Orthopedics;  Laterality: Right;   ulnar surgery Left 2017   released compressed nerve   WRIST SURGERY Right    nerve de fragmentation   Patient Active Problem List   Diagnosis Date Noted   Osteoarthritis of right knee 09/15/2022   Osteoarthritis of left knee 04/21/2022   Bilateral  pulmonary embolism (HCC) 09/27/2015   Acute deep vein thrombosis (DVT) of right lower extremity (HCC) 09/27/2015    PCP: Tiana Loft, PA-C   REFERRING PROVIDER: Clois Dupes, PA-C   REFERRING DIAG: [phone] (ICD-10-CM) - S/P TKR (total knee replacement)   THERAPY DIAG:  Stiffness of right knee, not elsewhere classified  Acute pain of right knee  Localized edema  Muscle weakness (generalized)  Difficulty in walking, not elsewhere classified  RATIONALE FOR EVALUATION AND TREATMENT: Rehabilitation  ONSET DATE: 09/15/2022 - R TKA with cemented patella due to intraoperative vertical patellar fracture   NEXT MD VISIT: 10/25/2022   SUBJECTIVE:  SUBJECTIVE STATEMENT: Pt feels like she is struggling to get her knee straight but otherwise no problems.   EVAL: Pt had R TKA on 09/15/22.  She had patellar fracture at the end of surgery and was in knee immobilizer for 3 weeks.  She has been ok'd to start PT, but to wear immobilizer and use walker until she has better quad control.  She has already started her exercises, the ones she could remember from her previous TKA done last March and has been working on quad sets to regain her quad control, and has stopped using the knee immobilizer.  Otherwise doing well, not a lot of pain, but she has had a lot of insomnia with this knee, only sleeping 2-3 hours.  Not from pain, just really uncomfortable.    PAIN: Are you having pain? No  PERTINENT HISTORY:  Chronic B knee OA, s/p R TKA 09/15/22 & L TKA 04/21/22; anxiety; h/o DVT and B PE 2017; IVC filter insertion 04/12/22  PRECAUTIONS: Other: history DVT  RED FLAGS: None  WEIGHT BEARING RESTRICTIONS: No  FALLS:  Has patient fallen in last 6 months? 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  PATIENT SURVEYS:  LEFS 25/80  COGNITION: Overall cognitive status: Within functional limits for tasks assessed    SENSATION: Decreased sensation on medial and lateral sides of incision.   EDEMA:   Right cm Left cm  Patella (midline) 40 38.5  4 in above patella  50 48  4 in below patella 41 40  ankles 21 21    MUSCLE LENGTH: NT  POSTURE:  No Significant postural limitations  PALPATION: No tenderness in calf.  Incision healing well, no tenderness or redness.    LOWER EXTREMITY ROM: L knee post-rehab following L TKA 0-120 (05/24/22)  Active ROM Right eval Left   eval Right 10/13/22 R 10/20/22 R 10/25/22 R 11/01/22  Knee flexion 80 115 87 seated 96 104 109  Knee extension -10 0 -6 supine -5 -3 supported -8 LAQ -1 supported  Ankle dorsiflexion         Passive ROM Right eval Left   eval  Knee flexion 90 115  Knee extension -5 0  Ankle dorsiflexion    (Blank rows = not tested)  LOWER EXTREMITY MMT:  MMT Right eval Left   eval Right 10/27/22  Hip flexion 5 5   Hip extension 5 5   Hip abduction 5 5   Hip adduction 5 5   Knee flexion 3 5 4   Knee extension 2+ (8 deg quad lag 5 4-  Ankle dorsiflexion 5 5   Ankle plantarflexion      (Blank rows = not tested)  Edema   FUNCTIONAL TESTS:  5 times sit to stand: 16.75 seconds with bil UE assist and R leg forward.     GAIT: Distance walked: 71' Assistive  device utilized: Environmental consultant - 2 wheeled Level of assistance: Modified independence Gait pattern: step through pattern Comments: decreased  gait speed 0.5 m/s   TODAY'S TREATMENT:   11/01/2022  THERAPEUTIC EXERCISE: to improve flexibility, strength and mobility.  Demonstration, verbal and tactile cues throughout for technique. Rec bike (seat #8) L2 x 6 min R gastroc stretch 2 x 60" R seated HS stretch 2 x 30-60" R fwd 6" step-up + blue TB TKE 2 x 10 R lateral eccentric step-down from 6" step with light L heel touch 2 x 10 - cues to maintain level pelvis rather than dropping at hip R fwd eccentric step-down from 6" step with light L heel touch x 10 - cues to maintain level pelvis rather than dropping at hip TRX triple extension squat 2 x 10 Prone hang x 4 min - 2 min w/o added weight, 2 x 1 min with 3# cuff weight at ankle - PT using FR on HS while in prone hang Supine R quad set + leg lengthener with heel on FR 10 x 3"  MANUAL THERAPY: To promote normalized muscle tension, improved flexibility, improved joint mobility, and increased ROM. IASTM with FR to HS in prone hang R knee grade III-IV femorotibial mobs for R knee extension in supine   10/27/2022  THERAPEUTIC EXERCISE: to improve flexibility, strength and mobility.  Demonstration, verbal and tactile cues throughout for technique. Rec bike (seat #8) L2x25min R knee MMT Squats 2x10- with mirror for visual feedback Wall squats with ball squeeze for VMO 5x5" Knee extension 10# 2x10 BLE Knee flexion 20# 2x10 BLE Fwd step down 4' step 2x10  Prone hang - knee extension measured in this positoin Standing R TKE blue TB 10x5" 2 sets  MANUAL THERAPY: To promote normalized muscle tension, improved flexibility, improved joint mobility, increased ROM, and reduced pain.  R patellar mobs - all directions R knee grade III-IV femorotibial mobs for R knee extension  MODALITIES: Game Ready vasopneumatic compression post session to R knee x 10 min,  medium compression, 34 to reduce post-exercise pain and swelling/edema   10/25/2022  THERAPEUTIC EXERCISE: to improve flexibility, strength and mobility.  Demonstration, verbal and tactile cues throughout for technique. Rec bike (seat #9) - full revolutions x 3', L2 x 3' R blue TB TKE 10 x 5", 2 sets - cues for quad activation w/o moving hips R fwd 6"  step-up + blue TB TKE 2 x 10 R lateral eccentric step-down from 6" step with light L heel touch 2 x 10 R fwd eccentric step-down from 4" step with light L heel touch 2 x 10 R/L SLS + 4-way GTB SLR x 10 each direction, intermittent 1 pole A for balance BATCA knee flexion 15# x 10 B LE; 15# x 10 B con/R ecc BATCA knee extension 15# x 10 B LE; 10# x 10 B con/R ecc BATCA leg press 20# 2 x 10 B LE  MANUAL THERAPY: To promote improved flexibility, improved joint mobility, and increased ROM. R femorotibial A/P mobs with slight femur IR in standing runner's stretch position to promote increased R knee extension  MODALITIES: Game Ready vasopneumatic compression post session to R knee x 10 min, medium compression, 34 to reduce post-exercise pain and swelling/edema   PATIENT EDUCATION:  Education details: progress with PT and ongoing PT POC  Person educated: Patient Education method: Explanation Education comprehension: verbalized understanding  HOME EXERCISE PROGRAM: *Access Code: [email or web address] URL: [email or web address] Date: 10/13/2022 Prepared by: Verta Ellen  Exercises - Supine Quad Set  - 2 x daily - 7 x weekly - 2 sets - 10 reps - Active  Straight Leg Raise with Quad Set  - 2 x daily - 7 x weekly - 2 sets - 10 reps - Supine Heel Slide with Strap  - 2 x daily - 7 x weekly - 2 sets - 10 reps - Supine Hip Abduction  - 2 x daily - 7 x weekly - 2 sets - 10 reps - Supine Knee Extension Strengthening  - 2 x daily - 7 x weekly - 2 sets - 10 reps - Seated Long Arc Quad  - 2 x daily - 7 x weekly - 2 sets - 10 reps - Seated Long  Arc Quad with Strap  - 2 x daily - 7 x weekly - 1 sets - 5-10 reps - 10 sec hold - Seated Quad Set  - 2 x daily - 7 x weekly - 2 sets - 10 reps - Seated Knee Flexion AAROM  - 2 x daily - 7 x weekly - 2 sets - 10 reps - Seated Hamstring Stretch with Strap (Mirrored)  - 2 x daily - 7 x weekly - 3 reps - 30 sec hold - [place]  - 1 x daily - 7 x weekly - 2 sets - 10 reps - 3 sec hold - Retro Step (Mirrored)  - 1 x daily - 7 x weekly - 2 sets - 10 reps - 3 sec hold - Prone Quadriceps Stretch with Strap  - 2 x daily - 7 x weekly - 2 sets - 3 reps - 30 sec hold - Gastroc Stretch on Wall  - 1 x daily - 7 x weekly - 2 sets - 3 reps - 30 sec hold - Wall Squat  - 1 x daily - 7 x weekly - 3 sets - 10 reps - Prone Knee Extension Hang  - 2 x daily - 7 x weekly - 3-5 min hold  *Pt using MedBridge GO app   ASSESSMENT:  CLINICAL IMPRESSION: Latrinity reports her biggest concern is trying to get her R knee fully straight. Therapeutic exercises focusing on stretching and quad strengthening especially in TKE to facilitate improved stability and functional R knee extension. R knee ROM continues to improve with current AROM -1-109. Nan will benefit from continued skilled PT to address ongoing ROM, strength and balance deficits to improve mobility and activity tolerance with decreased pain interference.   OBJECTIVE IMPAIRMENTS: Abnormal gait, decreased activity tolerance, decreased balance, decreased endurance, decreased mobility, difficulty walking, decreased ROM, decreased strength, increased edema, increased fascial restrictions, impaired sensation, and pain.   ACTIVITY LIMITATIONS: carrying, lifting, bending, sitting, standing, sleeping, stairs, transfers, and locomotion level  PARTICIPATION LIMITATIONS: meal prep, cleaning, laundry, driving, shopping, and community activity  PERSONAL FACTORS: Time since onset of injury/illness/exacerbation and 1-2 comorbidities: Chronic B knee OA, s/p R TKA 09/15/22 &  L TKA 04/21/22; anxiety; h/o DVT and B PE 2017; IVC filter insertion 04/12/22  are also affecting patient's functional outcome.   REHAB POTENTIAL: Excellent  CLINICAL DECISION MAKING: Stable/uncomplicated  EVALUATION COMPLEXITY: Low   GOALS: Goals reviewed with patient? Yes  SHORT TERM GOALS: Target date: 10/26/2022  Patient will be independent with initial HEP. Baseline:  Goal status: MET  10/18/22  2.  Patient will demonstrate full (0) deg R knee extension  Baseline: lacking 5 deg passively  Goal status: IN PROGRESS  10/25/22 - R knee extension lacking 3  3.  Patient will demonstrate 100 deg R knee flexion  Baseline: 90 Goal status: MET  10/25/22 - R knee flexion 104  LONG TERM GOALS: Target date: 11/16/2022  Patient will be independent with advanced/ongoing HEP to improve outcomes and carryover.  Baseline:  Goal status: IN PROGRESS - 11/01/22 - Met for current HEP  2.  Patient will report at least 75% improvement in R knee pain to improve QOL. Baseline: 4/10 at worst Goal status: MET - 10/27/22 pt denies having any pain in R knee  3.  Patient will demonstrate improved R knee AROM to >/= 0-115 deg to allow for normal gait and stair mechanics. Baseline: -10-80 Goal status: IN PROGRESS - 11/01/22 - AROM -1-109  4.  Patient will demonstrate improved R quad strength to >/= 4+/5 for improved stability and ease of mobility. Baseline: 2+/5 Goal status: IN PROGRESS  5.  Patient will be able to ambulate 600' with LRAD and normal gait pattern without increased pain to access community.  Baseline: using 2WRW Goal status: IN PROGRESS  6. Patient will be able to ascend/descend stairs with 1 HR and reciprocal step pattern safely to access home and community.  Baseline: NT Goal status: IN PROGRESS  7.  Patient will report >40/80 on LEFS to demonstrate improved functional ability. Baseline: 25/80 Goal status: IN PROGRESS  8.  Patient will demonstrate at least 19/24 on DGI to  decrease risk of falls. Baseline: NT Goal status: IN PROGRESS   PLAN:  PT FREQUENCY: 2x/week  PT DURATION: 6 weeks  PLANNED INTERVENTIONS: Therapeutic exercises, Therapeutic activity, Neuromuscular re-education, Balance training, Gait training, Patient/Family education, Self Care, Joint mobilization, Stair training, Orthotic/Fit training, Electrical stimulation, Cryotherapy, Vasopneumatic device, Manual therapy, and Re-evaluation  PLAN FOR NEXT SESSION: review and progress HEP, measure ROM weekly, modalities PRN.   Dr. Kathline Magic Knee Replacement Protocol  [ X ] Evaluate and Treat -ONLY AS INDICATED [ X ] Strengthening [ X ] Stretching [ X ] Soft Tissue Mobilization [ X ] Modalities PRN Other:   Functional Guidelines: Weight Bearing Status: WBAT  Gait: Progression from use of an assistive device to non-assisted ambulation is encouraged immediately post-operatively. Progression is per physical therapist discretion, with the primary goal of avoiding gait deviations.  Emphasis should be placed on extension exercises  Patient should be educated to incorporate extension of the surgical knee in sleeping and sitting positions. (i.e. no pillows under knee when sleeping and leg elevated when sitting)  Increasing range of motion of the surgical knee should be the goal of physical therapy. The patient should achieve 0 degrees of knee extension and 100 degrees of knee flexion by three (3) weeks post-operative.  Golfing is allowed per patient tolerance  Biking is allowed per patient tolerance  Please call [phone] weekly regarding ROM if less than 90 degrees  Please send progress note with patient to all follow-up visits   *PT to start 09/20/22 (PT start delayed due to intraoperative R patella fracture)   Marry Guan, PT 11/01/2022, 8:49 AM   [facility] [address]  [address] [place], [place], [address] Phone: [phone]    Fax:  [phone]

## 2022-11-03 ENCOUNTER — Ambulatory Visit: Payer: Federal, State, Local not specified - PPO | Attending: Student

## 2022-11-03 DIAGNOSIS — R600 Localized edema: Secondary | ICD-10-CM | POA: Insufficient documentation

## 2022-11-03 DIAGNOSIS — R262 Difficulty in walking, not elsewhere classified: Secondary | ICD-10-CM | POA: Diagnosis present

## 2022-11-03 DIAGNOSIS — M6281 Muscle weakness (generalized): Secondary | ICD-10-CM | POA: Insufficient documentation

## 2022-11-03 DIAGNOSIS — M25561 Pain in right knee: Secondary | ICD-10-CM | POA: Diagnosis present

## 2022-11-03 DIAGNOSIS — M25661 Stiffness of right knee, not elsewhere classified: Secondary | ICD-10-CM | POA: Diagnosis present

## 2022-11-03 NOTE — Therapy (Signed)
OUTPATIENT PHYSICAL THERAPY TREATMENT      Patient Name: Lori Klein MRN: [id number] DOB:05-Mar-1965, 57 y.o., female Today's Date: 11/03/2022   END OF SESSION:  PT End of Session - 11/03/22 1707     Visit Number 10    Date for PT Re-Evaluation 11/16/22    Authorization Type Federal [employer] - VL: 50 (13 used in prior PT episode)    PT Start Time 1619    PT Stop Time 1710    PT Time Calculation (min) 51 min    Activity Tolerance Patient tolerated treatment well    Behavior During Therapy WFL for tasks assessed/performed                  Past Medical History:  Diagnosis Date   Anxiety    Arthritis    Blood dyscrasia    has sickle cell trait   DVT (deep venous thrombosis) (HCC)    Headache    migraine   PONV (postoperative nausea and vomiting)    Pre-diabetes    Pulmonary embolism (HCC)    Past Surgical History:  Procedure Laterality Date   CHOLECYSTECTOMY  2008   laparoscopic   IVC FILTER INSERTION N/A 04/12/2022   Procedure: IVC FILTER INSERTION;  Surgeon: Maeola Harman, MD;  Location: [facility] INVASIVE CV LAB;  Service: Cardiovascular;  Laterality: N/A;   IVC VENOGRAPHY N/A 04/12/2022   Procedure: IVC Venography;  Surgeon: Maeola Harman, MD;  Location: [facility] INVASIVE CV LAB;  Service: Cardiovascular;  Laterality: N/A;   KNEE ARTHROPLASTY Left 04/21/2022   Procedure: COMPUTER ASSISTED TOTAL KNEE ARTHROPLASTY;  Surgeon: Samson Frederic, MD;  Location: [facility] ORS;  Service: Orthopedics;  Laterality: Left;   KNEE ARTHROPLASTY Right 09/15/2022   Procedure: COMPUTER ASSISTED RIGHT TOTAL KNEE ARTHROPLASTY;  Surgeon: Samson Frederic, MD;  Location: [facility] OR[facility];  Service: Orthopedics;  Laterality: Right;   ulnar surgery Left 2017   released compressed nerve   WRIST SURGERY Right    nerve de fragmentation   Patient Active Problem List   Diagnosis Date Noted   Osteoarthritis of right knee 09/15/2022   Osteoarthritis of left knee 04/21/2022   Bilateral  pulmonary embolism (HCC) 09/27/2015   Acute deep vein thrombosis (DVT) of right lower extremity (HCC) 09/27/2015    PCP: Tiana Loft, PA-C   REFERRING PROVIDER: Clois Dupes, PA-C   REFERRING DIAG: [phone] (ICD-10-CM) - S/P TKR (total knee replacement)   THERAPY DIAG:  Stiffness of right knee, not elsewhere classified  Acute pain of right knee  Localized edema  Muscle weakness (generalized)  Difficulty in walking, not elsewhere classified  RATIONALE FOR EVALUATION AND TREATMENT: Rehabilitation  ONSET DATE: 09/15/2022 - R TKA with cemented patella due to intraoperative vertical patellar fracture   NEXT MD VISIT: 10/25/2022   SUBJECTIVE:  SUBJECTIVE STATEMENT: No new complaints.  EVAL: Pt had R TKA on 09/15/22.  She had patellar fracture at the end of surgery and was in knee immobilizer for 3 weeks.  She has been ok'd to start PT, but to wear immobilizer and use walker until she has better quad control.  She has already started her exercises, the ones she could remember from her previous TKA done last March and has been working on quad sets to regain her quad control, and has stopped using the knee immobilizer.  Otherwise doing well, not a lot of pain, but she has had a lot of insomnia with this knee, only sleeping 2-3 hours.  Not from pain, just really uncomfortable.    PAIN: Are you having pain? No  PERTINENT HISTORY:  Chronic B knee OA, s/p R TKA 09/15/22 & L TKA 04/21/22; anxiety; h/o DVT and B PE 2017; IVC filter insertion 04/12/22  PRECAUTIONS: Other: history DVT  RED FLAGS: None  WEIGHT BEARING RESTRICTIONS: No  FALLS:  Has patient fallen in last 6 months? 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  PATIENT SURVEYS:  LEFS 25/80  COGNITION: Overall cognitive status: Within functional limits for tasks assessed    SENSATION: Decreased sensation on medial and lateral sides of incision.   EDEMA:   Right cm Left cm  Patella (midline) 40 38.5  4 in above patella  50 48  4 in below patella 41 40  ankles 21 21    MUSCLE LENGTH: NT  POSTURE:  No Significant postural limitations  PALPATION: No tenderness in calf.  Incision healing well, no tenderness or redness.    LOWER EXTREMITY ROM: L knee post-rehab following L TKA 0-120 (05/24/22)  Active ROM Right eval Left   eval Right 10/13/22 R 10/20/22 R 10/25/22 R 11/01/22  Knee flexion 80 115 87 seated 96 104 109  Knee extension -10 0 -6 supine -5 -3 supported -8 LAQ -1 supported  Ankle dorsiflexion         Passive ROM Right eval Left   eval  Knee flexion 90 115  Knee extension -5 0  Ankle dorsiflexion    (Blank rows = not tested)  LOWER EXTREMITY MMT:  MMT Right eval Left   eval Right 10/27/22  Hip flexion 5 5   Hip extension 5 5   Hip abduction 5 5   Hip adduction 5 5   Knee flexion 3 5 4   Knee extension 2+ (8 deg quad lag 5 4-  Ankle dorsiflexion 5 5   Ankle plantarflexion      (Blank rows = not tested)  Edema   FUNCTIONAL TESTS:  5 times sit to stand: 16.75 seconds with bil UE assist and R leg forward.     GAIT: Distance walked: 75' Assistive device utilized: Environmental consultant - 2 wheeled Level of assistance: Modified  independence Gait pattern: step through pattern Comments: decreased gait speed 0.5 m/s   TODAY'S TREATMENT:  11/03/2022  THERAPEUTIC EXERCISE:  to improve flexibility, strength and mobility.  Demonstration, verbal and tactile cues throughout for technique. Rec bike (seat #8) L2 x 6 min 600' gait with no AD, no visible deviations Slider single leg squat RLE 2x10 Side slider lunge RLE 2x10 Conventional lunge with slider 2x10 Wall squat with VMO ball squeeze 2x10 Knee extension 10# 2x10 B con/RLE ecc R knee AROM: 1-111 deg   11/01/2022  THERAPEUTIC EXERCISE: to improve flexibility, strength and mobility.  Demonstration, verbal and tactile cues throughout for technique. Rec bike (seat #8) L2 x 6 min R gastroc stretch 2 x 60" R seated HS stretch 2 x 30-60" R fwd 6" step-up + blue TB TKE 2 x 10 R lateral eccentric step-down from 6" step with light L heel touch 2 x 10 - cues to maintain level pelvis rather than dropping at hip R fwd eccentric step-down from 6" step with light L heel touch x 10 - cues to maintain level pelvis rather than dropping at hip TRX triple extension squat 2 x 10 Prone hang x 4 min - 2 min w/o added weight, 2 x 1 min with 3# cuff weight at ankle - PT using FR on HS while in prone hang Supine R quad set + leg lengthener with heel on FR 10 x 3"  MANUAL THERAPY: To promote normalized muscle tension, improved flexibility, improved joint mobility, and increased ROM. IASTM with FR to HS in prone hang R knee grade III-IV femorotibial mobs for R knee extension in supine   10/27/2022  THERAPEUTIC EXERCISE: to improve flexibility, strength and mobility.  Demonstration, verbal and tactile cues throughout for technique. Rec bike (seat #8) L2x17min R knee MMT Squats 2x10- with mirror for visual feedback Wall squats with ball squeeze for VMO 5x5" Knee extension 10# 2x10 BLE Knee flexion 20# 2x10 BLE Fwd step down 4' step 2x10  Prone hang - knee extension measured in this  positoin Standing R TKE blue TB 10x5" 2 sets  MANUAL THERAPY: To promote normalized muscle tension, improved flexibility, improved joint mobility, increased ROM, and reduced pain.  R patellar mobs - all directions R knee grade III-IV femorotibial mobs for R knee extension  MODALITIES: Game Ready vasopneumatic compression post session to R knee x 10 min, medium compression, 34 to reduce post-exercise pain and swelling/edema   10/25/2022  THERAPEUTIC EXERCISE: to improve flexibility, strength and mobility.  Demonstration, verbal and tactile cues throughout for technique. Rec bike (seat #9) - full revolutions x 3', L2 x 3' R blue TB TKE 10 x 5", 2 sets - cues for quad activation w/o moving hips R fwd 6"  step-up + blue TB TKE 2 x 10 R lateral eccentric step-down from 6" step with light L heel touch 2 x 10 R fwd eccentric step-down from 4" step with light L heel touch 2 x 10 R/L SLS + 4-way GTB SLR x 10 each direction, intermittent 1 pole A for balance BATCA knee flexion 15# x 10 B LE; 15# x 10 B con/R ecc BATCA knee extension 15# x 10 B LE; 10# x 10 B con/R ecc BATCA leg press 20# 2 x 10 B LE  MANUAL THERAPY: To promote improved flexibility, improved joint mobility, and increased ROM. R femorotibial A/P mobs with slight femur IR in standing runner's stretch position to promote increased R knee extension  MODALITIES: Game Ready vasopneumatic compression post session to R knee x 10 min, medium compression, 34 to reduce post-exercise pain and swelling/edema   PATIENT EDUCATION:  Education details: progress with PT and ongoing PT POC  Person educated: Patient Education method: Explanation Education comprehension: verbalized understanding  HOME EXERCISE PROGRAM: *Access Code: [email or web address] URL: [email or web address] Date: 10/13/2022 Prepared by: Verta Ellen  Exercises - Supine Quad Set  - 2 x daily - 7 x weekly - 2 sets - 10 reps - Active Straight Leg Raise with Quad  Set  - 2 x daily - 7 x weekly - 2 sets - 10 reps - Supine Heel Slide with Strap  - 2 x daily - 7 x weekly - 2 sets - 10 reps - Supine Hip Abduction  - 2 x daily - 7 x weekly - 2 sets - 10 reps - Supine Knee Extension Strengthening  - 2 x daily - 7 x weekly - 2 sets - 10 reps - Seated Long Arc Quad  - 2 x daily - 7 x weekly - 2 sets - 10 reps - Seated Long Arc Quad with Strap  - 2 x daily - 7 x weekly - 1 sets - 5-10 reps - 10 sec hold - Seated Quad Set  - 2 x daily - 7 x weekly - 2 sets - 10 reps - Seated Knee Flexion AAROM  - 2 x daily - 7 x weekly - 2 sets - 10 reps - Seated Hamstring Stretch with Strap (Mirrored)  - 2 x daily - 7 x weekly - 3 reps - 30 sec hold - [place]  - 1 x daily - 7 x weekly - 2 sets - 10 reps - 3 sec hold - Retro Step (Mirrored)  - 1 x daily - 7 x weekly - 2 sets - 10 reps - 3 sec hold - Prone Quadriceps Stretch with Strap  - 2 x daily - 7 x weekly - 2 sets - 3 reps - 30 sec hold - Gastroc Stretch on Wall  - 1 x daily - 7 x weekly - 2 sets - 3 reps - 30 sec hold - Wall Squat  - 1 x daily - 7 x weekly - 3 sets - 10 reps - Prone Knee Extension Hang  - 2 x daily - 7 x weekly - 3-5 min hold  *Pt using MedBridge GO app   ASSESSMENT:  CLINICAL IMPRESSION: Annya continues to demonstrate weakness with eccentric step downs. She is unable to demonstrate step downs w/o compensating hips, so we focused on slider lunges which helped to keep good leveled hips. AROM did improve showing increased knee flexion. She is able to ambulate for 600' with good gait pattern meeting her LTG.GR used post visit to address pain.  Berlene will benefit from continued skilled PT to address ongoing ROM, strength and balance deficits to improve mobility and activity tolerance with decreased pain interference.   OBJECTIVE IMPAIRMENTS: Abnormal gait, decreased activity tolerance, decreased balance, decreased endurance, decreased mobility, difficulty walking, decreased ROM, decreased strength,  increased edema, increased fascial restrictions, impaired sensation, and pain.   ACTIVITY LIMITATIONS: carrying, lifting, bending, sitting, standing, sleeping, stairs, transfers, and locomotion level  PARTICIPATION LIMITATIONS: meal prep, cleaning, laundry, driving, shopping, and community activity  PERSONAL FACTORS: Time since onset of injury/illness/exacerbation and 1-2 comorbidities: Chronic B knee OA, s/p R TKA 09/15/22 & L TKA 04/21/22; anxiety; h/o DVT and B PE 2017; IVC filter insertion 04/12/22  are also affecting patient's functional outcome.   REHAB POTENTIAL: Excellent  CLINICAL DECISION MAKING: Stable/uncomplicated  EVALUATION COMPLEXITY: Low   GOALS: Goals reviewed with patient? Yes  SHORT TERM GOALS:  Target date: 10/26/2022  Patient will be independent with initial HEP. Baseline:  Goal status: MET  10/18/22  2.  Patient will demonstrate full (0) deg R knee extension  Baseline: lacking 5 deg passively  Goal status: IN PROGRESS  10/25/22 - R knee extension lacking 3  3.  Patient will demonstrate 100 deg R knee flexion  Baseline: 90 Goal status: MET  10/25/22 - R knee flexion 104   LONG TERM GOALS: Target date: 11/16/2022  Patient will be independent with advanced/ongoing HEP to improve outcomes and carryover.  Baseline:  Goal status: IN PROGRESS - 11/01/22 - Met for current HEP  2.  Patient will report at least 75% improvement in R knee pain to improve QOL. Baseline: 4/10 at worst Goal status: MET - 10/27/22 pt denies having any pain in R knee  3.  Patient will demonstrate improved R knee AROM to >/= 0-115 deg to allow for normal gait and stair mechanics. Baseline: -10-80 Goal status: IN PROGRESS - 11/01/22 - AROM -1-109  4.  Patient will demonstrate improved R quad strength to >/= 4+/5 for improved stability and ease of mobility. Baseline: 2+/5 Goal status: IN PROGRESS  5.  Patient will be able to ambulate 600' with LRAD and normal gait pattern without  increased pain to access community.  Baseline: using 2WRW Goal status: MET- 11/03/22  6. Patient will be able to ascend/descend stairs with 1 HR and reciprocal step pattern safely to access home and community.  Baseline: NT Goal status: IN PROGRESS  7.  Patient will report >40/80 on LEFS to demonstrate improved functional ability. Baseline: 25/80 Goal status: IN PROGRESS  8.  Patient will demonstrate at least 19/24 on DGI to decrease risk of falls. Baseline: NT Goal status: IN PROGRESS   PLAN:  PT FREQUENCY: 2x/week  PT DURATION: 6 weeks  PLANNED INTERVENTIONS: Therapeutic exercises, Therapeutic activity, Neuromuscular re-education, Balance training, Gait training, Patient/Family education, Self Care, Joint mobilization, Stair training, Orthotic/Fit training, Electrical stimulation, Cryotherapy, Vasopneumatic device, Manual therapy, and Re-evaluation  PLAN FOR NEXT SESSION: review and progress HEP, measure ROM weekly, modalities PRN.   Dr. Kathline Magic Knee Replacement Protocol  [ X ] Evaluate and Treat -ONLY AS INDICATED [ X ] Strengthening [ X ] Stretching [ X ] Soft Tissue Mobilization [ X ] Modalities PRN Other:   Functional Guidelines: Weight Bearing Status: WBAT  Gait: Progression from use of an assistive device to non-assisted ambulation is encouraged immediately post-operatively. Progression is per physical therapist discretion, with the primary goal of avoiding gait deviations.  Emphasis should be placed on extension exercises  Patient should be educated to incorporate extension of the surgical knee in sleeping and sitting positions. (i.e. no pillows under knee when sleeping and leg elevated when sitting)  Increasing range of motion of the surgical knee should be the goal of physical therapy. The patient should achieve 0 degrees of knee extension and 100 degrees of knee flexion by three (3) weeks post-operative.  Golfing is allowed per patient tolerance  Biking is  allowed per patient tolerance  Please call [phone] weekly regarding ROM if less than 90 degrees  Please send progress note with patient to all follow-up visits   *PT to start 09/20/22 (PT start delayed due to intraoperative R patella fracture)   Darleene Cleaver, PTA 11/03/2022, 5:11 PM   [facility] [address]  [address] [place], [place], [address] Phone: [phone]   Fax:  [phone]

## 2022-11-08 ENCOUNTER — Ambulatory Visit: Payer: Federal, State, Local not specified - PPO

## 2022-11-08 DIAGNOSIS — R600 Localized edema: Secondary | ICD-10-CM

## 2022-11-08 DIAGNOSIS — M25561 Pain in right knee: Secondary | ICD-10-CM

## 2022-11-08 DIAGNOSIS — M25661 Stiffness of right knee, not elsewhere classified: Secondary | ICD-10-CM | POA: Diagnosis not present

## 2022-11-08 DIAGNOSIS — M6281 Muscle weakness (generalized): Secondary | ICD-10-CM

## 2022-11-08 DIAGNOSIS — R262 Difficulty in walking, not elsewhere classified: Secondary | ICD-10-CM

## 2022-11-08 NOTE — Therapy (Signed)
OUTPATIENT PHYSICAL THERAPY TREATMENT      Patient Name: Lori Klein MRN: [id number] DOB:08-12-1965, 57 y.o., female Today's Date: 11/08/2022   END OF SESSION:  PT End of Session - 11/08/22 1540     Visit Number 11    Date for PT Re-Evaluation 11/16/22    Authorization Type Federal [employer] - VL: 50 (13 used in prior PT episode)    PT Start Time 1535    PT Stop Time 1626    PT Time Calculation (min) 51 min    Activity Tolerance Patient tolerated treatment well    Behavior During Therapy WFL for tasks assessed/performed                   Past Medical History:  Diagnosis Date   Anxiety    Arthritis    Blood dyscrasia    has sickle cell trait   DVT (deep venous thrombosis) (HCC)    Headache    migraine   PONV (postoperative nausea and vomiting)    Pre-diabetes    Pulmonary embolism (HCC)    Past Surgical History:  Procedure Laterality Date   CHOLECYSTECTOMY  2008   laparoscopic   IVC FILTER INSERTION N/A 04/12/2022   Procedure: IVC FILTER INSERTION;  Surgeon: Maeola Harman, MD;  Location: [facility] INVASIVE CV LAB;  Service: Cardiovascular;  Laterality: N/A;   IVC VENOGRAPHY N/A 04/12/2022   Procedure: IVC Venography;  Surgeon: Maeola Harman, MD;  Location: [facility] INVASIVE CV LAB;  Service: Cardiovascular;  Laterality: N/A;   KNEE ARTHROPLASTY Left 04/21/2022   Procedure: COMPUTER ASSISTED TOTAL KNEE ARTHROPLASTY;  Surgeon: Samson Frederic, MD;  Location: [facility];  Service: Orthopedics;  Laterality: Left;   KNEE ARTHROPLASTY Right 09/15/2022   Procedure: COMPUTER ASSISTED RIGHT TOTAL KNEE ARTHROPLASTY;  Surgeon: Samson Frederic, MD;  Location: [facility];  Service: Orthopedics;  Laterality: Right;   ulnar surgery Left 2017   released compressed nerve   WRIST SURGERY Right    nerve de fragmentation   Patient Active Problem List   Diagnosis Date Noted   Osteoarthritis of right knee 09/15/2022   Osteoarthritis of left knee 04/21/2022    Bilateral pulmonary embolism (HCC) 09/27/2015   Acute deep vein thrombosis (DVT) of right lower extremity (HCC) 09/27/2015    PCP: Tiana Loft, PA-C   REFERRING PROVIDER: Clois Dupes, PA-C   REFERRING DIAG: [phone] (ICD-10-CM) - S/P TKR (total knee replacement)   THERAPY DIAG:  Stiffness of right knee, not elsewhere classified  Acute pain of right knee  Localized edema  Muscle weakness (generalized)  Difficulty in walking, not elsewhere classified  RATIONALE FOR EVALUATION AND TREATMENT: Rehabilitation  ONSET DATE: 09/15/2022 - R TKA with cemented patella due to intraoperative vertical patellar fracture   NEXT MD VISIT: 10/25/2022   SUBJECTIVE:  SUBJECTIVE STATEMENT: Pt reports no changes today.   EVAL: Pt had R TKA on 09/15/22.  She had patellar fracture at the end of surgery and was in knee immobilizer for 3 weeks.  She has been ok'd to start PT, but to wear immobilizer and use walker until she has better quad control.  She has already started her exercises, the ones she could remember from her previous TKA done last March and has been working on quad sets to regain her quad control, and has stopped using the knee immobilizer.  Otherwise doing well, not a lot of pain, but she has had a lot of insomnia with this knee, only sleeping 2-3 hours.  Not from pain, just really uncomfortable.    PAIN: Are you having pain? No  PERTINENT HISTORY:  Chronic B knee OA, s/p R TKA 09/15/22 & L TKA 04/21/22; anxiety; h/o DVT and B PE 2017; IVC filter insertion 04/12/22  PRECAUTIONS: Other: history DVT  RED FLAGS: None  WEIGHT BEARING RESTRICTIONS: No  FALLS:  Has patient fallen in last 6 months? 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  PATIENT SURVEYS:  LEFS 25/80  COGNITION: Overall cognitive status: Within functional limits for tasks assessed    SENSATION: Decreased sensation on medial and lateral sides of incision.   EDEMA:   Right cm Left cm  Patella (midline) 40 38.5  4 in above patella  50 48  4 in below patella 41 40  ankles 21 21    MUSCLE LENGTH: NT  POSTURE:  No Significant postural limitations  PALPATION: No tenderness in calf.  Incision healing well, no tenderness or redness.    LOWER EXTREMITY ROM: L knee post-rehab following L TKA 0-120 (05/24/22)  Active ROM Right eval Left   eval Right 10/13/22 R 10/20/22 R 10/25/22 R 11/01/22  Knee flexion 80 115 87 seated 96 104 109  Knee extension -10 0 -6 supine -5 -3 supported -8 LAQ -1 supported  Ankle dorsiflexion         Passive ROM Right eval Left   eval  Knee flexion 90 115  Knee extension -5 0  Ankle dorsiflexion    (Blank rows = not tested)  LOWER EXTREMITY MMT:  MMT Right eval Left   eval Right 10/27/22  Hip flexion 5 5   Hip extension 5 5   Hip abduction 5 5   Hip adduction 5 5   Knee flexion 3 5 4   Knee extension 2+ (8 deg quad lag 5 4-  Ankle dorsiflexion 5 5   Ankle plantarflexion      (Blank rows = not tested)  Edema   FUNCTIONAL TESTS:  5 times sit to stand: 16.75 seconds with bil UE assist and R leg forward.     GAIT: Distance walked: 57' Assistive device utilized: Environmental consultant - 2  wheeled Level of assistance: Modified independence Gait pattern: step through pattern Comments: decreased gait speed 0.5 m/s   TODAY'S TREATMENT:  11/08/2022  THERAPEUTIC EXERCISE: to improve flexibility, strength and mobility.  Demonstration, verbal and tactile cues throughout for technique. Rec bike (seat #8) L4 x 6 min Standing slider lunges:  - lateral 2x10 -fwd 2x10 -single leg squats 2x10 Wall squats with ball squeeze 2x10- 5 sec hold Lateral step downs 2x10 6' Fwd step ups 8' 2x10 STM to R medial hamstrings MODALITIES: Game Ready vasopneumatic compression post session to R knee x 10 min, medium compression, 34 to reduce post-exercise pain and swelling/edema 11/03/2022  THERAPEUTIC EXERCISE: to improve flexibility, strength and mobility.  Demonstration, verbal and tactile cues throughout for technique. Rec bike (seat #8) L2 x 6 min 600' gait with no AD, no visible deviations Slider single leg squat RLE 2x10 Side slider lunge RLE 2x10 Conventional lunge with slider 2x10 Wall squat with VMO ball squeeze 2x10 Knee extension 10# 2x10 B con/RLE ecc R knee AROM: 1-111 deg   11/01/2022  THERAPEUTIC EXERCISE: to improve flexibility, strength and mobility.  Demonstration, verbal and tactile cues throughout for technique. Rec bike (seat #8) L2 x 6 min R gastroc stretch 2 x 60" R seated HS stretch 2 x 30-60" R fwd 6" step-up + blue TB TKE 2 x 10 R lateral eccentric step-down from 6" step with light L heel touch 2 x 10 - cues to maintain level pelvis rather than dropping at hip R fwd eccentric step-down from 6" step with light L heel touch x 10 - cues to maintain level pelvis rather than dropping at hip TRX triple extension squat 2 x 10 Prone hang x 4 min - 2 min w/o added weight, 2 x 1 min with 3# cuff weight at ankle - PT using FR on HS while in prone hang Supine R quad set + leg lengthener with heel on FR 10 x 3"  MANUAL THERAPY: To promote normalized muscle tension, improved  flexibility, improved joint mobility, and increased ROM. IASTM with FR to HS in prone hang R knee grade III-IV femorotibial mobs for R knee extension in supine   10/27/2022  THERAPEUTIC EXERCISE: to improve flexibility, strength and mobility.  Demonstration, verbal and tactile cues throughout for technique. Rec bike (seat #8) L2x43min R knee MMT Squats 2x10- with mirror for visual feedback Wall squats with ball squeeze for VMO 5x5" Knee extension 10# 2x10 BLE Knee flexion 20# 2x10 BLE Fwd step down 4' step 2x10  Prone hang - knee extension measured in this positoin Standing R TKE blue TB 10x5" 2 sets  MANUAL THERAPY: To promote normalized muscle tension, improved flexibility, improved joint mobility, increased ROM, and reduced pain.  R patellar mobs - all directions R knee grade III-IV femorotibial mobs for R knee extension  MODALITIES: Game Ready vasopneumatic compression post session to R knee x 10 min, medium compression, 34 to reduce post-exercise pain and swelling/edema   10/25/2022  THERAPEUTIC EXERCISE: to improve flexibility, strength and mobility.  Demonstration, verbal and tactile cues throughout for technique. Rec bike (seat #9) - full revolutions x 3', L2 x 3' R blue TB TKE 10 x 5", 2 sets - cues for quad activation w/o moving hips R fwd 6"  step-up + blue TB TKE 2 x 10 R lateral eccentric step-down from 6" step with light L heel touch 2 x 10 R fwd eccentric step-down from 4" step with light L heel touch 2 x 10 R/L SLS + 4-way GTB SLR x 10 each direction, intermittent 1 pole A for balance BATCA knee flexion 15# x 10 B LE; 15#  x 10 B con/R ecc BATCA knee extension 15# x 10 B LE; 10# x 10 B con/R ecc BATCA leg press 20# 2 x 10 B LE  MANUAL THERAPY: To promote improved flexibility, improved joint mobility, and increased ROM. R femorotibial A/P mobs with slight femur IR in standing runner's stretch position to promote increased R knee extension  MODALITIES: Game Ready  vasopneumatic compression post session to R knee x 10 min, medium compression, 34 to reduce post-exercise pain and swelling/edema   PATIENT EDUCATION:  Education details: progress with PT and ongoing PT POC  Person educated: Patient Education method: Explanation Education comprehension: verbalized understanding  HOME EXERCISE PROGRAM: *Access Code: [email or web address] URL: [email or web address] Date: 10/13/2022 Prepared by: Verta Ellen  Exercises - Supine Quad Set  - 2 x daily - 7 x weekly - 2 sets - 10 reps - Active Straight Leg Raise with Quad Set  - 2 x daily - 7 x weekly - 2 sets - 10 reps - Supine Heel Slide with Strap  - 2 x daily - 7 x weekly - 2 sets - 10 reps - Supine Hip Abduction  - 2 x daily - 7 x weekly - 2 sets - 10 reps - Supine Knee Extension Strengthening  - 2 x daily - 7 x weekly - 2 sets - 10 reps - Seated Long Arc Quad  - 2 x daily - 7 x weekly - 2 sets - 10 reps - Seated Long Arc Quad with Strap  - 2 x daily - 7 x weekly - 1 sets - 5-10 reps - 10 sec hold - Seated Quad Set  - 2 x daily - 7 x weekly - 2 sets - 10 reps - Seated Knee Flexion AAROM  - 2 x daily - 7 x weekly - 2 sets - 10 reps - Seated Hamstring Stretch with Strap (Mirrored)  - 2 x daily - 7 x weekly - 3 reps - 30 sec hold - [place]  - 1 x daily - 7 x weekly - 2 sets - 10 reps - 3 sec hold - Retro Step (Mirrored)  - 1 x daily - 7 x weekly - 2 sets - 10 reps - 3 sec hold - Prone Quadriceps Stretch with Strap  - 2 x daily - 7 x weekly - 2 sets - 3 reps - 30 sec hold - Gastroc Stretch on Wall  - 1 x daily - 7 x weekly - 2 sets - 3 reps - 30 sec hold - Wall Squat  - 1 x daily - 7 x weekly - 3 sets - 10 reps - Prone Knee Extension Hang  - 2 x daily - 7 x weekly - 3-5 min hold  *Pt using MedBridge GO app   ASSESSMENT:  CLINICAL IMPRESSION: Pt tolerated the progression of exercises well. Continues to show more difficulty with eccentric control on stairs with RLE. She has some tightness in her  hamstrings which could be limiting her extension ROM. GR post session to address soreness post exercise.  [place] will benefit from continued skilled PT to address ongoing ROM, strength and balance deficits to improve mobility and activity tolerance with decreased pain interference.   OBJECTIVE IMPAIRMENTS: Abnormal gait, decreased activity tolerance, decreased balance, decreased endurance, decreased mobility, difficulty walking, decreased ROM, decreased strength, increased edema, increased fascial restrictions, impaired sensation, and pain.   ACTIVITY LIMITATIONS: carrying, lifting, bending, sitting, standing, sleeping, stairs, transfers, and locomotion level  PARTICIPATION LIMITATIONS: meal prep, cleaning, laundry, driving, shopping,  and community activity  PERSONAL FACTORS: Time since onset of injury/illness/exacerbation and 1-2 comorbidities: Chronic B knee OA, s/p R TKA 09/15/22 & L TKA 04/21/22; anxiety; h/o DVT and B PE 2017; IVC filter insertion 04/12/22  are also affecting patient's functional outcome.   REHAB POTENTIAL: Excellent  CLINICAL DECISION MAKING: Stable/uncomplicated  EVALUATION COMPLEXITY: Low   GOALS: Goals reviewed with patient? Yes  SHORT TERM GOALS: Target date: 10/26/2022  Patient will be independent with initial HEP. Baseline:  Goal status: MET  10/18/22  2.  Patient will demonstrate full (0) deg R knee extension  Baseline: lacking 5 deg passively  Goal status: IN PROGRESS  10/25/22 - R knee extension lacking 3  3.  Patient will demonstrate 100 deg R knee flexion  Baseline: 90 Goal status: MET  10/25/22 - R knee flexion 104   LONG TERM GOALS: Target date: 11/16/2022  Patient will be independent with advanced/ongoing HEP to improve outcomes and carryover.  Baseline:  Goal status: IN PROGRESS - 11/01/22 - Met for current HEP  2.  Patient will report at least 75% improvement in R knee pain to improve QOL. Baseline: 4/10 at worst Goal status: MET - 10/27/22  pt denies having any pain in R knee  3.  Patient will demonstrate improved R knee AROM to >/= 0-115 deg to allow for normal gait and stair mechanics. Baseline: -10-80 Goal status: IN PROGRESS - 11/01/22 - AROM -1-109  4.  Patient will demonstrate improved R quad strength to >/= 4+/5 for improved stability and ease of mobility. Baseline: 2+/5 Goal status: IN PROGRESS  5.  Patient will be able to ambulate 600' with LRAD and normal gait pattern without increased pain to access community.  Baseline: using 2WRW Goal status: MET- 11/03/22  6. Patient will be able to ascend/descend stairs with 1 HR and reciprocal step pattern safely to access home and community.  Baseline: NT Goal status: IN PROGRESS  7.  Patient will report >40/80 on LEFS to demonstrate improved functional ability. Baseline: 25/80 Goal status: IN PROGRESS  8.  Patient will demonstrate at least 19/24 on DGI to decrease risk of falls. Baseline: NT Goal status: IN PROGRESS   PLAN:  PT FREQUENCY: 2x/week  PT DURATION: 6 weeks  PLANNED INTERVENTIONS: Therapeutic exercises, Therapeutic activity, Neuromuscular re-education, Balance training, Gait training, Patient/Family education, Self Care, Joint mobilization, Stair training, Orthotic/Fit training, Electrical stimulation, Cryotherapy, Vasopneumatic device, Manual therapy, and Re-evaluation  PLAN FOR NEXT SESSION: review and progress HEP, measure ROM weekly, modalities PRN.   Dr. Kathline Magic Knee Replacement Protocol  [ X ] Evaluate and Treat -ONLY AS INDICATED [ X ] Strengthening [ X ] Stretching [ X ] Soft Tissue Mobilization [ X ] Modalities PRN Other:   Functional Guidelines: Weight Bearing Status: WBAT  Gait: Progression from use of an assistive device to non-assisted ambulation is encouraged immediately post-operatively. Progression is per physical therapist discretion, with the primary goal of avoiding gait deviations.  Emphasis should be placed on extension  exercises  Patient should be educated to incorporate extension of the surgical knee in sleeping and sitting positions. (i.e. no pillows under knee when sleeping and leg elevated when sitting)  Increasing range of motion of the surgical knee should be the goal of physical therapy. The patient should achieve 0 degrees of knee extension and 100 degrees of knee flexion by three (3) weeks post-operative.  Golfing is allowed per patient tolerance  Biking is allowed per patient tolerance  Please call [phone] weekly  regarding ROM if less than 90 degrees  Please send progress note with patient to all follow-up visits   *PT to start 09/20/22 (PT start delayed due to intraoperative R patella fracture)   Darleene Cleaver, PTA 11/08/2022, 4:17 PM   [facility] [address]  [address] [place], [place], [address] Phone: [phone]   Fax:  [phone]

## 2022-11-11 ENCOUNTER — Ambulatory Visit: Payer: Federal, State, Local not specified - PPO

## 2022-11-11 DIAGNOSIS — R262 Difficulty in walking, not elsewhere classified: Secondary | ICD-10-CM

## 2022-11-11 DIAGNOSIS — M6281 Muscle weakness (generalized): Secondary | ICD-10-CM

## 2022-11-11 DIAGNOSIS — M25661 Stiffness of right knee, not elsewhere classified: Secondary | ICD-10-CM | POA: Diagnosis not present

## 2022-11-11 DIAGNOSIS — M25561 Pain in right knee: Secondary | ICD-10-CM

## 2022-11-11 DIAGNOSIS — R600 Localized edema: Secondary | ICD-10-CM

## 2022-11-11 NOTE — Therapy (Signed)
OUTPATIENT PHYSICAL THERAPY TREATMENT      Patient Name: Lori Klein MRN: [id number] DOB:1965/06/22, 57 y.o., female Today's Date: 11/11/2022   END OF SESSION:  PT End of Session - 11/11/22 1706     Visit Number 12    Date for PT Re-Evaluation 11/16/22    Authorization Type Federal [employer] - VL: 50 (13 used in prior PT episode)    PT Start Time 1617    PT Stop Time 1702    PT Time Calculation (min) 45 min    Activity Tolerance Patient tolerated treatment well    Behavior During Therapy WFL for tasks assessed/performed                    Past Medical History:  Diagnosis Date   Anxiety    Arthritis    Blood dyscrasia    has sickle cell trait   DVT (deep venous thrombosis) (HCC)    Headache    migraine   PONV (postoperative nausea and vomiting)    Pre-diabetes    Pulmonary embolism (HCC)    Past Surgical History:  Procedure Laterality Date   CHOLECYSTECTOMY  2008   laparoscopic   IVC FILTER INSERTION N/A 04/12/2022   Procedure: IVC FILTER INSERTION;  Surgeon: Maeola Harman, MD;  Location: [facility] INVASIVE CV LAB;  Service: Cardiovascular;  Laterality: N/A;   IVC VENOGRAPHY N/A 04/12/2022   Procedure: IVC Venography;  Surgeon: Maeola Harman, MD;  Location: [facility] INVASIVE CV LAB;  Service: Cardiovascular;  Laterality: N/A;   KNEE ARTHROPLASTY Left 04/21/2022   Procedure: COMPUTER ASSISTED TOTAL KNEE ARTHROPLASTY;  Surgeon: Samson Frederic, MD;  Location: [facility];  Service: Orthopedics;  Laterality: Left;   KNEE ARTHROPLASTY Right 09/15/2022   Procedure: COMPUTER ASSISTED RIGHT TOTAL KNEE ARTHROPLASTY;  Surgeon: Samson Frederic, MD;  Location: [facility];  Service: Orthopedics;  Laterality: Right;   ulnar surgery Left 2017   released compressed nerve   WRIST SURGERY Right    nerve de fragmentation   Patient Active Problem List   Diagnosis Date Noted   Osteoarthritis of right knee 09/15/2022   Osteoarthritis of left knee 04/21/2022    Bilateral pulmonary embolism (HCC) 09/27/2015   Acute deep vein thrombosis (DVT) of right lower extremity (HCC) 09/27/2015    PCP: Tiana Loft, PA-C   REFERRING PROVIDER: Clois Dupes, PA-C   REFERRING DIAG: [phone] (ICD-10-CM) - S/P TKR (total knee replacement)   THERAPY DIAG:  Stiffness of right knee, not elsewhere classified  Acute pain of right knee  Localized edema  Muscle weakness (generalized)  Difficulty in walking, not elsewhere classified  RATIONALE FOR EVALUATION AND TREATMENT: Rehabilitation  ONSET DATE: 09/15/2022 - R TKA with cemented patella due to intraoperative vertical patellar fracture   NEXT MD VISIT: 10/25/2022   SUBJECTIVE:  SUBJECTIVE STATEMENT: Pt reports no changes, everything going well.   EVAL: Pt had R TKA on 09/15/22.  She had patellar fracture at the end of surgery and was in knee immobilizer for 3 weeks.  She has been ok'd to start PT, but to wear immobilizer and use walker until she has better quad control.  She has already started her exercises, the ones she could remember from her previous TKA done last March and has been working on quad sets to regain her quad control, and has stopped using the knee immobilizer.  Otherwise doing well, not a lot of pain, but she has had a lot of insomnia with this knee, only sleeping 2-3 hours.  Not from pain, just really uncomfortable.    PAIN: Are you having pain? No  PERTINENT HISTORY:  Chronic B knee OA, s/p R TKA 09/15/22 & L TKA 04/21/22; anxiety; h/o DVT and B PE 2017; IVC filter insertion 04/12/22  PRECAUTIONS: Other: history DVT  RED FLAGS: None  WEIGHT BEARING RESTRICTIONS: No  FALLS:  Has patient fallen in last 6 months? 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  PATIENT SURVEYS:  LEFS 25/80  COGNITION: Overall cognitive status: Within functional limits for tasks assessed    SENSATION: Decreased sensation on medial and lateral sides of incision.   EDEMA:   Right cm Left cm  Patella (midline) 40 38.5  4 in above patella  50 48  4 in below patella 41 40  ankles 21 21    MUSCLE LENGTH: NT  POSTURE:  No Significant postural limitations  PALPATION: No tenderness in calf.  Incision healing well, no tenderness or redness.    LOWER EXTREMITY ROM: L knee post-rehab following L TKA 0-120 (05/24/22)  Active ROM Right eval Left   eval Right 10/13/22 R 10/20/22 R 10/25/22 R 11/01/22 R 11/11/22  Knee flexion 80 115 87 seated 96 104 109 112  Knee extension -10 0 -6 supine -5 -3 supported -8 LAQ -1 supported 0  Ankle dorsiflexion          Passive ROM Right eval Left   eval  Knee flexion 90 115  Knee extension -5 0  Ankle dorsiflexion    (Blank rows = not tested)  LOWER EXTREMITY MMT:  MMT Right eval Left   eval Right 10/27/22  Hip flexion 5 5   Hip extension 5 5   Hip abduction 5 5   Hip adduction 5 5   Knee flexion 3 5 4   Knee extension 2+ (8 deg quad lag 5 4-  Ankle dorsiflexion 5 5   Ankle plantarflexion      (Blank rows = not tested)  Edema   FUNCTIONAL TESTS:  5 times sit to stand: 16.75 seconds with bil UE assist and R leg forward.     GAIT: Distance walked: 10' Assistive device  utilized: Environmental consultant - 2 wheeled Level of assistance: Modified independence Gait pattern: step through pattern Comments: decreased gait speed 0.5  m/s   TODAY'S TREATMENT:  11/11/2022  THERAPEUTIC EXERCISE: to improve flexibility, strength and mobility.  Demonstration, verbal and tactile cues throughout for technique. Standing R lunge on 9' stool x 20; Lateral R lunge x 20  leg press 25# 3x10 BLE Knee flexion 15# R LE 2x10 Knee extension 5# RLE 2x10 R single leg deadlift 2x10 with 10# weight   MANUAL THERAPY: To promote normalized muscle tension, improved flexibility, improved joint mobility, and increased ROM. STM to R hamstrings at mid thigh and distally  11/08/2022  THERAPEUTIC EXERCISE: to improve flexibility, strength and mobility.  Demonstration, verbal and tactile cues throughout for technique. Rec bike (seat #8) L4 x 6 min Standing slider lunges:  - lateral 2x10 -fwd 2x10 -single leg squats 2x10 Wall squats with ball squeeze 2x10- 5 sec hold Lateral step downs 2x10 6' Fwd step ups 8' 2x10 STM to R medial hamstrings MODALITIES: Game Ready vasopneumatic compression post session to R knee x 10 min, medium compression, 34 to reduce post-exercise pain and swelling/edema 11/03/2022  THERAPEUTIC EXERCISE: to improve flexibility, strength and mobility.  Demonstration, verbal and tactile cues throughout for technique. Rec bike (seat #8) L2 x 6 min 600' gait with no AD, no visible deviations Slider single leg squat RLE 2x10 Side slider lunge RLE 2x10 Conventional lunge with slider 2x10 Wall squat with VMO ball squeeze 2x10 Knee extension 10# 2x10 B con/RLE ecc R knee AROM: 1-111 deg   11/01/2022  THERAPEUTIC EXERCISE: to improve flexibility, strength and mobility.  Demonstration, verbal and tactile cues throughout for technique. Rec bike (seat #8) L2 x 6 min R gastroc stretch 2 x 60" R seated HS stretch 2 x 30-60" R fwd 6" step-up + blue TB TKE 2 x 10 R lateral eccentric  step-down from 6" step with light L heel touch 2 x 10 - cues to maintain level pelvis rather than dropping at hip R fwd eccentric step-down from 6" step with light L heel touch x 10 - cues to maintain level pelvis rather than dropping at hip TRX triple extension squat 2 x 10 Prone hang x 4 min - 2 min w/o added weight, 2 x 1 min with 3# cuff weight at ankle - PT using FR on HS while in prone hang Supine R quad set + leg lengthener with heel on FR 10 x 3"  MANUAL THERAPY: To promote normalized muscle tension, improved flexibility, improved joint mobility, and increased ROM. IASTM with FR to HS in prone hang R knee grade III-IV femorotibial mobs for R knee extension in supine   10/27/2022  THERAPEUTIC EXERCISE: to improve flexibility, strength and mobility.  Demonstration, verbal and tactile cues throughout for technique. Rec bike (seat #8) L2x66min R knee MMT Squats 2x10- with mirror for visual feedback Wall squats with ball squeeze for VMO 5x5" Knee extension 10# 2x10 BLE Knee flexion 20# 2x10 BLE Fwd step down 4' step 2x10  Prone hang - knee extension measured in this positoin Standing R TKE blue TB 10x5" 2 sets  MANUAL THERAPY: To promote normalized muscle tension, improved flexibility, improved joint mobility, increased ROM, and reduced pain.  R patellar mobs - all directions R knee grade III-IV femorotibial mobs for R knee extension  MODALITIES: Game Ready vasopneumatic compression post session to R knee x 10 min, medium compression, 34 to reduce post-exercise pain and swelling/edema   10/25/2022  THERAPEUTIC EXERCISE: to improve flexibility, strength and mobility.  Demonstration, verbal and tactile cues throughout for technique. Rec bike (seat #9) -  full revolutions x 3', L2 x 3' R blue TB TKE 10 x 5", 2 sets - cues for quad activation w/o moving hips R fwd 6"  step-up + blue TB TKE 2 x 10 R lateral eccentric step-down from 6" step with light L heel touch 2 x 10 R fwd  eccentric step-down from 4" step with light L heel touch 2 x 10 R/L SLS + 4-way GTB SLR x 10 each direction, intermittent 1 pole A for balance BATCA knee flexion 15# x 10 B LE; 15# x 10 B con/R ecc BATCA knee extension 15# x 10 B LE; 10# x 10 B con/R ecc BATCA leg press 20# 2 x 10 B LE  MANUAL THERAPY: To promote improved flexibility, improved joint mobility, and increased ROM. R femorotibial A/P mobs with slight femur IR in standing runner's stretch position to promote increased R knee extension  MODALITIES: Game Ready vasopneumatic compression post session to R knee x 10 min, medium compression, 34 to reduce post-exercise pain and swelling/edema   PATIENT EDUCATION:  Education details: progress with PT and ongoing PT POC  Person educated: Patient Education method: Explanation Education comprehension: verbalized understanding  HOME EXERCISE PROGRAM: *Access Code: [email or web address] URL: [email or web address] Date: 11/11/2022 Prepared by: Verta Ellen  Exercises - Supine Quad Set  - 2 x daily - 7 x weekly - 2 sets - 10 reps - Active Straight Leg Raise with Quad Set  - 2 x daily - 7 x weekly - 2 sets - 10 reps - Supine Heel Slide with Strap  - 2 x daily - 7 x weekly - 2 sets - 10 reps - Seated Long Arc Quad with Strap  - 2 x daily - 7 x weekly - 1 sets - 5-10 reps - 10 sec hold - Seated Knee Flexion AAROM  - 2 x daily - 7 x weekly - 2 sets - 10 reps - Seated Hamstring Stretch with Strap (Mirrored)  - 2 x daily - 7 x weekly - 3 reps - 30 sec hold - Prone Quadriceps Stretch with Strap  - 2 x daily - 7 x weekly - 2 sets - 3 reps - 30 sec hold - Gastroc Stretch on Wall  - 1 x daily - 7 x weekly - 2 sets - 3 reps - 30 sec hold - Wall Squat  - 1 x daily - 7 x weekly - 3 sets - 10 reps - Lateral Step Down  - 1 x daily - 7 x weekly - 2 sets - 10 reps - Prone Knee Extension Hang  - 2 x daily - 7 x weekly - 3-5 min hold  *Pt using MedBridge GO app   ASSESSMENT:  CLINICAL  IMPRESSION: Pt continues to show progress toward goals. Progressed strengthening to tolerance for LE. She still has muscle tension in the R hamstrings with TTP by the end of MT tension and tenderness decreased. Education on using heat to hamstrings. ROM improved today (0-112) pt may be ready for D/C coming up. Lori Klein will benefit from continued skilled PT to address ongoing ROM, strength and balance deficits to improve mobility and activity tolerance with decreased pain interference.   OBJECTIVE IMPAIRMENTS: Abnormal gait, decreased activity tolerance, decreased balance, decreased endurance, decreased mobility, difficulty walking, decreased ROM, decreased strength, increased edema, increased fascial restrictions, impaired sensation, and pain.   ACTIVITY LIMITATIONS: carrying, lifting, bending, sitting, standing, sleeping, stairs, transfers, and locomotion level  PARTICIPATION LIMITATIONS: meal prep, cleaning, laundry, driving, shopping, and community activity  PERSONAL FACTORS: Time since onset of injury/illness/exacerbation and 1-2 comorbidities: Chronic B knee OA, s/p R TKA 09/15/22 & L TKA 04/21/22; anxiety; h/o DVT and B PE 2017; IVC filter insertion 04/12/22  are also affecting patient's functional outcome.   REHAB POTENTIAL: Excellent  CLINICAL DECISION MAKING: Stable/uncomplicated  EVALUATION COMPLEXITY: Low   GOALS: Goals reviewed with patient? Yes  SHORT TERM GOALS: Target date: 10/26/2022  Patient will be independent with initial HEP. Baseline:  Goal status: MET  10/18/22  2.  Patient will demonstrate full (0) deg R knee extension  Baseline: lacking 5 deg passively  Goal status: IN PROGRESS  10/25/22 - R knee extension lacking 3  3.  Patient will demonstrate 100 deg R knee flexion  Baseline: 90 Goal status: MET  10/25/22 - R knee flexion 104   LONG TERM GOALS: Target date: 11/16/2022  Patient will be independent with advanced/ongoing HEP to improve outcomes and carryover.   Baseline:  Goal status: IN PROGRESS - 11/01/22 - Met for current HEP  2.  Patient will report at least 75% improvement in R knee pain to improve QOL. Baseline: 4/10 at worst Goal status: MET - 10/27/22 pt denies having any pain in R knee  3.  Patient will demonstrate improved R knee AROM to >/= 0-115 deg to allow for normal gait and stair mechanics. Baseline: -10-80 Goal status: IN PROGRESS - 11/01/22 - AROM -1-109  4.  Patient will demonstrate improved R quad strength to >/= 4+/5 for improved stability and ease of mobility. Baseline: 2+/5 Goal status: IN PROGRESS  5.  Patient will be able to ambulate 600' with LRAD and normal gait pattern without increased pain to access community.  Baseline: using 2WRW Goal status: MET- 11/03/22  6. Patient will be able to ascend/descend stairs with 1 HR and reciprocal step pattern safely to access home and community.  Baseline: NT Goal status: IN PROGRESS  7.  Patient will report >40/80 on LEFS to demonstrate improved functional ability. Baseline: 25/80 Goal status: IN PROGRESS  8.  Patient will demonstrate at least 19/24 on DGI to decrease risk of falls. Baseline: NT Goal status: IN PROGRESS   PLAN:  PT FREQUENCY: 2x/week  PT DURATION: 6 weeks  PLANNED INTERVENTIONS: Therapeutic exercises, Therapeutic activity, Neuromuscular re-education, Balance training, Gait training, Patient/Family education, Self Care, Joint mobilization, Stair training, Orthotic/Fit training, Electrical stimulation, Cryotherapy, Vasopneumatic device, Manual therapy, and Re-evaluation  PLAN FOR NEXT SESSION: review and progress HEP, measure ROM weekly, modalities PRN.   Dr. Kathline Magic Knee Replacement Protocol  [ X ] Evaluate and Treat -ONLY AS INDICATED [ X ] Strengthening [ X ] Stretching [ X ] Soft Tissue Mobilization [ X ] Modalities PRN Other:   Functional Guidelines: Weight Bearing Status: WBAT  Gait: Progression from use of an assistive device to  non-assisted ambulation is encouraged immediately post-operatively. Progression is per physical therapist discretion, with the primary goal of avoiding gait deviations.  Emphasis should be placed on extension exercises  Patient should be educated to incorporate extension of the surgical knee in sleeping and sitting positions. (i.e. no pillows under knee when sleeping and leg elevated when sitting)  Increasing range of motion of the surgical knee should be the goal of physical therapy. The patient should achieve 0 degrees of knee extension and 100 degrees of knee flexion by three (3) weeks post-operative.  Golfing is allowed per patient tolerance  Biking is allowed per patient tolerance  Please call [phone] weekly regarding ROM if less  than 90 degrees  Please send progress note with patient to all follow-up visits   *PT to start 09/20/22 (PT start delayed due to intraoperative R patella fracture)   Darleene Cleaver, PTA 11/11/2022, 5:06 PM   [facility] [address]  [address] [place], [place], [address] Phone: [phone]   Fax:  [phone]

## 2022-11-15 ENCOUNTER — Encounter: Payer: Self-pay | Admitting: Physical Therapy

## 2022-11-15 ENCOUNTER — Ambulatory Visit: Payer: Federal, State, Local not specified - PPO | Admitting: Physical Therapy

## 2022-11-15 DIAGNOSIS — M25661 Stiffness of right knee, not elsewhere classified: Secondary | ICD-10-CM

## 2022-11-15 DIAGNOSIS — R262 Difficulty in walking, not elsewhere classified: Secondary | ICD-10-CM

## 2022-11-15 DIAGNOSIS — M6281 Muscle weakness (generalized): Secondary | ICD-10-CM

## 2022-11-15 DIAGNOSIS — R600 Localized edema: Secondary | ICD-10-CM

## 2022-11-15 DIAGNOSIS — M25561 Pain in right knee: Secondary | ICD-10-CM

## 2022-11-15 NOTE — Therapy (Signed)
OUTPATIENT PHYSICAL THERAPY TREATMENT / DISCHARGE SUMMARY   Patient Name: Lori Klein MRN: [id number] DOB:02/10/1965, 57 y.o., female Today's Date: 11/15/2022   END OF SESSION:  PT End of Session - 11/15/22 1401     Visit Number 13    Date for PT Re-Evaluation 11/16/22    Authorization Type Federal [employer] - VL: 50 (13 used in prior PT episode)    PT Start Time 1401    PT Stop Time 1425    PT Time Calculation (min) 24 min    Activity Tolerance Patient tolerated treatment well    Behavior During Therapy WFL for tasks assessed/performed                Past Medical History:  Diagnosis Date   Anxiety    Arthritis    Blood dyscrasia    has sickle cell trait   DVT (deep venous thrombosis) (HCC)    Headache    migraine   PONV (postoperative nausea and vomiting)    Pre-diabetes    Pulmonary embolism (HCC)    Past Surgical History:  Procedure Laterality Date   CHOLECYSTECTOMY  2008   laparoscopic   IVC FILTER INSERTION N/A 04/12/2022   Procedure: IVC FILTER INSERTION;  Surgeon: Maeola Harman, MD;  Location: [facility] INVASIVE CV LAB;  Service: Cardiovascular;  Laterality: N/A;   IVC VENOGRAPHY N/A 04/12/2022   Procedure: IVC Venography;  Surgeon: Maeola Harman, MD;  Location: [facility] INVASIVE CV LAB;  Service: Cardiovascular;  Laterality: N/A;   KNEE ARTHROPLASTY Left 04/21/2022   Procedure: COMPUTER ASSISTED TOTAL KNEE ARTHROPLASTY;  Surgeon: Samson Frederic, MD;  Location: [facility] ORS;  Service: Orthopedics;  Laterality: Left;   KNEE ARTHROPLASTY Right 09/15/2022   Procedure: COMPUTER ASSISTED RIGHT TOTAL KNEE ARTHROPLASTY;  Surgeon: Samson Frederic, MD;  Location: [facility];  Service: Orthopedics;  Laterality: Right;   ulnar surgery Left 2017   released compressed nerve   WRIST SURGERY Right    nerve de fragmentation   Patient Active Problem List   Diagnosis Date Noted   Osteoarthritis of right knee 09/15/2022   Osteoarthritis of left knee 04/21/2022    Bilateral pulmonary embolism (HCC) 09/27/2015   Acute deep vein thrombosis (DVT) of right lower extremity (HCC) 09/27/2015    PCP: Tiana Loft, PA-C   REFERRING PROVIDER: Clois Dupes, PA-C   REFERRING DIAG: [phone] (ICD-10-CM) - S/P TKR (total knee replacement)   THERAPY DIAG:  Stiffness of right knee, not elsewhere classified  Acute pain of right knee  Localized edema  Muscle weakness (generalized)  Difficulty in walking, not elsewhere classified  RATIONALE FOR EVALUATION AND TREATMENT: Rehabilitation  ONSET DATE: 09/15/2022 - R TKA with cemented patella due to intraoperative vertical patellar fracture   NEXT MD VISIT: 6 month f/u   SUBJECTIVE:  SUBJECTIVE STATEMENT: Pt reports no changes, everything going well.   EVAL: Pt had R TKA on 09/15/22.  She had patellar fracture at the end of surgery and was in knee immobilizer for 3 weeks.  She has been ok'd to start PT, but to wear immobilizer and use walker until she has better quad control.  She has already started her exercises, the ones she could remember from her previous TKA done last March and has been working on quad sets to regain her quad control, and has stopped using the knee immobilizer.  Otherwise doing well, not a lot of pain, but she has had a lot of insomnia with this knee, only sleeping 2-3 hours.  Not from pain, just really uncomfortable.    PAIN: Are you having pain? No  PERTINENT HISTORY:  Chronic B knee OA, s/p R TKA 09/15/22 & L TKA 04/21/22; anxiety; h/o DVT and B PE 2017; IVC filter insertion 04/12/22  PRECAUTIONS: Other: history DVT  RED FLAGS: None  WEIGHT BEARING RESTRICTIONS: No  FALLS:  Has patient fallen in last 6 months? 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  PATIENT SURVEYS:  LEFS 25/80  COGNITION: Overall cognitive status: Within functional limits for tasks assessed    SENSATION: Decreased sensation on medial and lateral sides of incision.   EDEMA:   Right cm Left cm  Patella (midline) 40 38.5  4 in above patella  50 48  4 in below patella 41 40  ankles 21 21    MUSCLE LENGTH: NT  POSTURE:  No Significant postural limitations  PALPATION: No tenderness in calf.  Incision healing well, no tenderness or redness.    LOWER EXTREMITY ROM: L knee post-rehab following L TKA 0-120 (05/24/22)  Active ROM Right eval Left   eval Right 10/13/22 R 10/20/22 R 10/25/22 R 11/01/22 R 11/11/22 R 11/15/22  Knee flexion 80 115 87 seated 96 104 109 112 115  Knee extension -10 0 -6 supine -5 -3 supported -8 LAQ -1 supported 0 0   Passive ROM Right eval Left   eval  Knee flexion 90 115  Knee extension -5 0  Ankle dorsiflexion    (Blank rows = not tested)  LOWER EXTREMITY MMT:  MMT Right eval Left   eval Right 10/27/22 Right 11/15/22 Left 11/15/22  Hip flexion 5 5  5 5   Hip extension 5 5  4+ 5  Hip abduction 5 5  5 5   Hip adduction 5 5  5 5   Knee flexion 3 5 4 5 5   Knee extension 2+ (8 deg quad lag 5 4- 5 5  Ankle dorsiflexion 5 5  5 5   Ankle plantarflexion        (Blank rows = not tested)  Edema   FUNCTIONAL TESTS:  5 times sit to stand: 16.75 seconds with bil UE assist and R leg forward.      GAIT: Distance walked: 21' Assistive device utilized: Environmental consultant - 2 wheeled Level  of assistance: Modified independence Gait pattern: step through pattern Comments: decreased gait speed 0.5 m/s   TODAY'S TREATMENT:   11/15/2022  THERAPEUTIC EXERCISE: to improve flexibility, strength and mobility.  Demonstration, verbal and tactile cues throughout for technique. Rec bike (seat #8) L4 x 6 min  THERAPEUTIC ACTIVITIES: LEFS: 75/80 DGI = 23/24 R knee AROM assessment LE MMT Goal assessment   11/11/2022  THERAPEUTIC EXERCISE: to improve flexibility, strength and mobility.  Demonstration, verbal and tactile cues throughout for technique. Standing R lunge on 9' stool x 20; Lateral R lunge x 20  leg press 25# 3x10 BLE Knee flexion 15# R LE 2x10 Knee extension 5# RLE 2x10 R single leg deadlift 2x10 with 10# weight   MANUAL THERAPY: To promote normalized muscle tension, improved flexibility, improved joint mobility, and increased ROM. STM to R hamstrings at mid thigh and distally   11/08/2022  THERAPEUTIC EXERCISE: to improve flexibility, strength and mobility.  Demonstration, verbal and tactile cues throughout for technique. Rec bike (seat #8) L4 x 6 min Standing slider lunges:  - lateral 2x10 -fwd 2x10 -single leg squats 2x10 Wall squats with ball squeeze 2x10- 5 sec hold Lateral step downs 2x10 6' Fwd step ups 8' 2x10 STM to R medial hamstrings MODALITIES: Game Ready vasopneumatic compression post session to R knee x 10 min, medium compression, 34 to reduce post-exercise pain and swelling/edema   PATIENT EDUCATION:  Education details: recommended frequency for ongoing HEP at discharge to prevent loss of gains achieved with PT  Person educated: Patient Education method: Explanation Education comprehension: verbalized understanding  HOME EXERCISE PROGRAM: *Access Code: [email or web address] URL: [email or web address] Date: 11/11/2022 Prepared by: Verta Ellen  Exercises - Supine Quad Set  - 2 x daily - 7 x weekly - 2 sets - 10 reps - Active Straight Leg Raise with Quad Set  - 2 x daily - 7 x weekly - 2 sets - 10 reps - Supine Heel Slide with Strap  - 2 x daily - 7 x weekly - 2 sets - 10 reps - Seated Long Arc Quad with Strap  - 2 x daily - 7 x weekly - 1 sets - 5-10 reps - 10 sec hold - Seated Knee Flexion AAROM  - 2 x daily - 7 x weekly - 2 sets - 10 reps - Seated Hamstring Stretch with Strap (Mirrored)  - 2 x daily - 7 x weekly - 3 reps - 30 sec hold - Prone Quadriceps Stretch with Strap  - 2 x daily - 7 x weekly - 2 sets - 3 reps - 30 sec hold - Gastroc Stretch on Wall  - 1 x daily - 7 x weekly - 2 sets - 3 reps - 30 sec hold - Wall Squat  - 1 x daily - 7 x weekly - 3 sets - 10 reps - Lateral Step Down  - 1 x daily - 7 x weekly - 2 sets - 10 reps - Prone Knee Extension Hang  - 2 x daily - 7 x weekly - 3-5 min hold  *Pt using MedBridge GO app   ASSESSMENT:  CLINICAL IMPRESSION: Lori Klein reports her R knee remains pain free and she has been able to resume most daily activities w/o limitation due to R knee. She demonstrates a normal gait pattern including reciprocal stair ascent/descent w/o any AD. No concerns noted related to balance with DGI score 23/24. R knee AROM now 0-115 and overall B LE strength now grossly 5/5. LEFS has improved from  25/80 to 75/80. All PT goals now met and Lori Klein ready to transition to her HEP, therefore will proceed with discharge from physical therapy for this episode.  OBJECTIVE IMPAIRMENTS: Abnormal gait, decreased activity tolerance, decreased balance, decreased endurance, decreased mobility, difficulty walking, decreased ROM, decreased strength, increased edema, increased fascial restrictions, impaired sensation, and pain.   ACTIVITY LIMITATIONS: carrying, lifting, bending, sitting, standing, sleeping, stairs, transfers, and locomotion level  PARTICIPATION LIMITATIONS: meal prep, cleaning,  laundry, driving, shopping, and community activity  PERSONAL FACTORS: Time since onset of injury/illness/exacerbation and 1-2 comorbidities: Chronic B knee OA, s/p R TKA 09/15/22 & L TKA 04/21/22; anxiety; h/o DVT and B PE 2017; IVC filter insertion 04/12/22  are also affecting patient's functional outcome.   REHAB POTENTIAL: Excellent  CLINICAL DECISION MAKING: Stable/uncomplicated  EVALUATION COMPLEXITY: Low   GOALS: Goals reviewed with patient? Yes  SHORT TERM GOALS: Target date: 10/26/2022  Patient will be independent with initial HEP. Baseline:  Goal status: MET - 10/18/22  2.  Patient will demonstrate full (0) deg R knee extension  Baseline: lacking 5 deg passively  Goal status: MET - 11/11/22   3.  Patient will demonstrate 100 deg R knee flexion  Baseline: 90 Goal status: MET - 10/25/22 - R knee flexion 104   LONG TERM GOALS: Target date: 11/16/2022  Patient will be independent with advanced/ongoing HEP to improve outcomes and carryover.  Baseline:  Goal status: MET - 11/15/22   2.  Patient will report at least 75% improvement in R knee pain to improve QOL. Baseline: 4/10 at worst Goal status: MET - 10/27/22 pt denies having any pain in R knee  3.  Patient will demonstrate improved R knee AROM to >/= 0-115 deg to allow for normal gait and stair mechanics. Baseline: -10-80 Goal status: MET - 11/15/22 R knee AROM 0-115  4.  Patient will demonstrate improved R quad strength to >/= 4+/5 for improved stability and ease of mobility. Baseline: 2+/5 Goal status: MET - 11/15/22  5.  Patient will be able to ambulate 600' with LRAD and normal gait pattern without increased pain to access community.  Baseline: using 2WRW Goal status: MET - 11/03/22  6. Patient will be able to ascend/descend stairs with 1 HR and reciprocal step pattern safely to access home and community.  Baseline: NT Goal status: MET - 11/15/22  7.  Patient will report >40/80 on LEFS to demonstrate  improved functional ability. Baseline: 25/80 Goal status: MET - 11/15/22 - 75/80  8.  Patient will demonstrate at least 19/24 on DGI to decrease risk of falls. Baseline: NT Goal status: MET - 11/15/22 - 23/24  PLAN:  PT FREQUENCY: 2x/week  PT DURATION: 6 weeks  PLANNED INTERVENTIONS: Therapeutic exercises, Therapeutic activity, Neuromuscular re-education, Balance training, Gait training, Patient/Family education, Self Care, Joint mobilization, Stair training, Orthotic/Fit training, Electrical stimulation, Cryotherapy, Vasopneumatic device, Manual therapy, and Re-evaluation  PLAN FOR NEXT SESSION: Transition to HEP + D/C from PT    PHYSICAL THERAPY DISCHARGE SUMMARY  Visits from Start of Care: 13  Current functional level related to goals / functional outcomes: Refer to above clinical impression and goal assessment.   Remaining deficits: None.   Education / Equipment: HEP   Patient agrees to discharge. Patient goals were met. Patient is being discharged due to meeting the stated rehab goals.  Marry Guan, PT 11/15/2022, 2:25 PM   [facility] [address]  [address] [place], [place],  [address] Phone: [phone]   Fax:  [phone]

## 2022-11-16 ENCOUNTER — Encounter: Payer: Federal, State, Local not specified - PPO | Admitting: Physical Therapy

## 2022-12-08 ENCOUNTER — Encounter: Payer: Self-pay | Admitting: Vascular Surgery

## 2022-12-08 ENCOUNTER — Ambulatory Visit (INDEPENDENT_AMBULATORY_CARE_PROVIDER_SITE_OTHER): Payer: Federal, State, Local not specified - PPO | Admitting: Vascular Surgery

## 2022-12-08 VITALS — BP 125/81 | HR 77 | Temp 97.9°F | Ht 67.0 in | Wt 172.0 lb

## 2022-12-08 DIAGNOSIS — Z86718 Personal history of other venous thrombosis and embolism: Secondary | ICD-10-CM | POA: Diagnosis not present

## 2022-12-08 NOTE — H&P (View-Only) (Signed)
 Patient ID: Lori Klein, female   DOB: 1965/10/12, 57 y.o.   MRN: [id number]  Reason for Consult: Follow-up   Referred by Tiana Loft, PA-C  Subjective:     HPI:  Lori Klein is a 57 y.o. female has a history of DVT and PE no longer maintained on anticoagulation.  Recently underwent bilateral total knee arthroplasty on several occasions had a filter placed prior.  She has now recovered well with no plans for future orthopedic procedures.  Past Medical History:  Diagnosis Date   Anxiety    Arthritis    Blood dyscrasia    has sickle cell trait   DVT (deep venous thrombosis) (HCC)    Headache    migraine   PONV (postoperative nausea and vomiting)    Pre-diabetes    Pulmonary embolism (HCC)    Family History  Problem Relation Age of Onset   Hypertension Mother    Hypertension Father    Diabetes Other    Past Surgical History:  Procedure Laterality Date   CHOLECYSTECTOMY  2008   laparoscopic   IVC FILTER INSERTION N/A 04/12/2022   Procedure: IVC FILTER INSERTION;  Surgeon: Maeola Harman, MD;  Location: [facility] INVASIVE CV LAB;  Service: Cardiovascular;  Laterality: N/A;   IVC VENOGRAPHY N/A 04/12/2022   Procedure: IVC Venography;  Surgeon: Maeola Harman, MD;  Location: [facility] INVASIVE CV LAB;  Service: Cardiovascular;  Laterality: N/A;   KNEE ARTHROPLASTY Left 04/21/2022   Procedure: COMPUTER ASSISTED TOTAL KNEE ARTHROPLASTY;  Surgeon: Samson Frederic, MD;  Location: [facility];  Service: Orthopedics;  Laterality: Left;   KNEE ARTHROPLASTY Right 09/15/2022   Procedure: COMPUTER ASSISTED RIGHT TOTAL KNEE ARTHROPLASTY;  Surgeon: Samson Frederic, MD;  Location: [facility];  Service: Orthopedics;  Laterality: Right;   ulnar surgery Left 2017   released compressed nerve   WRIST SURGERY Right    nerve de fragmentation    Short Social History:  Social History   Tobacco Use   Smoking status: Never   Smokeless tobacco: Never  Substance Use Topics    Alcohol use: Yes    Comment: occasionally    No Known Allergies  Current Outpatient Medications  Medication Sig Dispense Refill   atorvastatin (LIPITOR) 40 MG tablet Take 40 mg by mouth daily.     BIOTIN PO Take 1 capsule by mouth daily.     busPIRone (BUSPAR) 10 MG tablet Take 20 mg by mouth 2 (two) times daily.     Cyanocobalamin (B-12 PO) Take 1 capsule by mouth daily.     diclofenac (VOLTAREN) 75 MG EC tablet Take 75 mg by mouth 2 (two) times daily.     escitalopram (LEXAPRO) 20 MG tablet Take 20 mg by mouth daily.     Naphazoline-Pheniramine (VISINE OP) Place 1 drop into both eyes daily as needed (irritation).     ondansetron (ZOFRAN) 4 MG tablet Take 1 tablet (4 mg total) by mouth every 8 (eight) hours as needed for nausea or vomiting. 30 tablet 0   Scar Treatment Products (MEDERMA) GEL Apply 1 Application topically daily. Apply to scar on knee     SUMAtriptan (IMITREX) 100 MG tablet Take 100 mg by mouth every 2 (two) hours as needed for migraine.     VITAMIN E PO Take 1 capsule by mouth daily.     amoxicillin (AMOXIL) 500 MG tablet Take 4 tablets (2000 mg) by mouth 1 hour prior to dental appointments. (Patient not taking: Reported on 12/08/2022)  apixaban (ELIQUIS) 2.5 MG TABS tablet Take 1 tablet (2.5 mg total) by mouth 2 (two) times daily. 60 tablet 0   oxyCODONE (OXY IR/ROXICODONE) 5 MG immediate release tablet Take 1 tablet every 4 hours by mouth as needed for 7 days, for pain. (Patient not taking: Reported on 12/08/2022) 42 tablet 0   No current facility-administered medications for this visit.    Review of Systems  Constitutional:  Constitutional negative. HENT: HENT negative.  Eyes: Eyes negative.  Respiratory: Respiratory negative.  Cardiovascular: Cardiovascular negative.  GI: Gastrointestinal negative.  Musculoskeletal: Musculoskeletal negative.  Skin: Skin negative.  Neurological: Neurological negative. Hematologic: Hematologic/lymphatic negative.  Psychiatric:  Psychiatric negative.        Objective:  Objective   Vitals:   12/08/22 1556  BP: 125/81  Pulse: 77  Temp: 97.9 F (36.6 C)  SpO2: 98%     Physical Exam HENT:     Head: Normocephalic.     Nose: Nose normal.  Eyes:     Pupils: Pupils are equal, round, and reactive to light.  Cardiovascular:     Rate and Rhythm: Normal rate.     Pulses: Normal pulses.  Pulmonary:     Effort: Pulmonary effort is normal.  Abdominal:     General: Abdomen is flat.  Musculoskeletal:        General: Normal range of motion.     Cervical back: Normal range of motion and neck supple.     Right lower leg: No edema.     Left lower leg: No edema.  Skin:    General: Skin is warm.     Capillary Refill: Capillary refill takes less than 2 seconds.  Neurological:     General: No focal deficit present.     Mental Status: She is alert.  Psychiatric:        Mood and Affect: Mood normal.     Data: No studies     Assessment/Plan:    57 year old female status post IVC filter placement for history of DVT with PE and bilateral total knee arthroplasty.  She has now recovered well.  Plan will be for IVC filter retrieval.  We discussed the risk and benefits including the risk of additional procedures needed to retrieve the filter including extended time and possible risk of IVC perforation with bleeding versus inability to retrieve she demonstrates good understanding.     Maeola Harman MD [facility]

## 2022-12-08 NOTE — Progress Notes (Signed)
Patient ID: Lori Klein, female   DOB: 1965/10/12, 57 y.o.   MRN: [id number]  Reason for Consult: Follow-up   Referred by Tiana Loft, PA-C  Subjective:     HPI:  Lori Klein is a 57 y.o. female has a history of DVT and PE no longer maintained on anticoagulation.  Recently underwent bilateral total knee arthroplasty on several occasions had a filter placed prior.  She has now recovered well with no plans for future orthopedic procedures.  Past Medical History:  Diagnosis Date   Anxiety    Arthritis    Blood dyscrasia    has sickle cell trait   DVT (deep venous thrombosis) (HCC)    Headache    migraine   PONV (postoperative nausea and vomiting)    Pre-diabetes    Pulmonary embolism (HCC)    Family History  Problem Relation Age of Onset   Hypertension Mother    Hypertension Father    Diabetes Other    Past Surgical History:  Procedure Laterality Date   CHOLECYSTECTOMY  2008   laparoscopic   IVC FILTER INSERTION N/A 04/12/2022   Procedure: IVC FILTER INSERTION;  Surgeon: Maeola Harman, MD;  Location: [facility] INVASIVE CV LAB;  Service: Cardiovascular;  Laterality: N/A;   IVC VENOGRAPHY N/A 04/12/2022   Procedure: IVC Venography;  Surgeon: Maeola Harman, MD;  Location: [facility] INVASIVE CV LAB;  Service: Cardiovascular;  Laterality: N/A;   KNEE ARTHROPLASTY Left 04/21/2022   Procedure: COMPUTER ASSISTED TOTAL KNEE ARTHROPLASTY;  Surgeon: Samson Frederic, MD;  Location: [facility];  Service: Orthopedics;  Laterality: Left;   KNEE ARTHROPLASTY Right 09/15/2022   Procedure: COMPUTER ASSISTED RIGHT TOTAL KNEE ARTHROPLASTY;  Surgeon: Samson Frederic, MD;  Location: [facility];  Service: Orthopedics;  Laterality: Right;   ulnar surgery Left 2017   released compressed nerve   WRIST SURGERY Right    nerve de fragmentation    Short Social History:  Social History   Tobacco Use   Smoking status: Never   Smokeless tobacco: Never  Substance Use Topics    Alcohol use: Yes    Comment: occasionally    No Known Allergies  Current Outpatient Medications  Medication Sig Dispense Refill   atorvastatin (LIPITOR) 40 MG tablet Take 40 mg by mouth daily.     BIOTIN PO Take 1 capsule by mouth daily.     busPIRone (BUSPAR) 10 MG tablet Take 20 mg by mouth 2 (two) times daily.     Cyanocobalamin (B-12 PO) Take 1 capsule by mouth daily.     diclofenac (VOLTAREN) 75 MG EC tablet Take 75 mg by mouth 2 (two) times daily.     escitalopram (LEXAPRO) 20 MG tablet Take 20 mg by mouth daily.     Naphazoline-Pheniramine (VISINE OP) Place 1 drop into both eyes daily as needed (irritation).     ondansetron (ZOFRAN) 4 MG tablet Take 1 tablet (4 mg total) by mouth every 8 (eight) hours as needed for nausea or vomiting. 30 tablet 0   Scar Treatment Products (MEDERMA) GEL Apply 1 Application topically daily. Apply to scar on knee     SUMAtriptan (IMITREX) 100 MG tablet Take 100 mg by mouth every 2 (two) hours as needed for migraine.     VITAMIN E PO Take 1 capsule by mouth daily.     amoxicillin (AMOXIL) 500 MG tablet Take 4 tablets (2000 mg) by mouth 1 hour prior to dental appointments. (Patient not taking: Reported on 12/08/2022)  apixaban (ELIQUIS) 2.5 MG TABS tablet Take 1 tablet (2.5 mg total) by mouth 2 (two) times daily. 60 tablet 0   oxyCODONE (OXY IR/ROXICODONE) 5 MG immediate release tablet Take 1 tablet every 4 hours by mouth as needed for 7 days, for pain. (Patient not taking: Reported on 12/08/2022) 42 tablet 0   No current facility-administered medications for this visit.    Review of Systems  Constitutional:  Constitutional negative. HENT: HENT negative.  Eyes: Eyes negative.  Respiratory: Respiratory negative.  Cardiovascular: Cardiovascular negative.  GI: Gastrointestinal negative.  Musculoskeletal: Musculoskeletal negative.  Skin: Skin negative.  Neurological: Neurological negative. Hematologic: Hematologic/lymphatic negative.  Psychiatric:  Psychiatric negative.        Objective:  Objective   Vitals:   12/08/22 1556  BP: 125/81  Pulse: 77  Temp: 97.9 F (36.6 C)  SpO2: 98%     Physical Exam HENT:     Head: Normocephalic.     Nose: Nose normal.  Eyes:     Pupils: Pupils are equal, round, and reactive to light.  Cardiovascular:     Rate and Rhythm: Normal rate.     Pulses: Normal pulses.  Pulmonary:     Effort: Pulmonary effort is normal.  Abdominal:     General: Abdomen is flat.  Musculoskeletal:        General: Normal range of motion.     Cervical back: Normal range of motion and neck supple.     Right lower leg: No edema.     Left lower leg: No edema.  Skin:    General: Skin is warm.     Capillary Refill: Capillary refill takes less than 2 seconds.  Neurological:     General: No focal deficit present.     Mental Status: She is alert.  Psychiatric:        Mood and Affect: Mood normal.     Data: No studies     Assessment/Plan:    57 year old female status post IVC filter placement for history of DVT with PE and bilateral total knee arthroplasty.  She has now recovered well.  Plan will be for IVC filter retrieval.  We discussed the risk and benefits including the risk of additional procedures needed to retrieve the filter including extended time and possible risk of IVC perforation with bleeding versus inability to retrieve she demonstrates good understanding.     Maeola Harman MD [facility]

## 2022-12-14 ENCOUNTER — Other Ambulatory Visit: Payer: Self-pay

## 2022-12-14 DIAGNOSIS — Z86718 Personal history of other venous thrombosis and embolism: Secondary | ICD-10-CM

## 2023-01-03 ENCOUNTER — Ambulatory Visit (HOSPITAL_COMMUNITY)
Admission: RE | Admit: 2023-01-03 | Discharge: 2023-01-03 | Disposition: A | Discharge status: Home / Self Care | Payer: Federal, State, Local not specified - PPO | Attending: Vascular Surgery | Admitting: Vascular Surgery

## 2023-01-03 ENCOUNTER — Encounter (HOSPITAL_COMMUNITY)
Admission: RE | Disposition: A | Discharge status: Home / Self Care | Payer: Self-pay | Source: Home / Self Care | Attending: Vascular Surgery

## 2023-01-03 ENCOUNTER — Other Ambulatory Visit: Payer: Self-pay

## 2023-01-03 DIAGNOSIS — Z96653 Presence of artificial knee joint, bilateral: Secondary | ICD-10-CM | POA: Diagnosis not present

## 2023-01-03 DIAGNOSIS — Z4589 Encounter for adjustment and management of other implanted devices: Secondary | ICD-10-CM | POA: Insufficient documentation

## 2023-01-03 DIAGNOSIS — Z86711 Personal history of pulmonary embolism: Secondary | ICD-10-CM | POA: Diagnosis not present

## 2023-01-03 DIAGNOSIS — Z86718 Personal history of other venous thrombosis and embolism: Secondary | ICD-10-CM | POA: Insufficient documentation

## 2023-01-03 HISTORY — PX: IVC FILTER REMOVAL: CATH118246

## 2023-01-03 LAB — POCT I-STAT, CHEM 8
BUN: 25 mg/dL — ABNORMAL HIGH (ref 6–20)
Calcium, Ion: 1.25 mmol/L (ref 1.15–1.40)
Chloride: 103 mmol/L (ref 98–111)
Creatinine, Ser: 1 mg/dL (ref 0.44–1.00)
Glucose, Bld: 100 mg/dL — ABNORMAL HIGH (ref 70–99)
HCT: 39 % (ref 36.0–46.0)
Hemoglobin: 13.3 g/dL (ref 12.0–15.0)
Potassium: 4.4 mmol/L (ref 3.5–5.1)
Sodium: 143 mmol/L (ref 135–145)
TCO2: 30 mmol/L (ref 22–32)

## 2023-01-03 SURGERY — IVC FILTER REMOVAL
Anesthesia: LOCAL

## 2023-01-03 MED ORDER — SODIUM CHLORIDE 0.9 % IV SOLN: INTRAVENOUS | Status: DC

## 2023-01-03 MED ORDER — FENTANYL CITRATE (PF) 100 MCG/2ML IJ SOLN
INTRAMUSCULAR | Status: AC
  Filled 2023-01-03: qty 2

## 2023-01-03 MED ORDER — LIDOCAINE HCL (PF) 1 % IJ SOLN
INTRAMUSCULAR | Status: AC
  Filled 2023-01-03: qty 30

## 2023-01-03 MED ORDER — HEPARIN (PORCINE) IN NACL 1000-0.9 UT/500ML-% IV SOLN
INTRAVENOUS | Status: DC | PRN
  Administered 2023-01-03: 500 mL

## 2023-01-03 MED ORDER — FENTANYL CITRATE (PF) 100 MCG/2ML IJ SOLN
INTRAMUSCULAR | Status: DC | PRN
  Administered 2023-01-03: 25 ug via INTRAVENOUS

## 2023-01-03 MED ORDER — MIDAZOLAM HCL 2 MG/2ML IJ SOLN
INTRAMUSCULAR | Status: DC | PRN
  Administered 2023-01-03: 2 mg via INTRAVENOUS

## 2023-01-03 MED ORDER — LIDOCAINE HCL (PF) 1 % IJ SOLN
INTRAMUSCULAR | Status: DC | PRN
  Administered 2023-01-03: 20 mL

## 2023-01-03 MED ORDER — MIDAZOLAM HCL 2 MG/2ML IJ SOLN
INTRAMUSCULAR | Status: AC
Start: 2023-01-03 — End: ?
  Filled 2023-01-03: qty 2

## 2023-01-03 MED ORDER — IODIXANOL 320 MG/ML IV SOLN
INTRAVENOUS | Status: DC | PRN
  Administered 2023-01-03: 10 mL

## 2023-01-03 MED ORDER — IOHEXOL 350 MG/ML SOLN: INTRAVENOUS | Status: DC | PRN

## 2023-01-03 SURGICAL SUPPLY — 6 items
KIT MICROPUNCTURE NIT STIFF (SHEATH) IMPLANT
KIT PV (KITS) ×2 IMPLANT
SET CLOVERSNARE FLT RETRIEVAL (MISCELLANEOUS) IMPLANT
TRANSDUCER W/STOPCOCK (MISCELLANEOUS) ×1 IMPLANT
TRAY PV CATH (CUSTOM PROCEDURE TRAY) ×1 IMPLANT
WIRE BENTSON .035X145CM (WIRE) IMPLANT

## 2023-01-03 NOTE — Op Note (Signed)
    Patient name: Lori Klein MRN: [id number] DOB: 11-27-1965 Sex: female  01/03/2023 Pre-operative Diagnosis: History of IVC filter placement Post-operative diagnosis:  Same Surgeon:  Luanna Salk. Randie Heinz, MD Procedure Performed: 1.  Ultrasound guided cannulation right internal jugular vein 2.  Removal of IVC filter 3.  IVC venogram 4.  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  Plan now is for IVC filter removal.  Findings: The IVC filter was midline we were able to remove the filter and it was noted to be intact and at completion the IVC had no active extravasation.   Procedure:  The patient was identified in the holding area and taken to room 8.  The patient was then placed supine on the table and prepped and draped in the usual sterile fashion.  A time out was called.  Ultrasound was used to evaluate the right internal jugular vein which was patent and compressible.  Moderate sedation with fentanyl and Versed was administered vital signs were monitored throughout the case.  Using ultrasound we cannulated the right IJ with micropuncture needle followed by wire and the sheath.  A Bentson wire was placed into the IVC and the retrieval sheath was placed under fluoroscopic guidance.  The filter was then snared and removed entirely.  Completion venogram demonstrated no active extravasation.  Sheath was removed pressure held till hemostasis obtained.  She tolerated the procedure without any complication.   Contrast: 10 cc  Divine Imber C. Randie Heinz, MD [facility] Office: [phone] Pager: [phone]

## 2023-01-03 NOTE — Interval H&P Note (Signed)
History and Physical Interval Note:  01/03/2023 7:14 AM  Lori Klein  has presented today for surgery, with the diagnosis of history of dvt - presents of IVC filter.  The various methods of treatment have been discussed with the patient and family. After consideration of risks, benefits and other options for treatment, the patient has consented to  Procedure(s): IVC FILTER REMOVAL (N/A) as a surgical intervention.  The patient's history has been reviewed, patient examined, no change in status, stable for surgery.  I have reviewed the patient's chart and labs.  Questions were answered to the patient's satisfaction.     Lemar Livings

## 2023-01-04 ENCOUNTER — Encounter (HOSPITAL_COMMUNITY): Payer: Self-pay | Admitting: Vascular Surgery

## 2023-02-16 ENCOUNTER — Other Ambulatory Visit: Payer: Self-pay | Admitting: Medical Genetics

## 2023-03-18 ENCOUNTER — Other Ambulatory Visit (HOSPITAL_COMMUNITY)
Admission: RE | Admit: 2023-03-18 | Discharge: 2023-03-18 | Disposition: A | Discharge status: Home / Self Care | Payer: Self-pay | Source: Ambulatory Visit | Attending: Medical Genetics | Admitting: Medical Genetics

## 2023-03-30 LAB — GENECONNECT MOLECULAR SCREEN: Genetic Analysis Overall Interpretation: NEGATIVE

## 2024-02-16 ENCOUNTER — Other Ambulatory Visit (HOSPITAL_BASED_OUTPATIENT_CLINIC_OR_DEPARTMENT_OTHER): Payer: Self-pay

## 2024-02-16 MED ORDER — FLUTICASONE PROPIONATE 50 MCG/ACT NA SUSP
NASAL | 0 refills | Status: AC
  Filled 2024-02-16: qty 16, 30d supply, fill #0
